# Patient Record
Sex: Female | Born: 1985 | Race: Black or African American | Hispanic: No | Marital: Single | Smoking: Never smoker
Health system: Southern US, Community
[De-identification: ages and names within clinical notes are randomized; demographics above are authoritative.]

## PROBLEM LIST (undated history)

## (undated) DIAGNOSIS — R87619 Unspecified abnormal cytological findings in specimens from cervix uteri: Secondary | ICD-10-CM

## (undated) DIAGNOSIS — R011 Cardiac murmur, unspecified: Secondary | ICD-10-CM

## (undated) DIAGNOSIS — R51 Headache: Secondary | ICD-10-CM

## (undated) DIAGNOSIS — O24419 Gestational diabetes mellitus in pregnancy, unspecified control: Secondary | ICD-10-CM

## (undated) HISTORY — DX: Headache: R51

## (undated) HISTORY — DX: Unspecified abnormal cytological findings in specimens from cervix uteri: R87.619

## (undated) HISTORY — DX: Cardiac murmur, unspecified: R01.1

## (undated) HISTORY — PX: DILATION AND CURETTAGE OF UTERUS: SHX78

---
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
## 2005-02-16 ENCOUNTER — Emergency Department: Payer: Self-pay | Admitting: Emergency Medicine

## 2005-04-17 ENCOUNTER — Ambulatory Visit: Payer: Self-pay

## 2005-10-18 ENCOUNTER — Emergency Department (HOSPITAL_COMMUNITY): Admission: EM | Admit: 2005-10-18 | Discharge: 2005-10-18 | Payer: Self-pay | Admitting: Emergency Medicine

## 2006-09-24 ENCOUNTER — Emergency Department (HOSPITAL_COMMUNITY): Admission: EM | Admit: 2006-09-24 | Discharge: 2006-09-25 | Payer: Self-pay | Admitting: Emergency Medicine

## 2006-09-25 IMAGING — US US OB COMP LESS 14 WK
1 series · 14 of 28 positions shown · non-contrast
Comparison: none

CLINICAL DATA: Abdominal pain, positive pregnancy test

TRANSABDOMINAL AND TRANSVAGINAL OB ULTRASOUND, LESS THAN 14 WEEKS:

[Series 1: unknown · 0.32mm/px · 14 of 42 slices shown]
[im 2/42]
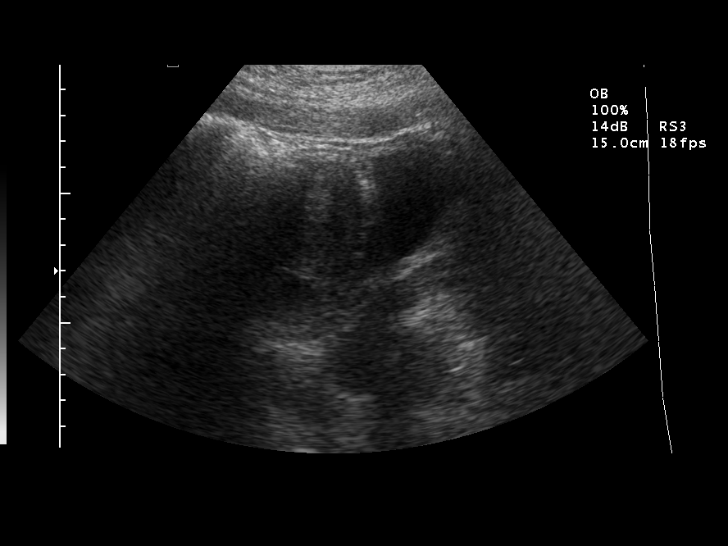
[im 5/42]
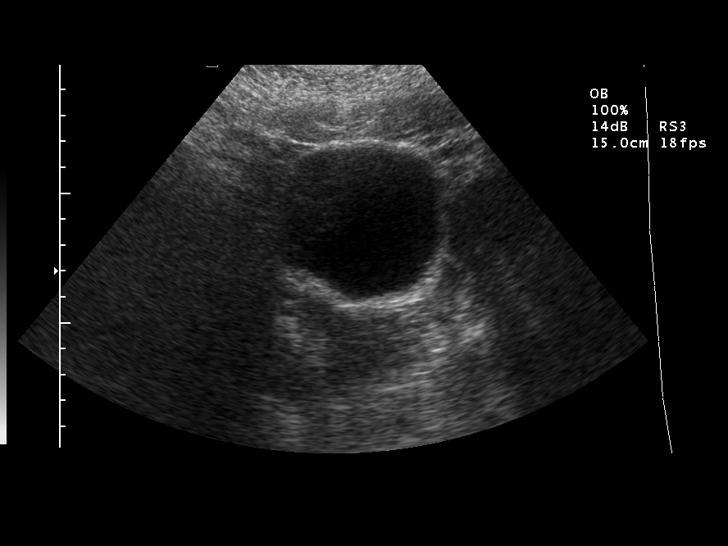
[im 8/42]
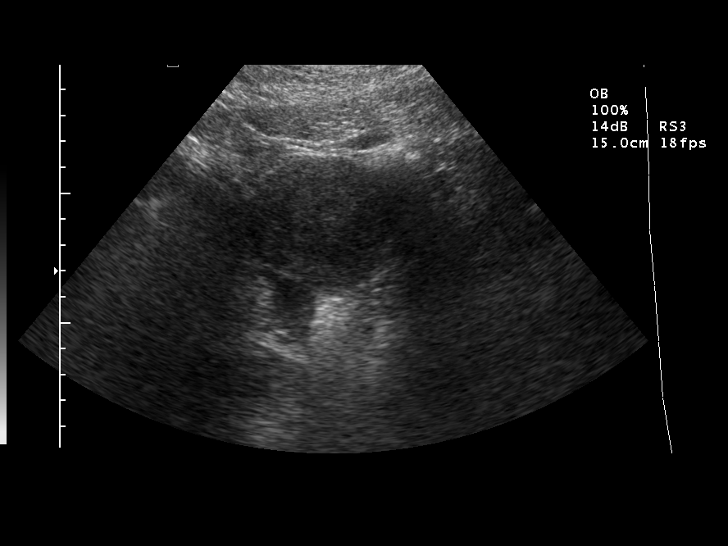
[im 11/42]
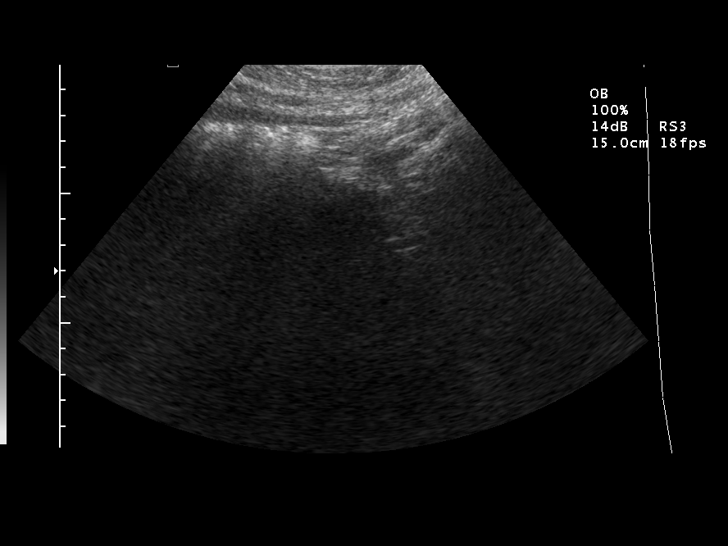
[im 14/42]
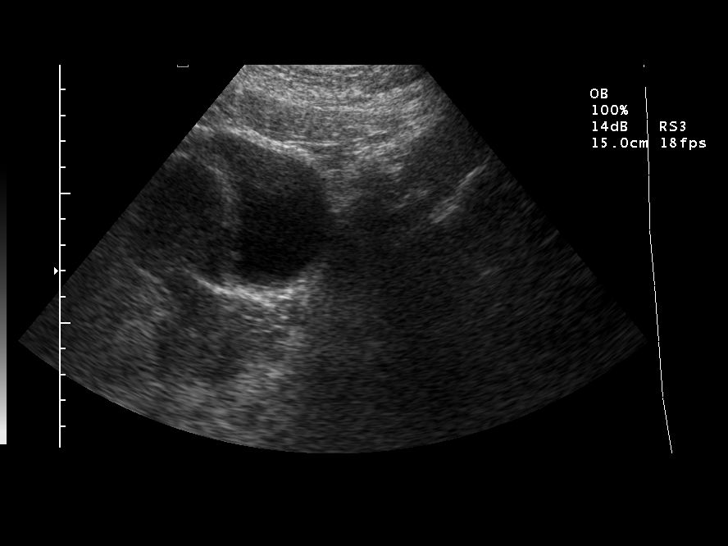
[im 17/42]
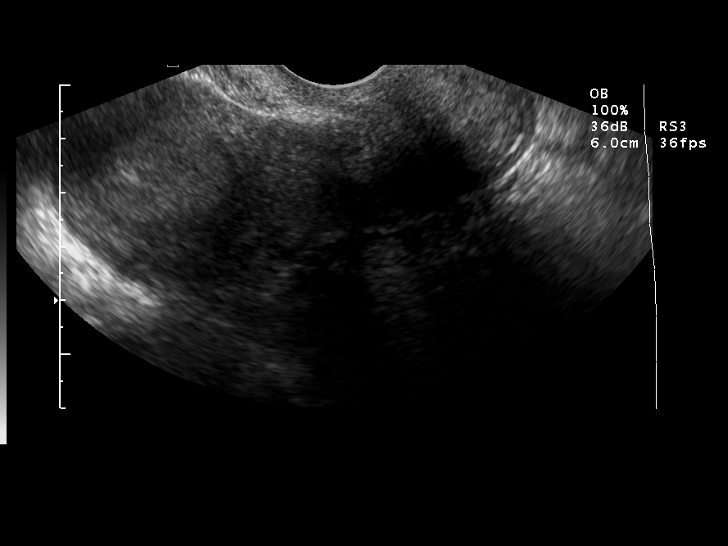
[im 20/42]
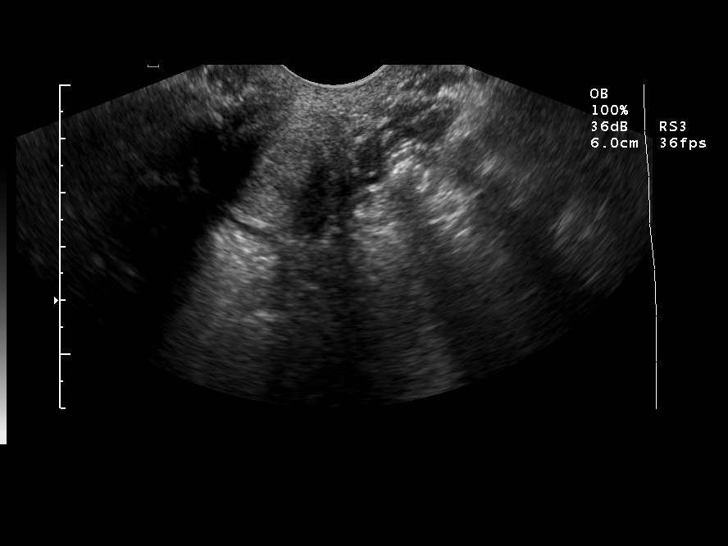
[im 23/42]
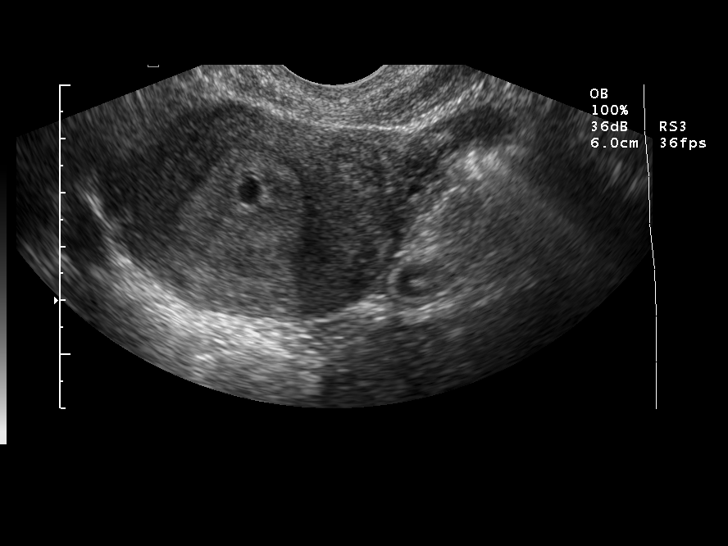
[im 26/42]
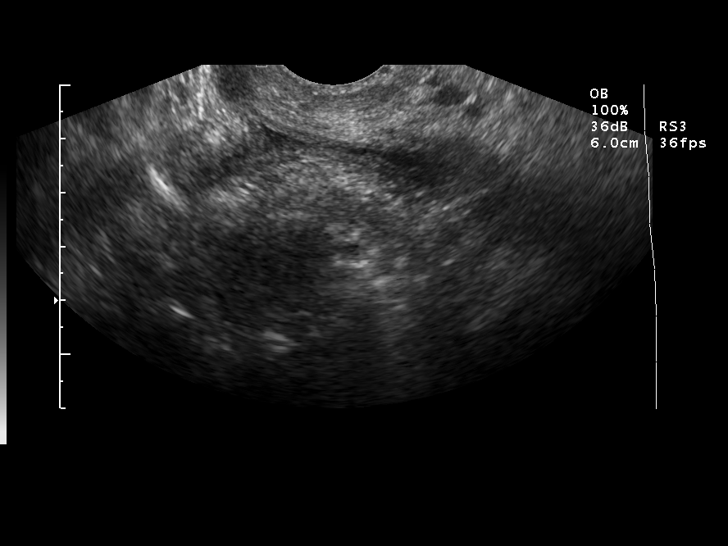
[im 29/42]
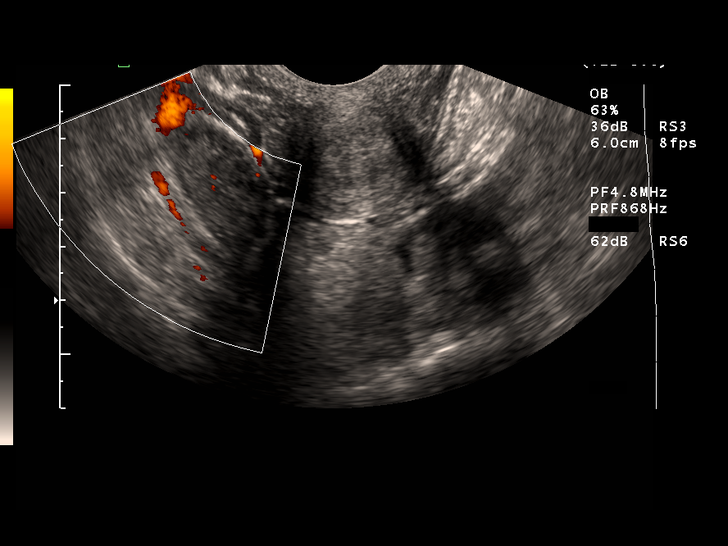
[im 32/42]
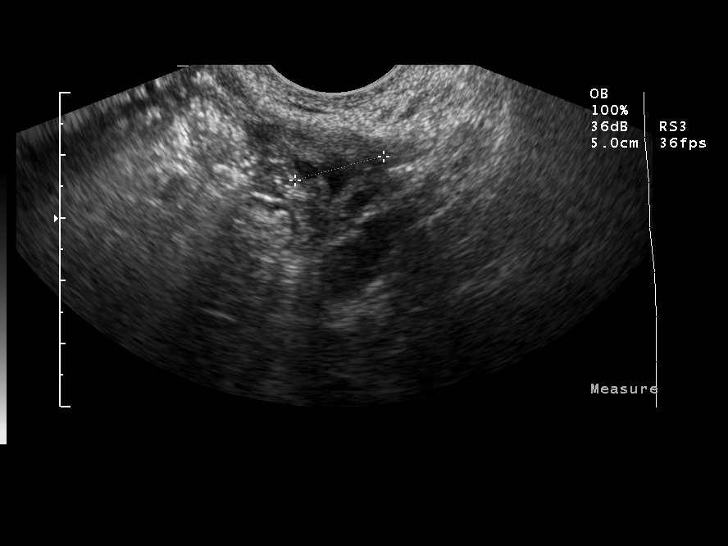
[im 35/42]
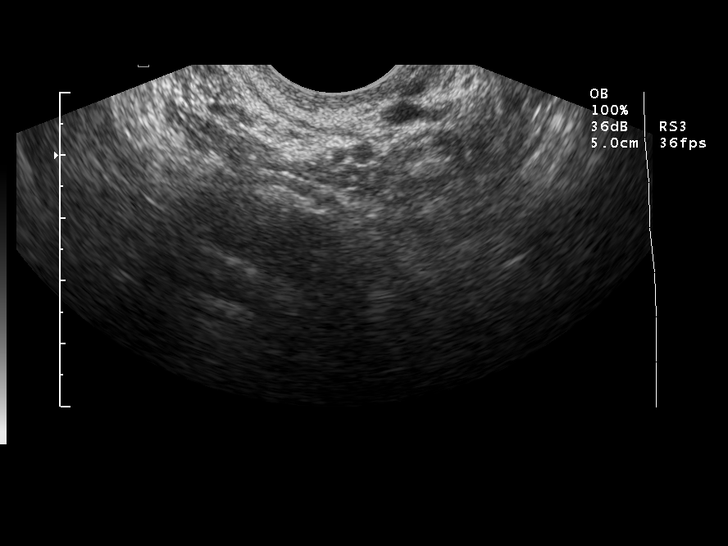
[im 38/42]
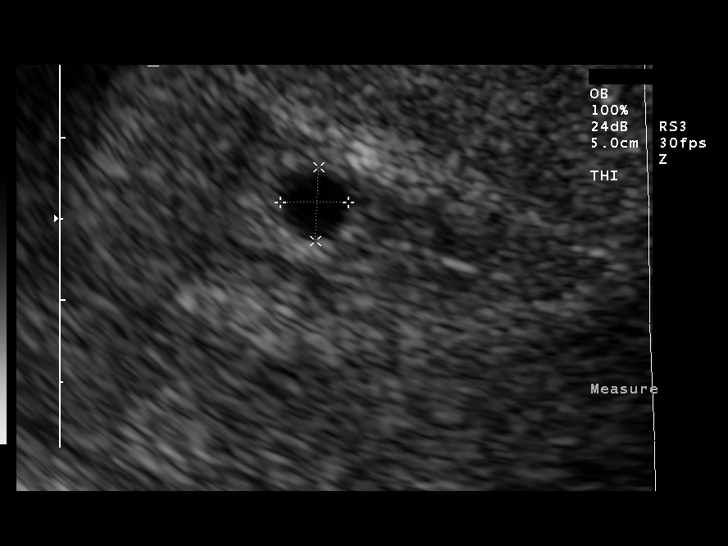
[im 42/42]
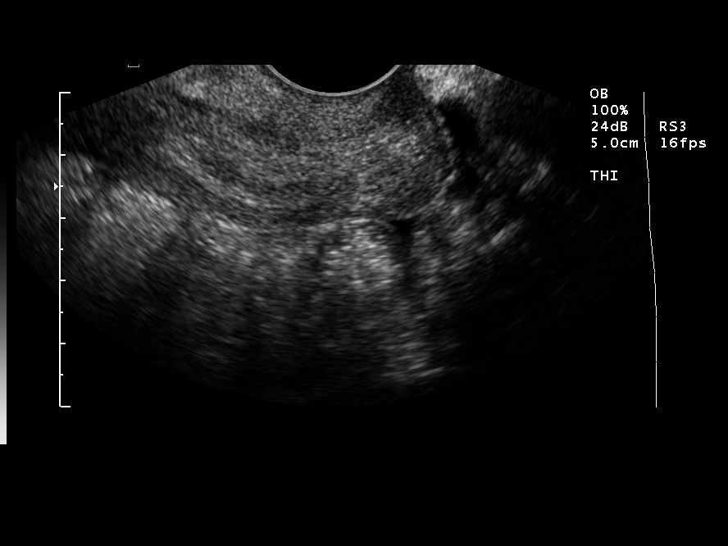

[14 of 28 positions shown; findings below may reference images not displayed]

FINDINGS: There is an early intrauterine pregnancy. Mean sac diameter is 4.4 mm
for an estimated gestational age of 5 weeks 1 day. Yolk sac is noted. No fetal
pole present at this time. 

Small left corpus luteum cyst. Small amount of free fluid in the pelvis.
IMPRESSION: Early intrauterine pregnancy with an estimated gestational age of 5 weeks one
day. Yolk sac present. No fetal pole.

## 2006-10-31 ENCOUNTER — Inpatient Hospital Stay (HOSPITAL_COMMUNITY): Admission: AD | Admit: 2006-10-31 | Discharge: 2006-11-01 | Payer: Self-pay | Admitting: Obstetrics and Gynecology

## 2006-11-01 IMAGING — US US OB TRANSVAGINAL
1 series · 14 of 28 positions shown · non-contrast
Comparison: none

CLINICAL DATA: 20-year-old female with pelvic pain.  Reported ultrasound in office with no fetal heart tones 4 days ago.  Estimated gestation of 10 weeks 3 days by prior ultrasound.
TRANSVAGINAL OBSTETRICAL US:
TECHNIQUE: Transvaginal ultrasound was performed for evaluation of the gestation as well as the maternal uterus and adnexal regions.

[Series 1: us ob transvaginal · 0.17mm/px · 39 acquisitions, 14 frames shown]
[im 2/39]
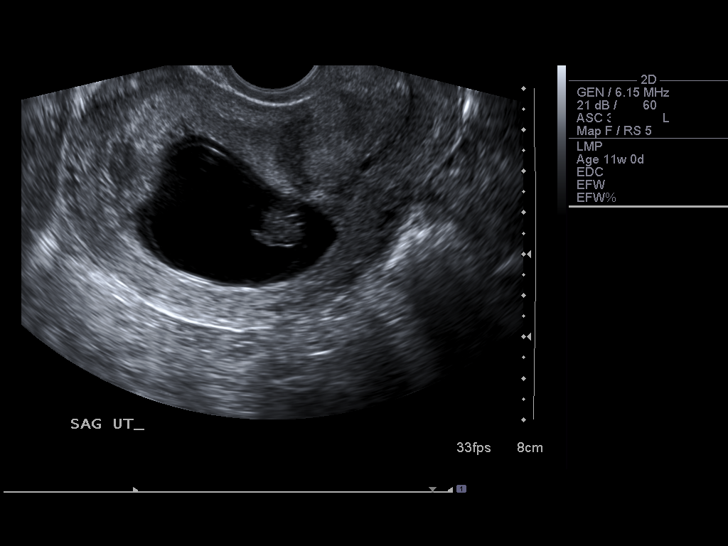
[im 5/39]
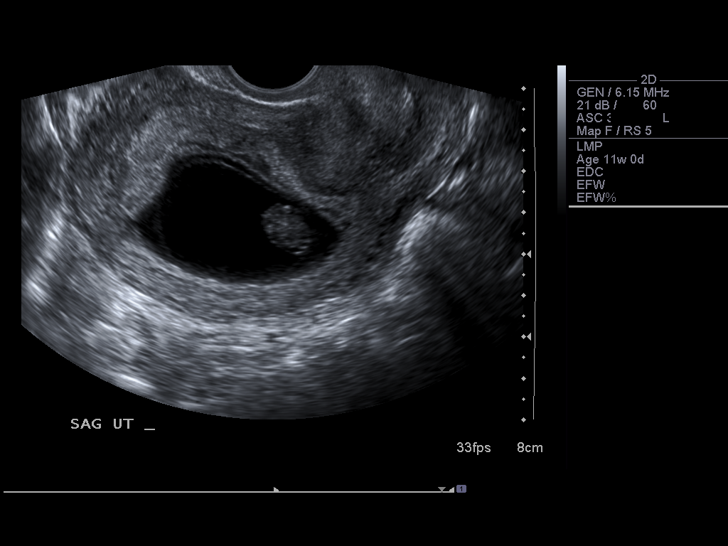
[im 8/39]
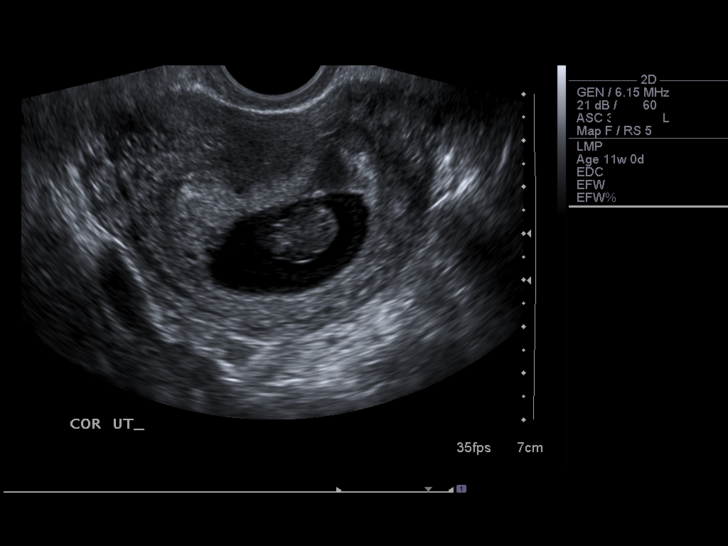
[im 10/39]
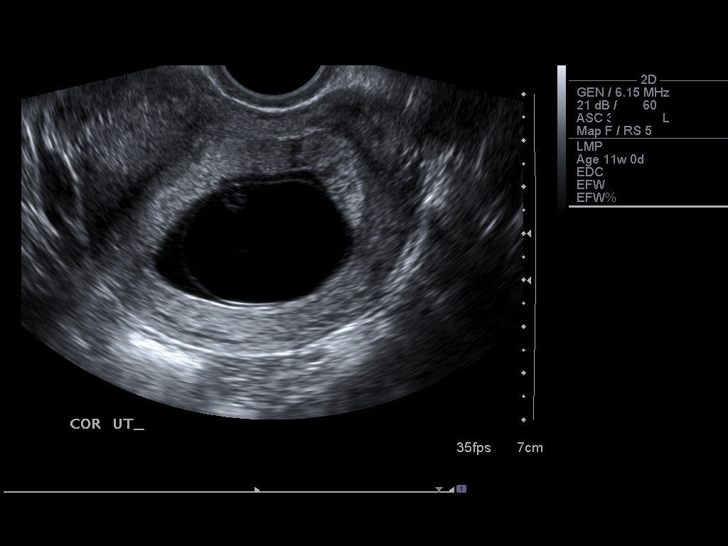
[im 13/39]
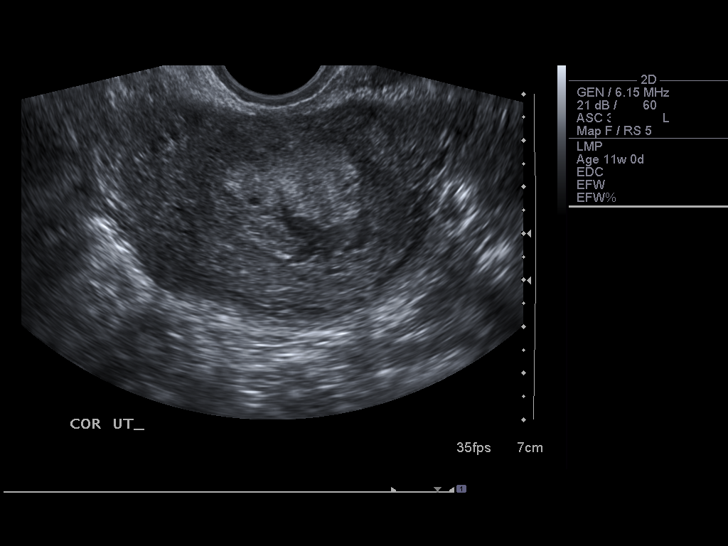
[im 16/39]
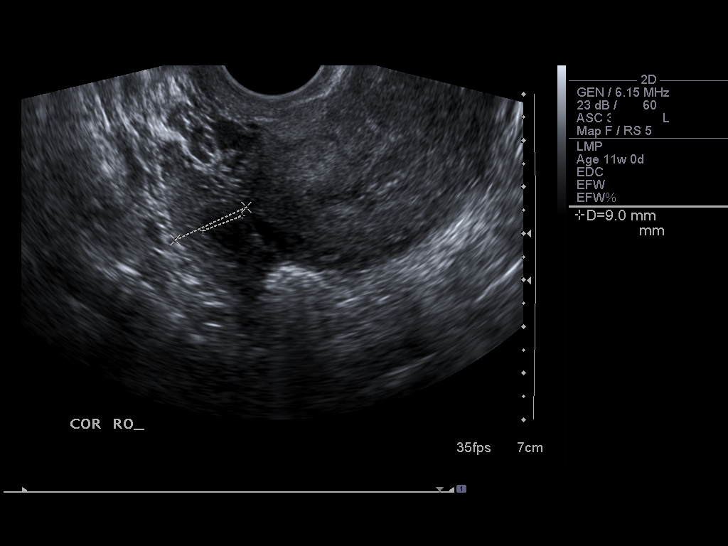
[im 19/39]
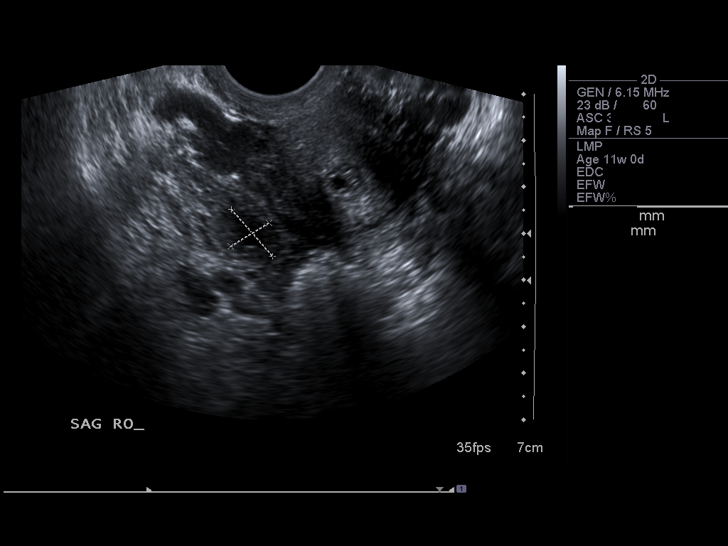
[im 22/39]
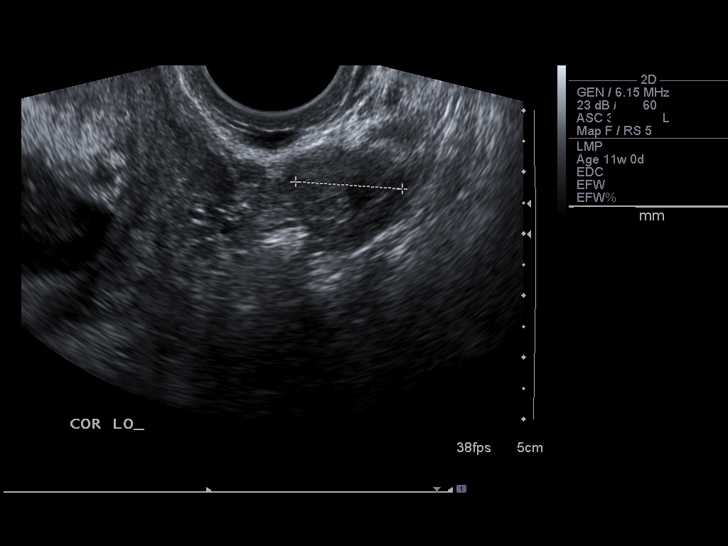
[im 24/39]
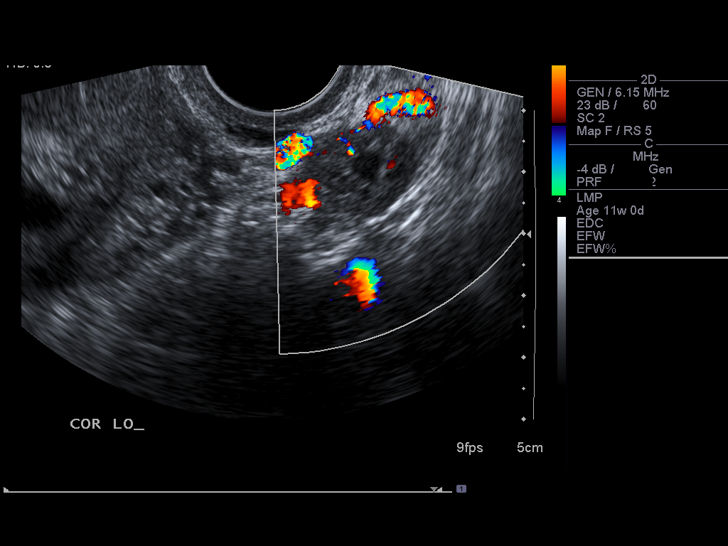
[im 27/39]
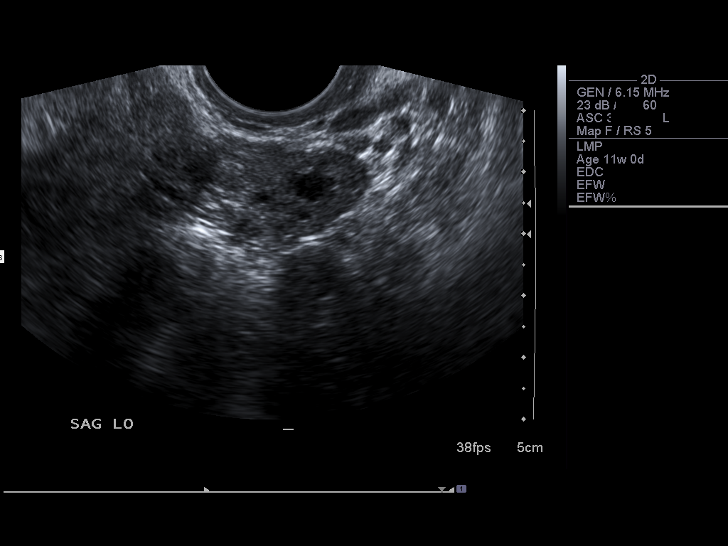
[im 30/39]
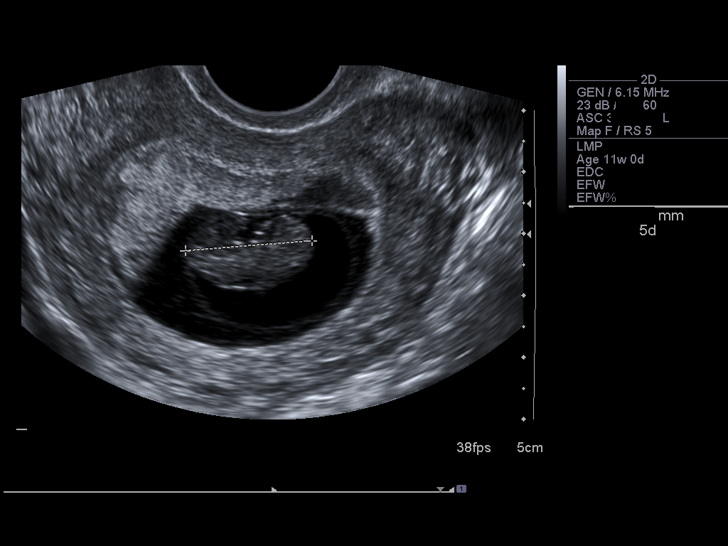
[im 33/39]
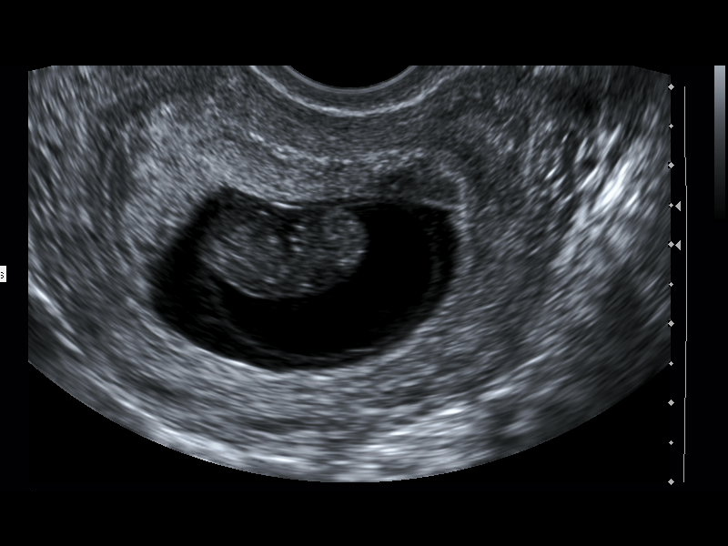
[im 36/39]
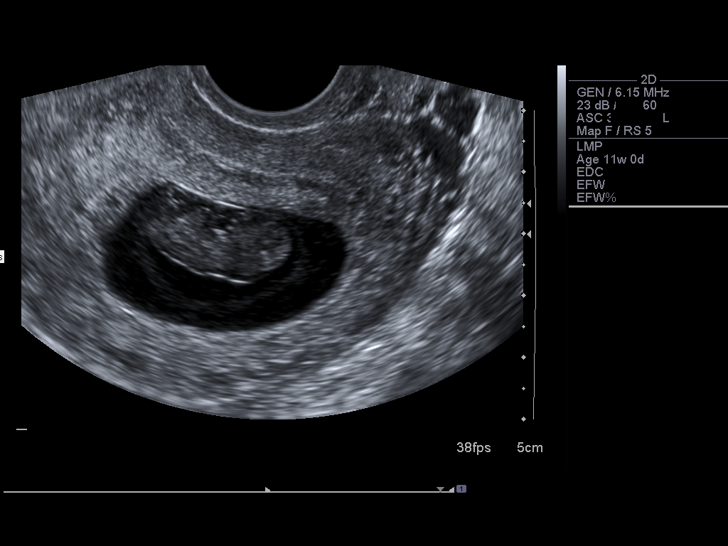
[im 39/39]
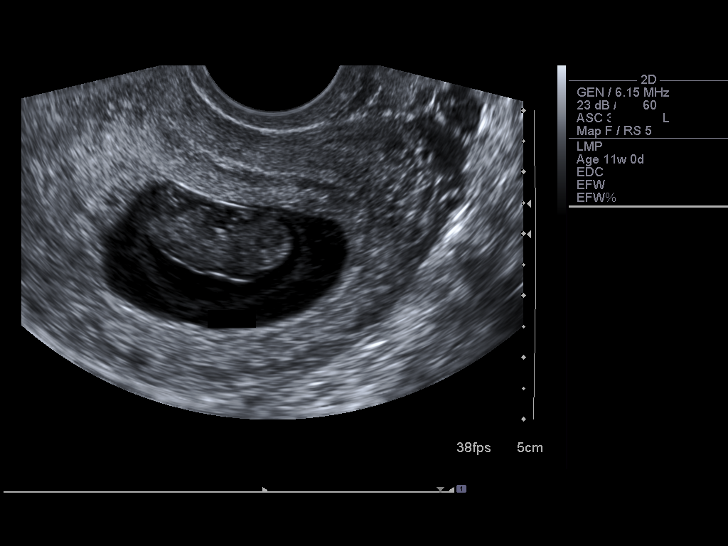

[14 of 28 positions shown; findings below may reference images not displayed]

FINDINGS: There is a single intrauterine gestational sac with yolk sac and fetal pole noted.  Crown-rump length measures 2.3 cm corresponding to a gestational age of 9 weeks 0 days.  However, there is no cardiac activity noted, compatible with fetal demise.  The ovaries are unremarkable.  No free fluid or adnexal masses.
IMPRESSION: Fetal demise.

## 2006-11-05 ENCOUNTER — Encounter (INDEPENDENT_AMBULATORY_CARE_PROVIDER_SITE_OTHER): Payer: Self-pay | Admitting: Specialist

## 2006-11-05 ENCOUNTER — Ambulatory Visit (HOSPITAL_COMMUNITY): Admission: RE | Admit: 2006-11-05 | Discharge: 2006-11-05 | Payer: Self-pay | Admitting: Obstetrics and Gynecology

## 2006-12-12 ENCOUNTER — Emergency Department (HOSPITAL_COMMUNITY): Admission: EM | Admit: 2006-12-12 | Discharge: 2006-12-12 | Payer: Self-pay | Admitting: Emergency Medicine

## 2007-01-31 ENCOUNTER — Encounter (INDEPENDENT_AMBULATORY_CARE_PROVIDER_SITE_OTHER): Payer: Self-pay | Admitting: *Deleted

## 2007-03-19 ENCOUNTER — Ambulatory Visit (HOSPITAL_COMMUNITY): Admission: RE | Admit: 2007-03-19 | Discharge: 2007-03-19 | Payer: Self-pay | Admitting: Cardiology

## 2007-03-19 ENCOUNTER — Encounter: Payer: Self-pay | Admitting: Cardiology

## 2007-08-03 ENCOUNTER — Emergency Department (HOSPITAL_COMMUNITY): Admission: EM | Admit: 2007-08-03 | Discharge: 2007-08-03 | Payer: Self-pay | Admitting: Emergency Medicine

## 2007-09-26 ENCOUNTER — Inpatient Hospital Stay (HOSPITAL_COMMUNITY): Admission: AD | Admit: 2007-09-26 | Discharge: 2007-09-28 | Payer: Self-pay | Admitting: Obstetrics and Gynecology

## 2007-09-29 ENCOUNTER — Encounter: Admission: RE | Admit: 2007-09-29 | Discharge: 2007-10-13 | Payer: Self-pay | Admitting: Obstetrics and Gynecology

## 2007-12-15 ENCOUNTER — Emergency Department (HOSPITAL_COMMUNITY): Admission: EM | Admit: 2007-12-15 | Discharge: 2007-12-16 | Payer: Self-pay | Admitting: Emergency Medicine

## 2008-05-10 ENCOUNTER — Emergency Department (HOSPITAL_COMMUNITY): Admission: EM | Admit: 2008-05-10 | Discharge: 2008-05-10 | Payer: Self-pay | Admitting: Emergency Medicine

## 2008-07-06 ENCOUNTER — Emergency Department (HOSPITAL_COMMUNITY): Admission: EM | Admit: 2008-07-06 | Discharge: 2008-07-06 | Payer: Self-pay | Admitting: Emergency Medicine

## 2008-11-04 ENCOUNTER — Emergency Department (HOSPITAL_COMMUNITY): Admission: EM | Admit: 2008-11-04 | Discharge: 2008-11-04 | Payer: Self-pay | Admitting: Emergency Medicine

## 2009-02-21 ENCOUNTER — Emergency Department (HOSPITAL_COMMUNITY): Admission: EM | Admit: 2009-02-21 | Discharge: 2009-02-21 | Payer: Self-pay | Admitting: Emergency Medicine

## 2009-02-21 IMAGING — CR DG ANKLE COMPLETE 3+V*R*
3 series · 3 of 3 positions shown · non-contrast
Comparison: [DATE]

CLINICAL DATA: The lateral ankle pain

RIGHT ANKLE - COMPLETE 3+ VIEW

[t ankle joint ap right]
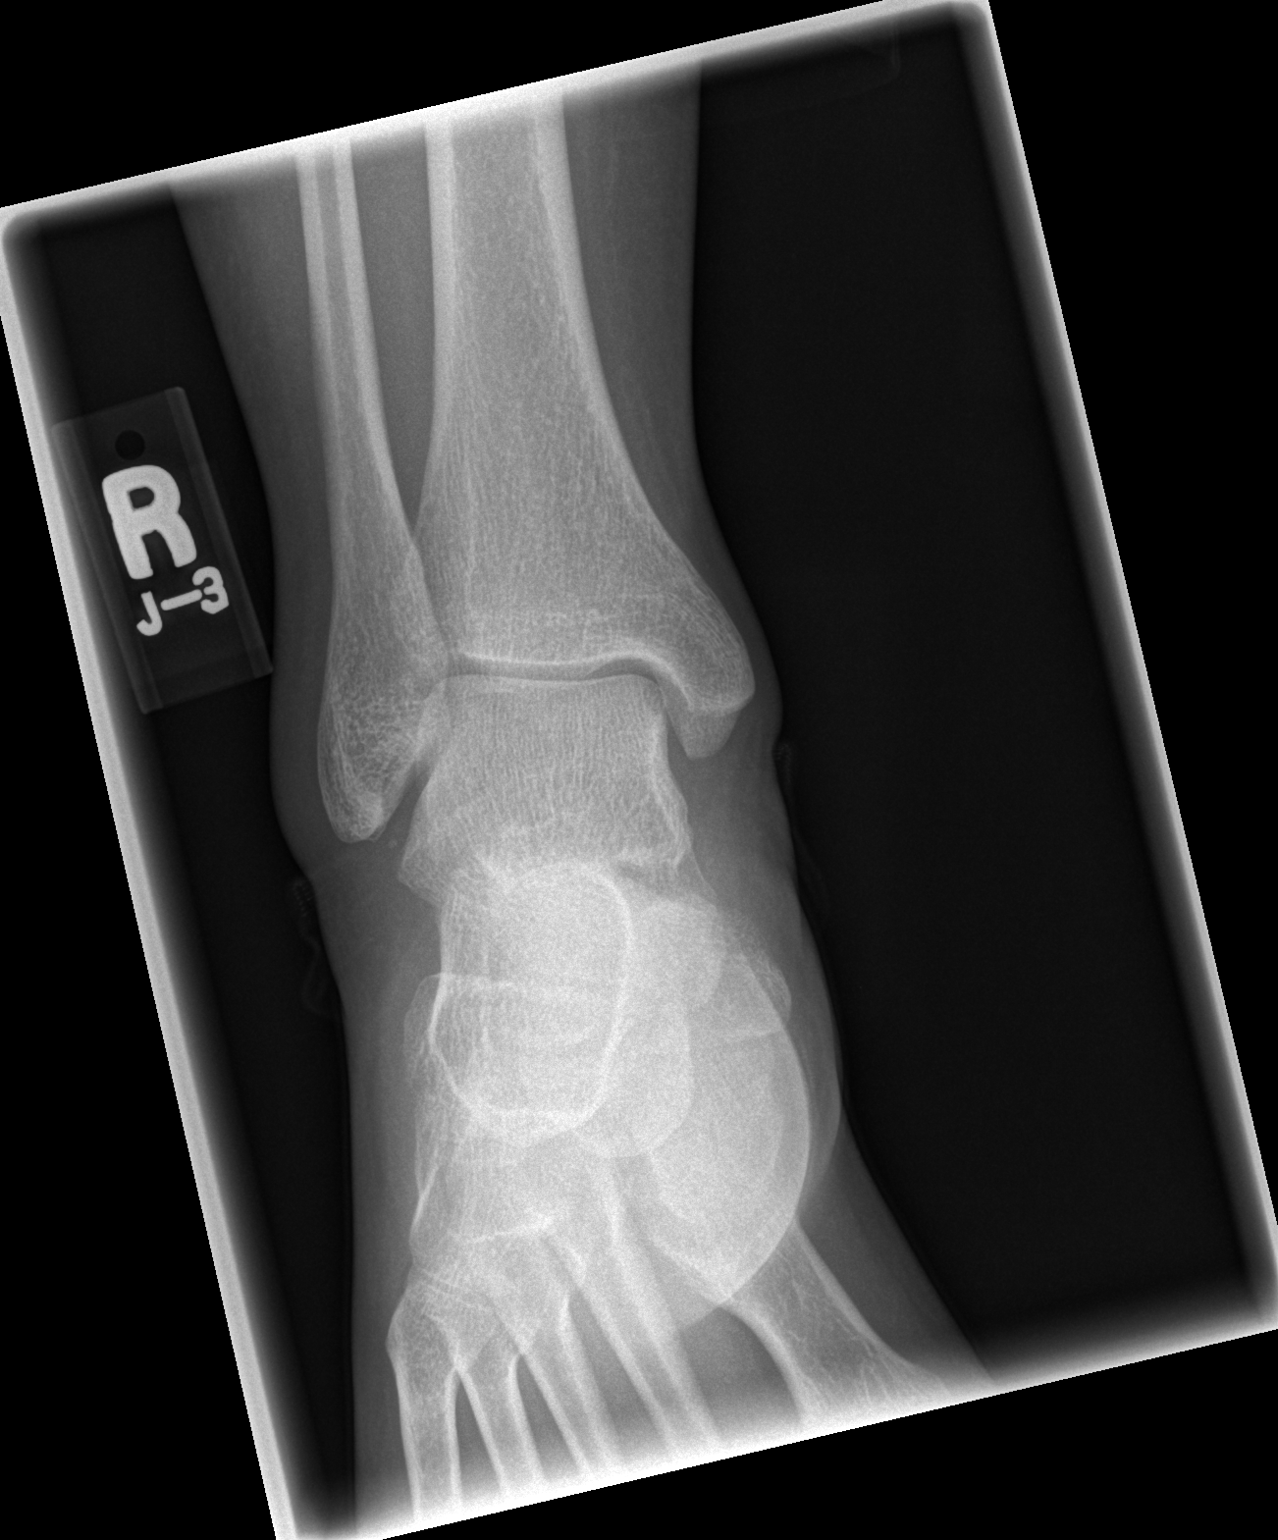

[t ankle joint oblique right]
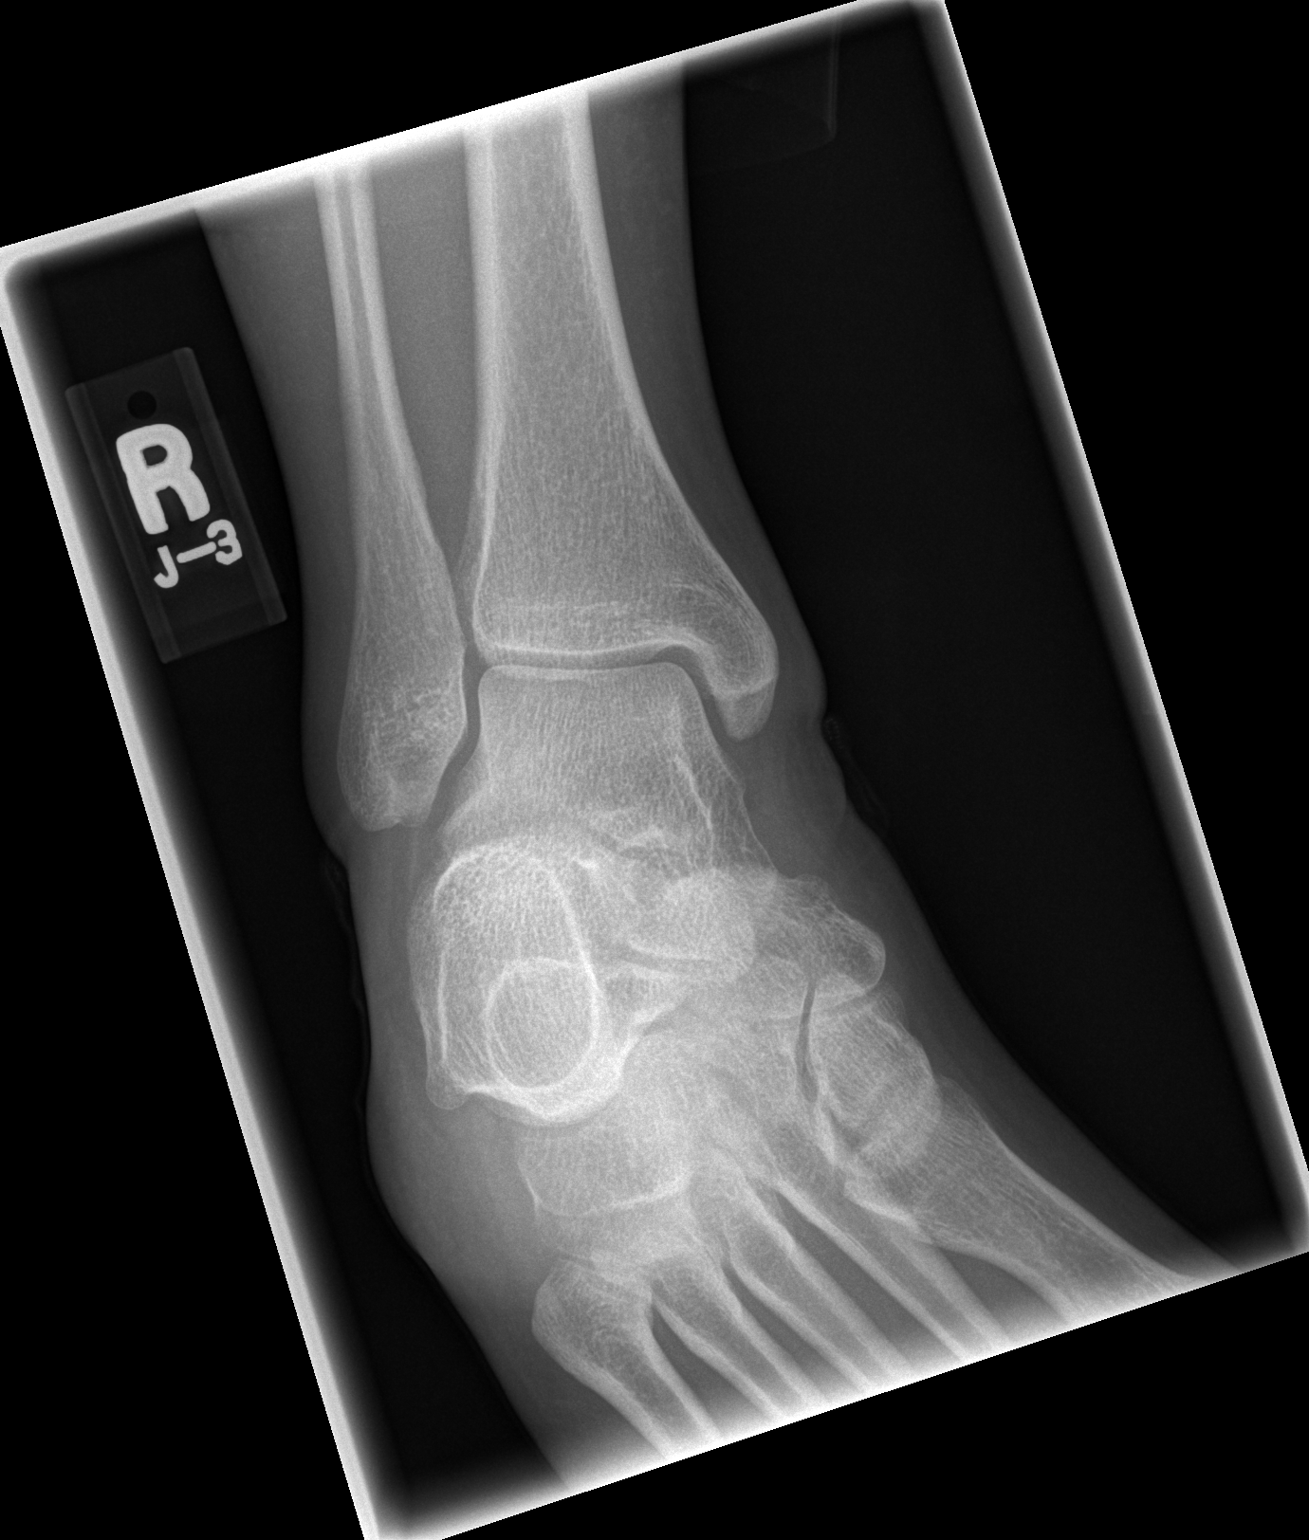

[t ankle joint lat right]
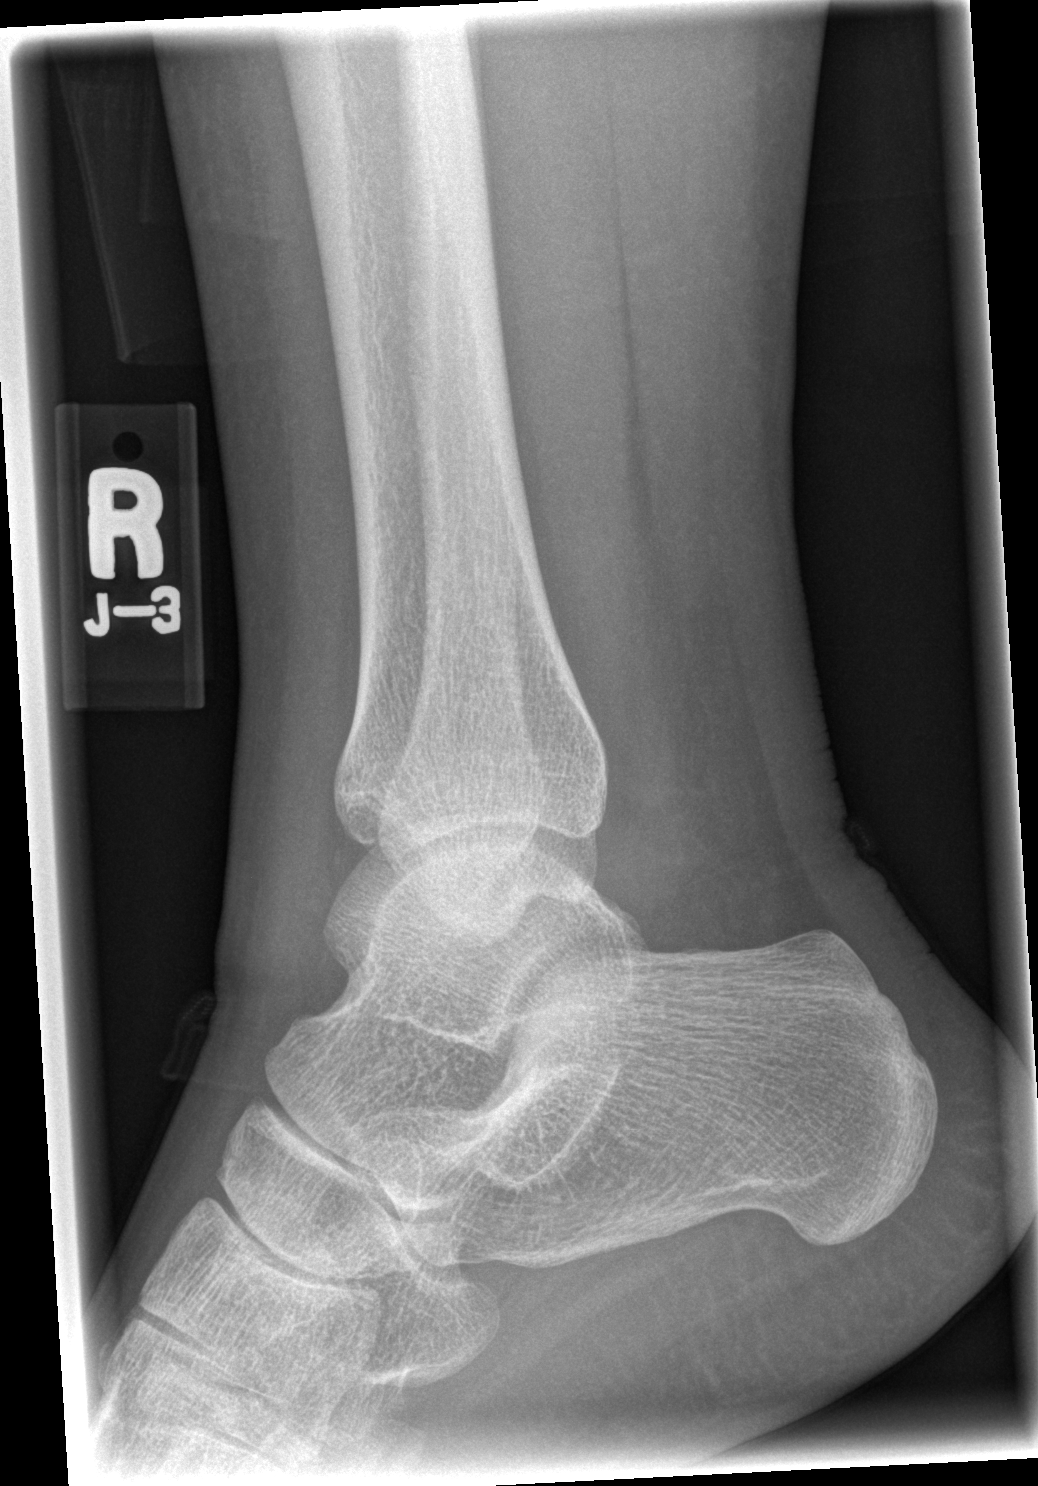

[3 of 3 positions shown; findings below may reference images not displayed]

FINDINGS: There is lateral soft tissue swelling.  No fracture or
dislocation.  There is a small accessory ossicle noted between the
tip of the fibula and the talus on the AP view.  This was present
previously.
IMPRESSION: 1.  Lateral soft tissue swelling.
2.  Small accessory ossicle noted between the tip of the fibula and
the talus as before.
3.  No acute findings or interval change.

## 2010-01-25 ENCOUNTER — Ambulatory Visit: Payer: Self-pay | Admitting: Internal Medicine

## 2010-02-03 ENCOUNTER — Emergency Department (HOSPITAL_COMMUNITY): Admission: EM | Admit: 2010-02-03 | Discharge: 2010-02-04 | Payer: Self-pay | Admitting: Emergency Medicine

## 2010-02-07 ENCOUNTER — Encounter: Admission: RE | Admit: 2010-02-07 | Discharge: 2010-03-01 | Payer: Self-pay | Admitting: Family Medicine

## 2011-04-20 NOTE — H&P (Signed)
NAMEEDUARDA, SCRIVENS NO.:  [id number]   MEDICAL RECORD NO.:  [id number]          PATIENT TYPE:  AMB   LOCATION:  [facility]                           FACILITY:  [facility]   PHYSICIAN:  Janine Limbo, M.D.DATE OF BIRTH:  [birth year]   DATE OF ADMISSION:  DATE OF DISCHARGE:                              HISTORY & PHYSICAL   HISTORY OF PRESENT ILLNESS:  Ms. [name] [name] is a 25 year old female, gravida  1, para 0, who presents at [redacted] weeks gestation (EDC is 05/23/2007).  The  patient has been followed at the [facility] and  [facility].  The patient has a  missed abortion.  She had an ultrasound performed on October 28, 2006  that showed a gestational sac that measured 9 weeks size, but no fetal  heart tones were seen.  A repeat ultrasound was performed 3 days later,  and similar findings were noted.  The patient wishes at this time to  proceed with dilatation and evacuation. She does have some spotting and  cramping.   DRUG ALLERGIES:  NO KNOWN DRUG ALLERGIES.   PAST MEDICAL HISTORY:  The patient denies hypertension and diabetes.  She did have surgery on her foot in 2005.   SOCIAL HISTORY:  The patient denies cigarette use, alcohol use and  recreational drug use.   REVIEW OF SYSTEMS:  Noncontributory.   FAMILY HISTORY:  There is a family history of chronic hypertension,  diabetes, and hypothyroidism.   PHYSICAL EXAMINATION:  VITAL SIGNS:  Weight is 137 pounds.  HEENT:  Within normal limits.  CHEST:  Clear.  HEART:  Regular rate and rhythm.  BREASTS:  Without masses.  ABDOMEN:  Nontender.  EXTREMITIES:  Grossly normal.  NEUROLOGIC:  Exam is grossly normal.  PELVIC:  External genitalia is normal.  Vagina is normal.  Cervix is  normal.  Uterus is 9-10 week size.  In the adnexa, no masses are  appreciated.   ASSESSMENT:  First trimester missed abortion.   PLAN:  The patient will undergo a dilatation and evacuation.   The  patient understands the indications for her surgical procedure, and she  accepts the risk of, but not limited to, anesthetic complications,  bleeding, infections and possible damage to the surrounding organs.      Janine Limbo, M.D.  Electronically Signed     AVS/MEDQ  D:  11/04/2006  T:  11/04/2006  Job:  [id number]

## 2011-04-20 NOTE — Op Note (Signed)
NAMEJAN, [name] [name]                   ACCOUNT NO.:  [id number]   MEDICAL RECORD NO.:  [id number]          PATIENT TYPE:  AMB   LOCATION:  [facility]C                           FACILITY:  [facility]   PHYSICIAN:  Janine Limbo, M.D.DATE OF BIRTH:  1986-08-01   DATE OF PROCEDURE:  11/05/2006  DATE OF DISCHARGE:                               OPERATIVE REPORT   PREOPERATIVE DIAGNOSIS:  First trimester missed abortion.   POSTOPERATIVE DIAGNOSIS:  First trimester missed abortion.   PROCEDURE:  Suction dilatation and evacuation.   SURGEON:  Janine Limbo, M.D.   ANESTHESIA:  Monitored anesthesia control, paracervical block using 0.5%  Marcaine with epinephrine.   DISPOSITION:  [name] is a 25 year old female, gravida 1, para 0, who  presents with a first trimester missed abortion.  The patient  understands the indications for her surgical procedure and she accepts  the risks of, but not limited to, anesthetic complications, bleeding,  infection, and possible damage to surrounding organs.   FINDINGS:  The patient's blood type is O+.  The patient had a ten week  size uterus.  A moderate amount of products of conception were removed  without difficulty.   DESCRIPTION OF PROCEDURE:  The patient was taken to the operating room  where she was given medication through her IV line.  The patient's  abdomen, perineum, and vagina were prepped with multiple layers of  Betadine.  The bladder was drained of urine.  Examination under  anesthesia was performed.  The patient was sterilely draped.  A  paracervical block was placed using 10 mL of 0.5% Marcaine with  epinephrine.  An additional 10 mL with 0.5% Marcaine with epinephrine  were injected directly into the cervix.  The uterus was sounded to 7.5  cm.  The cervix was gradually dilated.  The uterine cavity was then  evacuated using a size 8 suction curet followed by medium sharp curet.  The cavity was felt to be clean at the end of our procedure.   Hemostasis  was noted to be adequate.  The patient's examination was repeated and  she was noted to have an eight week size firm uterus.  She was awakened  from her anesthetic without difficulty.  She was taken to the recovery  room in stable condition.  Sponge, needle, instrument counts were  correct.   FOLLOW-UP INSTRUCTIONS:  The patient will return to see Dr. Stefano Gaul in  two weeks for follow-up examination.  She was given a prescription for  Vicodin and she will take 1-2 tablets every 4 hours as needed for  pain.  She can take ibuprofen 600 mg every 6 hours as needed for mild to  moderate pain.  The patient will call with questions or concerns.  She  was given a copy of the postoperative instruction sheet as prepared by  the [facility] for patients who have undergone a  dilatation and curettage.      Janine Limbo, M.D.  Electronically Signed     AVS/MEDQ  D:  11/05/2006  T:  11/05/2006  Job:  [id number] 

## 2011-08-22 LAB — RAPID STREP SCREEN (MED CTR MEBANE ONLY): Streptococcus, Group A Screen (Direct): NEGATIVE

## 2011-08-30 LAB — RAPID STREP SCREEN (MED CTR MEBANE ONLY): Streptococcus, Group A Screen (Direct): NEGATIVE

## 2011-08-31 LAB — WET PREP, GENITAL: Trich, Wet Prep: NONE SEEN

## 2011-08-31 LAB — GC/CHLAMYDIA PROBE AMP, GENITAL
Chlamydia, DNA Probe: NEGATIVE
GC Probe Amp, Genital: NEGATIVE

## 2011-08-31 LAB — RPR: RPR Ser Ql: NONREACTIVE

## 2011-09-07 LAB — POCT URINALYSIS DIP (DEVICE)
Bilirubin Urine: NEGATIVE
Glucose, UA: NEGATIVE mg/dL
Hgb urine dipstick: NEGATIVE
Specific Gravity, Urine: 1.025 (ref 1.005–1.030)
Urobilinogen, UA: 1 mg/dL (ref 0.0–1.0)
pH: 7 (ref 5.0–8.0)

## 2011-09-12 LAB — CBC
Hemoglobin: 10.1 — ABNORMAL LOW
Hemoglobin: 12.6
MCV: 83.6
Platelets: 168
RDW: 14.6 — ABNORMAL HIGH
WBC: 11.5 — ABNORMAL HIGH

## 2011-09-12 LAB — RPR: RPR Ser Ql: NONREACTIVE

## 2011-09-28 ENCOUNTER — Emergency Department (HOSPITAL_COMMUNITY)
Admission: EM | Admit: 2011-09-28 | Discharge: 2011-09-28 | Disposition: A | Discharge status: Home / Self Care | Payer: No Typology Code available for payment source | Attending: Emergency Medicine | Admitting: Emergency Medicine

## 2011-09-28 DIAGNOSIS — T1490XA Injury, unspecified, initial encounter: Secondary | ICD-10-CM | POA: Insufficient documentation

## 2011-09-28 DIAGNOSIS — Y92410 Unspecified street and highway as the place of occurrence of the external cause: Secondary | ICD-10-CM | POA: Insufficient documentation

## 2011-09-28 DIAGNOSIS — S335XXA Sprain of ligaments of lumbar spine, initial encounter: Secondary | ICD-10-CM | POA: Insufficient documentation

## 2012-03-26 ENCOUNTER — Ambulatory Visit (INDEPENDENT_AMBULATORY_CARE_PROVIDER_SITE_OTHER): Payer: BC Managed Care – PPO | Admitting: Family Medicine

## 2012-03-26 ENCOUNTER — Ambulatory Visit: Payer: BC Managed Care – PPO

## 2012-03-26 VITALS — BP 121/68 | HR 78 | Temp 98.5°F | Resp 16 | Ht 61.5 in | Wt 153.4 lb

## 2012-03-26 DIAGNOSIS — R109 Unspecified abdominal pain: Secondary | ICD-10-CM

## 2012-03-26 DIAGNOSIS — R1010 Upper abdominal pain, unspecified: Secondary | ICD-10-CM

## 2012-03-26 IMAGING — CR DG ABDOMEN ACUTE W/ 1V CHEST
3 series · 3 of 3 positions shown · non-contrast
Comparison: None.

CLINICAL DATA: Left upper quadrant abdominal pain.

ACUTE ABDOMEN SERIES (ABDOMEN 2 VIEW & CHEST 1 VIEW)

[PA]
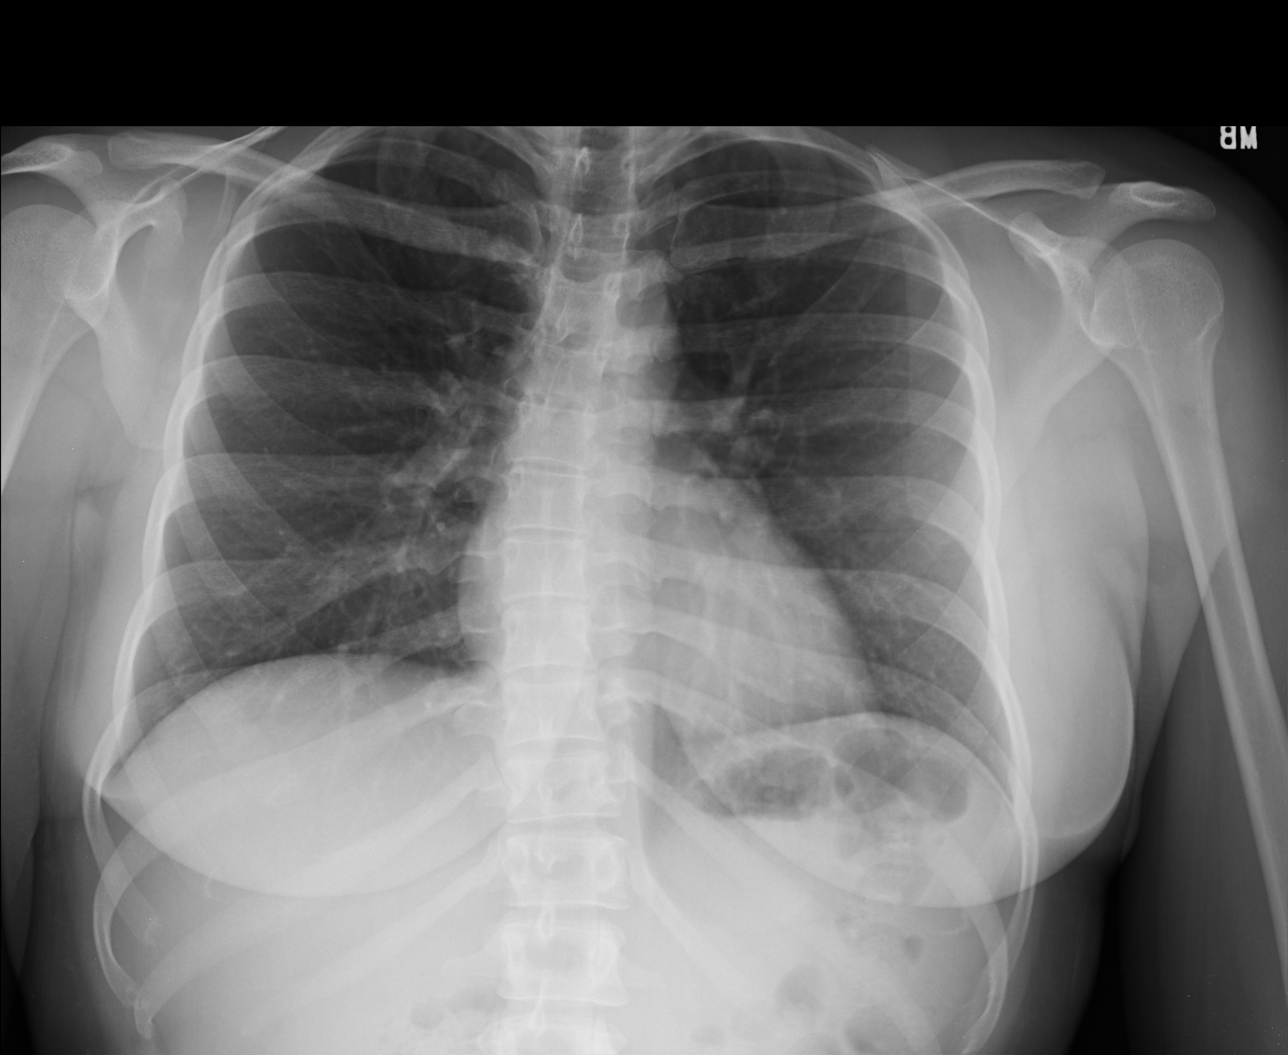

[AP (1 of 2)]
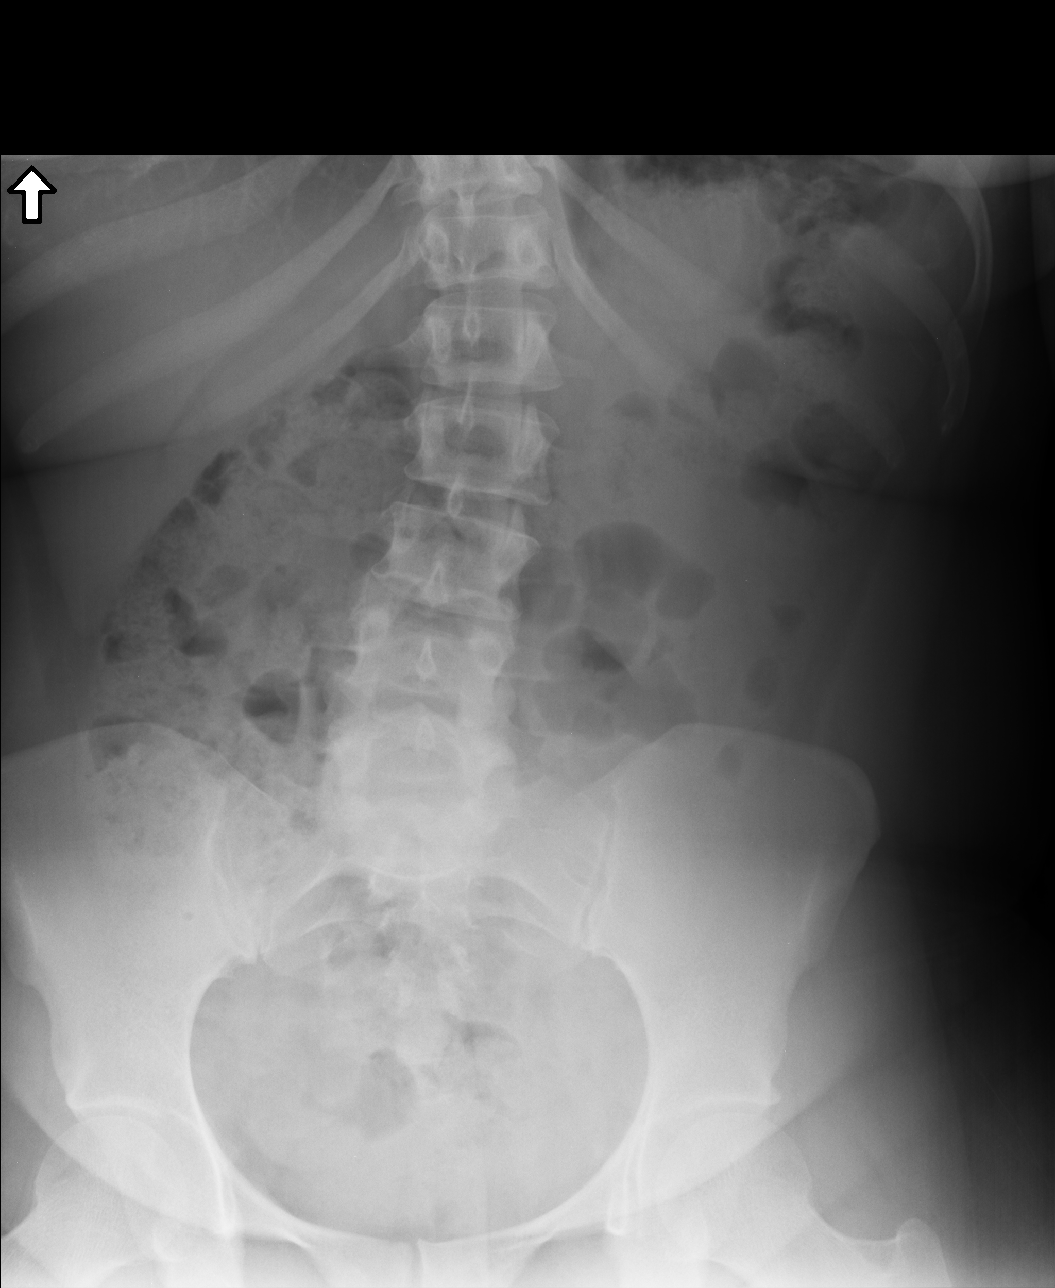

[AP (2 of 2)]
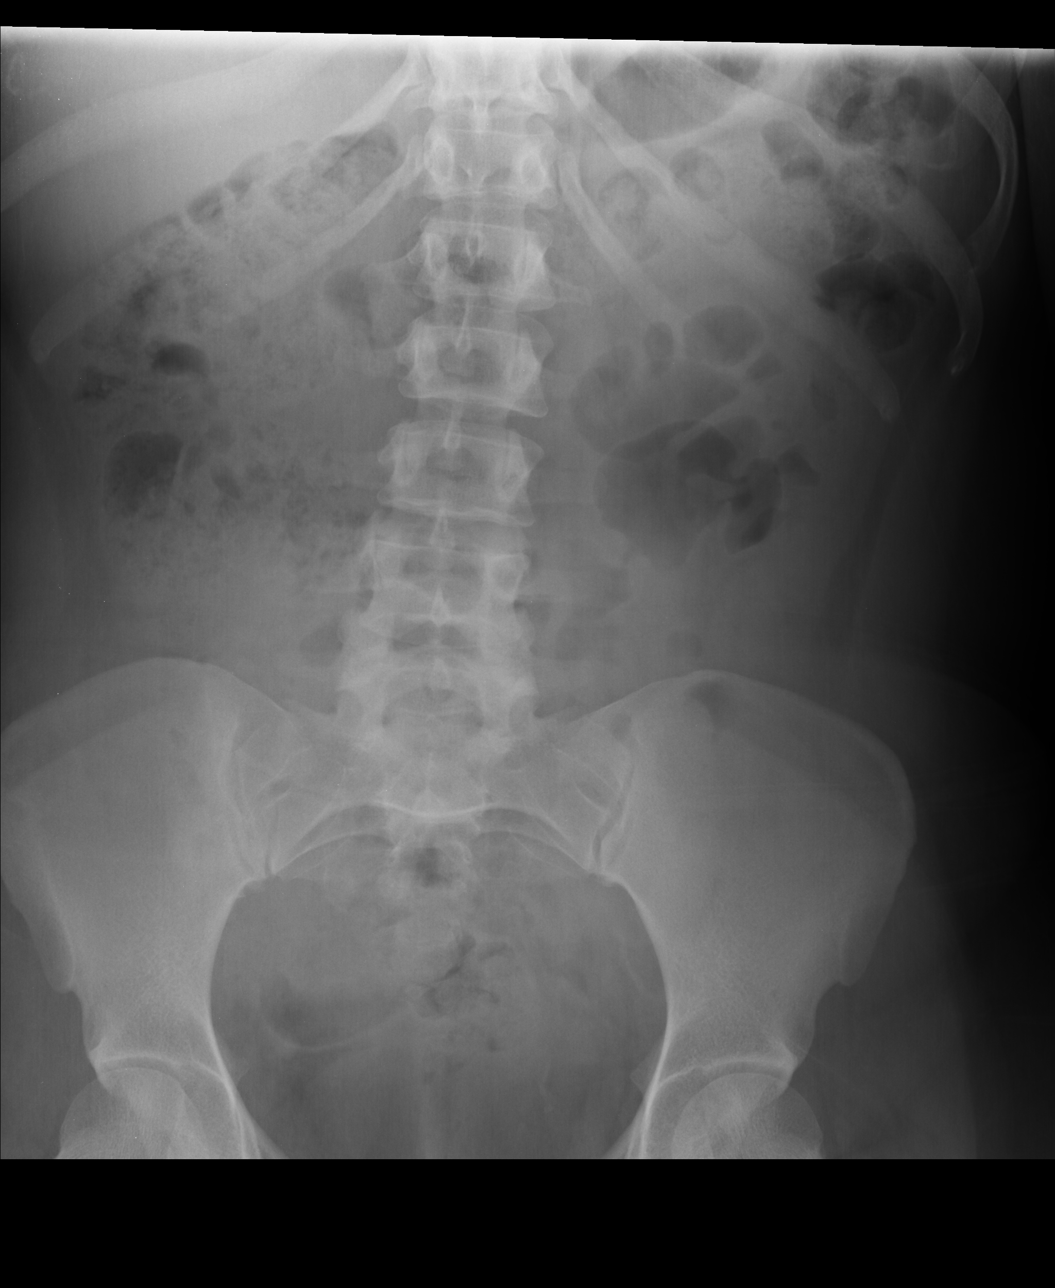

[3 of 3 positions shown; findings below may reference images not displayed]

FINDINGS: The heart size is normal.  The lungs are clear.
Rightward curvature is present in the thoracic spine.  There is
leftward curvature in the upper lumbar spine.  Moderate stool is
present in the ascending and transverse colon.  There is gas in the
distal colon.  No obstruction is present.  There is no free air.
IMPRESSION: 1.  Moderate stool in the ascending and transverse colon.
2.  No obstruction or free air.
3.  Scoliosis.
4.  No acute cardiopulmonary disease.

Clinically significant discrepancy from primary report, if
provided: None

## 2012-03-26 NOTE — Patient Instructions (Signed)
Increase your FLUIDS (At least 64 ounces of water daily), FIBER (whole grains, fruits and vegetables), and FITNESS (get some aerobic exercise daily-even just walking!).  You may use an over-the-counter product containing simethicone (Mylanta Gas is one) to help reduce the discomfort.

## 2012-03-26 NOTE — Progress Notes (Signed)
  Subjective:    Patient ID: [name] [name], female    DOB: [birth year], 26 y.o.   MRN: [id number]  HPI  Yesterday mild (5/10) pain in the epigastrum, comes and goes.  Continues today.  No alleviating or aggravating factors except sitting for prolonged periods (getting up helps somewhat). Tried to have a BM this morning, but couldn't. Last BM yesterday evening. No change in discomfort with eating. No nausea. No heartburn.  Some increased belching.  LMP 03/22/2012.  Consistent condom use.  Review of Systems No fever, chills. No urinary symptoms.  No myalgias, arthralgias, rash.    Objective:   Physical Exam  Vital signs noted. Well-developed, well nourished BF who is awake, alert and oriented, in NAD. HEENT: [place]/AT, sclera and conjunctiva are clear.   Neck: supple, non-tender, no lymphadenopathy, thyromegaly. Heart: RRR, no murmur Lungs: CTA Abdomen: normo-active bowel sounds, supple, mildly tender just to the left of the epigastrum, no mass or organomegaly. Extremities: no cyanosis, clubbing or edema. Skin: warm and dry without rash.   Acute Abdominal Series: [facility] reading (PRIMARY) by  Dr. Conley Rolls. Lumbar scoliosis is incidental finding.  Large air in splenic flexure, large stool volume in the ascending colon.     Assessment & Plan:   1. Pain of upper abdomen  DG Abd Acute W/Chest   Increase dietary fiber, fluids and increase physical activity.  Use OTC product containing simethicone if desired.  Re-evaluate if symptoms worsen/persist.

## 2012-08-23 ENCOUNTER — Emergency Department (HOSPITAL_COMMUNITY)
Admission: EM | Admit: 2012-08-23 | Discharge: 2012-08-24 | Disposition: A | Discharge status: Home / Self Care | Payer: BC Managed Care – PPO | Attending: Emergency Medicine | Admitting: Emergency Medicine

## 2012-08-23 ENCOUNTER — Encounter (HOSPITAL_COMMUNITY): Payer: Self-pay | Admitting: *Deleted

## 2012-08-23 DIAGNOSIS — R609 Edema, unspecified: Secondary | ICD-10-CM

## 2012-08-23 DIAGNOSIS — M7989 Other specified soft tissue disorders: Secondary | ICD-10-CM | POA: Insufficient documentation

## 2012-08-23 DIAGNOSIS — R209 Unspecified disturbances of skin sensation: Secondary | ICD-10-CM | POA: Insufficient documentation

## 2012-08-23 NOTE — ED Notes (Signed)
Pt states yesterday she began having low back pain,  Leg swelling and numbness no known injury,  Pt is alert and oriented in NAD,  Drove self to emergency room,  , pt states she also worked today and is on feet all day.

## 2012-08-24 LAB — COMPREHENSIVE METABOLIC PANEL
AST: 19 U/L (ref 0–37)
BUN: 10 mg/dL (ref 6–23)
Chloride: 104 mEq/L (ref 96–112)
Potassium: 3.6 mEq/L (ref 3.5–5.1)
Sodium: 139 mEq/L (ref 135–145)
Total Bilirubin: 0.3 mg/dL (ref 0.3–1.2)

## 2012-08-24 LAB — URINALYSIS, ROUTINE W REFLEX MICROSCOPIC
Bilirubin Urine: NEGATIVE
Glucose, UA: NEGATIVE mg/dL
Ketones, ur: NEGATIVE mg/dL
Nitrite: NEGATIVE
Specific Gravity, Urine: 1.03 (ref 1.005–1.030)
Urobilinogen, UA: 1 mg/dL (ref 0.0–1.0)
pH: 6 (ref 5.0–8.0)

## 2012-08-24 NOTE — ED Notes (Signed)
Pt verbalizes understanding 

## 2012-08-24 NOTE — ED Provider Notes (Signed)
History     CSN: [id number]  Arrival date & time 08/23/12  2008   First MD Initiated Contact with Patient 08/24/12 0144      Chief Complaint  Patient presents with  . Leg Swelling  . Numbness  . Back Pain    (Consider location/radiation/quality/duration/timing/severity/associated sxs/prior treatment) HPI HX per PT. Last night while sleeping noticed numbness over bilateral shins and then today having bilat ankle swelling, states she worked on her feet all day and denies h/o same. No calf pain or swelling, no o h/o dvt. No SOB. No F/C. No recent travel, no trauma. Gets occ LBP but sig pain recently. LMP a week ago. No h/o same. No medications.   History reviewed. No pertinent past medical history.  Past Surgical History  Procedure Date  . Bunionectomy 2005    Family History  Problem Relation Age of Onset  . Hypertension Mother   . Thyroid disease Mother   . Hypertension Maternal Grandmother     History  Substance Use Topics  . Smoking status: Never Smoker   . Smokeless tobacco: Not on file  . Alcohol Use: No    OB History    Grav Para Term Preterm Abortions TAB SAB Ect Mult Living                  Review of Systems  Constitutional: Negative for fever and chills.  HENT: Negative for neck pain and neck stiffness.   Eyes: Negative for pain.  Respiratory: Negative for shortness of breath.   Cardiovascular: Negative for chest pain.  Gastrointestinal: Negative for abdominal pain.  Genitourinary: Negative for dysuria.  Musculoskeletal: Negative for joint swelling and gait problem.  Skin: Negative for rash.  Neurological: Negative for headaches.  All other systems reviewed and are negative.    Allergies  Review of patient's allergies indicates no known allergies.  Home Medications  No current outpatient prescriptions on file.  BP 123/72  Pulse 81  Temp 98.6 F (37 C) (Oral)  Resp 18  Ht 5\' 2"  (1.575 m)  Wt 165 lb 1.6 oz (74.889 kg)  BMI 30.20 kg/m2   SpO2 100%  LMP 08/18/2012  Physical Exam  Constitutional: She is oriented to person, place, and time. She appears well-developed and well-nourished.  HENT:  Head: Normocephalic and atraumatic.  Eyes: Conjunctivae normal and EOM are normal. Pupils are equal, round, and reactive to light.  Neck: Trachea normal. Neck supple. No thyromegaly present.       No cervical spine tenderness  Cardiovascular: Normal rate, regular rhythm, S1 normal, S2 normal and normal pulses.     No systolic murmur is present   No diastolic murmur is present  Pulses:      Radial pulses are 2+ on the right side, and 2+ on the left side.  Pulmonary/Chest: Effort normal and breath sounds normal. She has no wheezes. She has no rhonchi. She has no rales. She exhibits no tenderness.  Abdominal: Soft. Normal appearance and bowel sounds are normal. There is no tenderness. There is no CVA tenderness and negative Murphy's sign.  Musculoskeletal:       No lumbar or thoracic tenderness.   Sensation to light intact x 4 throughout. Minimal bilateral pretibial edema. BLE:s Calves nontender, no cords or erythema, negative Homans sign  Neurological: She is alert and oriented to person, place, and time. She has normal strength. No cranial nerve deficit or sensory deficit. GCS eye subscore is 4. GCS verbal subscore is 5. GCS motor subscore  is 6.  Skin: Skin is warm and dry. No rash noted. She is not diaphoretic.  Psychiatric: Her speech is normal.       Cooperative and appropriate    ED Course  Procedures (including critical care time)  Results for orders placed during the hospital encounter of 08/23/12  URINALYSIS, ROUTINE W REFLEX MICROSCOPIC      Component Value Range   Color, Urine YELLOW  YELLOW   APPearance CLOUDY (*) CLEAR   Specific Gravity, Urine 1.030  1.005 - 1.030   pH 6.0  5.0 - 8.0   Glucose, UA NEGATIVE  NEGATIVE mg/dL   Hgb urine dipstick NEGATIVE  NEGATIVE   Bilirubin Urine NEGATIVE  NEGATIVE   Ketones, ur  NEGATIVE  NEGATIVE mg/dL   Protein, ur NEGATIVE  NEGATIVE mg/dL   Urobilinogen, UA 1.0  0.0 - 1.0 mg/dL   Nitrite NEGATIVE  NEGATIVE   Leukocytes, UA NEGATIVE  NEGATIVE  PREGNANCY, URINE      Component Value Range   Preg Test, Ur NEGATIVE  NEGATIVE  COMPREHENSIVE METABOLIC PANEL      Component Value Range   Sodium 139  135 - 145 mEq/L   Potassium 3.6  3.5 - 5.1 mEq/L   Chloride 104  96 - 112 mEq/L   CO2 25  19 - 32 mEq/L   Glucose, Bld 92  70 - 99 mg/dL   BUN 10  6 - 23 mg/dL   Creatinine, Ser 2.95  0.50 - 1.10 mg/dL   Calcium 8.4  8.4 - 62.1 mg/dL   Total Protein 6.2  6.0 - 8.3 g/dL   Albumin 3.3 (*) 3.5 - 5.2 g/dL   AST 19  0 - 37 U/L   ALT 27  0 - 35 U/L   Alkaline Phosphatase 60  39 - 117 U/L   Total Bilirubin 0.3  0.3 - 1.2 mg/dL   GFR calc non Af Amer >90  >90 mL/min   GFR calc Af Amer >90  >90 mL/min   UA and labs reviewed - normal crt.    Plan outpatient follow up.  Peripheral edema precautions verbalized as understood. Presentation does not suggest DVT, cellulitis or symptoms related to any lumbar pathology.    MDM   VS and nursing notes reviewed.         Sunnie Nielsen, MD 08/24/12 [phone]

## 2012-08-24 NOTE — ED Notes (Signed)
Pt reports numbness and tingling to BLE. Pt reports "feel like fluid in my legs"

## 2013-04-13 ENCOUNTER — Ambulatory Visit (INDEPENDENT_AMBULATORY_CARE_PROVIDER_SITE_OTHER): Payer: BC Managed Care – PPO | Admitting: Family Medicine

## 2013-04-13 VITALS — BP 118/70 | HR 86 | Temp 99.0°F | Resp 16 | Ht 62.0 in | Wt 148.0 lb

## 2013-04-13 DIAGNOSIS — R200 Anesthesia of skin: Secondary | ICD-10-CM

## 2013-04-13 DIAGNOSIS — R51 Headache: Secondary | ICD-10-CM

## 2013-04-13 DIAGNOSIS — R209 Unspecified disturbances of skin sensation: Secondary | ICD-10-CM

## 2013-04-13 LAB — POCT CBC
Granulocyte percent: 68 % (ref 37–80)
HCT, POC: 42 % (ref 37.7–47.9)
Hemoglobin: 13.2 g/dL (ref 12.2–16.2)
Lymph, poc: 2.3 (ref 0.6–3.4)
MCH, POC: 28.9 pg (ref 27–31.2)
MCHC: 31.4 g/dL — AB (ref 31.8–35.4)
MCV: 91.8 fL (ref 80–97)
MID (cbc): 0.4 (ref 0–0.9)
MPV: 8.8 fL (ref 0–99.8)
POC Granulocyte: 5.8 (ref 2–6.9)
POC LYMPH PERCENT: 27.1 % (ref 10–50)
POC MID %: 4.9 %M (ref 0–12)
Platelet Count, POC: 335 10*3/uL (ref 142–424)
RBC: 4.57 M/uL (ref 4.04–5.48)
RDW, POC: 13.7 %
WBC: 8.5 10*3/uL (ref 4.6–10.2)

## 2013-04-13 MED ORDER — PROMETHAZINE HCL 25 MG PO TABS: 25.0000 mg | ORAL_TABLET | Freq: Three times a day (TID) | ORAL | Status: DC | PRN

## 2013-04-13 MED ORDER — KETOROLAC TROMETHAMINE 60 MG/2ML IM SOLN
60.0000 mg | Freq: Once | INTRAMUSCULAR | Status: AC
  Administered 2013-04-13: 60 mg via INTRAMUSCULAR

## 2013-04-13 MED ORDER — BUTALBITAL-APAP-CAFFEINE 50-325-40 MG PO TABS: 1.0000 | ORAL_TABLET | Freq: Four times a day (QID) | ORAL | Status: AC | PRN

## 2013-04-13 NOTE — Patient Instructions (Addendum)
Headache Headaches are caused by many different problems. Most commonly, headache is caused by muscle tension from an injury, fatigue, or emotional upset. Excessive muscle contractions in the scalp and neck result in a headache that often feels like a tight band around the head. Tension headaches often have areas of tenderness over the scalp and the back of the neck. These headaches may last for hours, days, or longer, and some may contribute to migraines in those who have migraine problems. Migraines usually cause a throbbing headache, which is made worse by activity. Sometimes only one side of the head hurts. Nausea, vomiting, eye pain, and avoidance of food are common with migraines. Visual symptoms such as light sensitivity, blind spots, or flashing lights may also occur. Loud noises may worsen migraine headaches. Many factors may cause migraine headaches:  Emotional stress, lack of sleep, and menstrual periods.   Alcohol and some drugs (such as birth control pills).   Diet factors (fasting, caffeine, food preservatives, chocolate).   Environmental factors (weather changes, bright lights, odors, smoke).  Other causes of headaches include minor injuries to the head. Arthritis in the neck; problems with the jaw, eyes, ears, or nose are also causes of headaches. Allergies, drugs, alcohol, and exposure to smoke can also cause moderate headaches. Rebound headaches can occur if someone uses pain medications for a long period of time and then stops. Less commonly, blood vessel problems in the neck and brain (including stroke) can cause various types of headache. Treatment of headaches includes medicines for pain and relaxation. Ice packs or heat applied to the back of the head and neck help some people. Massaging the shoulders, neck and scalp are often very useful. Relaxation techniques and stretching can help prevent these headaches. Avoid alcohol and cigarette smoking as these tend to make headaches  worse. Please see your caregiver if your headache is not better in 2 days.  SEEK IMMEDIATE MEDICAL CARE IF:   You develop a high fever, chills, or repeated vomiting.   You faint or have difficulty with vision.   You develop unusual numbness or weakness of your arms or legs.   Relief of pain is inadequate with medication, or you develop severe pain.   You develop confusion, or neck stiffness.   You have a worsening of a headache or do not obtain relief.  Document Released: 11/19/2005 Document Revised: 11/08/2011 Document Reviewed: 05/15/2007 [employer] Patient Information 2012 [employer]. 

## 2013-04-13 NOTE — Progress Notes (Signed)
[facility]:  Office Visit  Chief Complaint:  Chief Complaint  Patient presents with  . Headache    x 1 day    HPI: [name] [name] is a 27 y.o. female who complains of  1 day history of her typical Frontal HA that radiates to her neck, she took 2 tramadol last night without relief , has not been nauseated. Was dx with tension HA in the past. Toradol has helped her. She used to go to [employer] and that is where she was dx with tension HAs. Works a lot, does not eat or sleep well on a regular basis. 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  + low back pain and numbness. No urinary sxs. Denies SOB, CP. Has not tried any NSAIDs for this, just the tramadol which she has for LBP.   Past Medical History  Diagnosis Date  . Heart murmur    Past Surgical History  Procedure Laterality Date  . Bunionectomy  2005   History   Social History  . Marital Status: Single    Spouse Name: N/A    Number of Children: N/A  . Years of Education: N/A   Social History Main Topics  . Smoking status: Never Smoker   . Smokeless tobacco: None  . Alcohol Use: No  . Drug Use: No  . Sexually Active: None   Other Topics Concern  . None   Social History Narrative  . None   Family History  Problem Relation Age of Onset  . Hypertension Mother   . Thyroid disease Mother   . Hypertension Maternal Grandmother    No Known Allergies Prior to Admission medications   Medication Sig Start Date End Date Taking? Authorizing Provider  ampicillin (PRINCIPEN) 500 MG capsule Take 500 mg by mouth 4 (four) times daily.   Yes Historical Provider, MD  cyclobenzaprine (FLEXERIL) 10 MG tablet Take 10 mg by mouth 3 (three) times daily as needed for muscle spasms.   Yes Historical Provider, MD  traMADol (ULTRAM) 50 MG tablet Take 50 mg by mouth every 6 (six) hours as needed for pain.   Yes Historical Provider, MD     ROS:  The patient denies fevers, chills, night sweats, unintentional weight loss, chest pain, palpitations, wheezing, dyspnea on exertion, nausea, vomiting, abdominal pain, dysuria, hematuria, melena, weakness, + numbness/ tingling going from thigh down to toes sometimes, feels like both legs are asleep. .   All other systems have been reviewed and were otherwise negative with the exception of those mentioned in the HPI and as above.    PHYSICAL EXAM: Filed Vitals:   04/13/13 2058  BP: 118/70  Pulse: 86  Temp: 99 F (37.2 C)  Resp: 16   Filed Vitals:   04/13/13 2058  Height: 5\' 2"  (1.575 m)  Weight: 148 lb (67.132 kg)  SpO2    98% Body mass index is 27.06 kg/(m^2).  General: Alert, no acute distress HEENT:  Normocephalic, atraumatic, oropharynx patent. EOMI, PERRLA, fundoscopic exam nl. She did not have light sensitivity on exam Cardiovascular:  Regular rate and rhythm, no rubs murmurs or gallops.  No Carotid bruits, radial pulse intact. No pedal edema.  Respiratory: Clear to auscultation bilaterally.  No wheezes, rales, or rhonchi.  No cyanosis, no use of accessory musculature GI: No organomegaly, abdomen is soft and non-tender, positive bowel sounds.  No masses. Skin: No rashes. Neurologic:  Facial musculature symmetric. CN 2-12 grossly normal.  Psychiatric: Patient is appropriate throughout our interaction. Lymphatic: No cervical lymphadenopathy Musculoskeletal: Gait intact. 5/5 strength , sensation intact, no saddle anesthesia, 2/2 DTR bilateral LEs No meningeal signs Cerebellar fxn normal-Romberg normal     LABS: Results for orders placed in visit on 04/13/13  POCT CBC      Result Value Range   WBC 8.5  4.6 - 10.2 K/uL   Lymph, poc 2.3  0.6 - 3.4   POC LYMPH PERCENT 27.1  10 - 50 %L   MID (cbc) 0.4  0 - 0.9   POC MID % 4.9  0 - 12 %M   POC Granulocyte 5.8  2 - 6.9   Granulocyte percent 68.0  37 - 80 %G   RBC 4.57  4.04 - 5.48 M/uL   Hemoglobin 13.2  12.2 - 16.2 g/dL    HCT, POC 52.8  41.3 - 47.9 %   MCV 91.8  80 - 97 fL   MCH, POC 28.9  27 - 31.2 pg   MCHC 31.4 (*) 31.8 - 35.4 g/dL   RDW, POC 24.4     Platelet Count, POC 335  142 - 424 K/uL   MPV 8.8  0 - 99.8 fL     EKG/XRAY:   Primary read interpreted by Dr. Conley Rolls at [facility].   ASSESSMENT/PLAN: Encounter Diagnoses  Name Primary?  . Headache Yes  . Numbness in both legs    Toradol 60 mg  IM x 1 Rx Phenergan Rx Fioricet D/w patient risks and benefits of medicines, avoid taking tramadol and flexeril with Headache meds due to increasing SEs, drowsiness, respiratory issues F/u if no improvement in 48 hrs or have neuoro sxs or worsening sxs  prn CMP pending     LE, THAO PHUONG, DO 04/13/2013 9:24 PM

## 2013-04-14 LAB — COMPREHENSIVE METABOLIC PANEL WITH GFR
ALT: 16 U/L (ref 0–35)
AST: 14 U/L (ref 0–37)
Albumin: 4.4 g/dL (ref 3.5–5.2)
CO2: 27 meq/L (ref 19–32)
Calcium: 9.6 mg/dL (ref 8.4–10.5)
Glucose, Bld: 94 mg/dL (ref 70–99)
Sodium: 137 meq/L (ref 135–145)
Total Bilirubin: 0.5 mg/dL (ref 0.3–1.2)

## 2013-04-14 LAB — COMPREHENSIVE METABOLIC PANEL
Alkaline Phosphatase: 58 U/L (ref 39–117)
BUN: 10 mg/dL (ref 6–23)
Chloride: 101 mEq/L (ref 96–112)
Creat: 0.84 mg/dL (ref 0.50–1.10)
Potassium: 4.4 mEq/L (ref 3.5–5.3)
Total Protein: 7 g/dL (ref 6.0–8.3)

## 2013-04-30 ENCOUNTER — Ambulatory Visit (INDEPENDENT_AMBULATORY_CARE_PROVIDER_SITE_OTHER): Payer: BC Managed Care – PPO | Admitting: Family Medicine

## 2013-04-30 VITALS — BP 116/78 | HR 64 | Temp 98.2°F | Resp 16 | Ht 62.0 in | Wt 145.0 lb

## 2013-04-30 DIAGNOSIS — G44209 Tension-type headache, unspecified, not intractable: Secondary | ICD-10-CM

## 2013-04-30 MED ORDER — DICLOFENAC SODIUM 75 MG PO TBEC: 75.0000 mg | DELAYED_RELEASE_TABLET | Freq: Two times a day (BID) | ORAL | Status: DC | PRN

## 2013-04-30 MED ORDER — KETOROLAC TROMETHAMINE 60 MG/2ML IM SOLN
60.0000 mg | Freq: Once | INTRAMUSCULAR | Status: AC
  Administered 2013-04-30: 60 mg via INTRAMUSCULAR

## 2013-04-30 NOTE — Patient Instructions (Addendum)
Stop taking the Fioricet.    Use the Diclofenac once or twice a day if you need it as a pain reliever.  Let us know if you're having worsening pain, trouble seeing, fevers, or other concerns.

## 2013-04-30 NOTE — Progress Notes (Signed)
[name] [name] is a 27 y.o. female who [facility] today with complaints of headaches:  1.  Headaches:  Evaluated here about for the same about 2 weeks ago.  Prescribed Fioricet at that time.  Had previously been on Tramadol without relief.  Provided Toradol and had relief of headaches until Sunday, when these recurred.  Pain lasted all day long, right sided.  No vision changes, nausea, photophobia.  Describes as aching Right parietal, occipital region, and right side of neck.  TOok Fioricet on Sunday but had no relief.    Pain recurred again today about 6 hours ago.  Did not take a Fioricet today.  Same symptoms today as Sunday.  Would like to try something else for relief, asking for another Toradol shot.  Has noted increasing stress at work.  Has not had a day off in over a month.    No fevers, chills, stiffness of neck.  No weakness or paresthesias, numbness.    PMH reviewed.  Past Medical History  Diagnosis Date  . Heart murmur    Past Surgical History  Procedure Laterality Date  . Bunionectomy  2005    Medications reviewed. Current Outpatient Prescriptions  Medication Sig Dispense Refill  . ampicillin (PRINCIPEN) 500 MG capsule Take 500 mg by mouth 4 (four) times daily.      . butalbital-acetaminophen-caffeine (FIORICET) 50-325-40 MG per tablet Take 1-2 tablets by mouth every 6 (six) hours as needed for headache.  20 tablet  0  . cyclobenzaprine (FLEXERIL) 10 MG tablet Take 10 mg by mouth 3 (three) times daily as needed for muscle spasms.      . promethazine (PHENERGAN) 25 MG tablet Take 1 tablet (25 mg total) by mouth every 8 (eight) hours as needed for nausea.  20 tablet  0  . traMADol (ULTRAM) 50 MG tablet Take 50 mg by mouth every 6 (six) hours as needed for pain.       No current facility-administered medications for this visit.    ROS as above otherwise neg.    Physical Exam:  BP 116/78  Pulse 64  Temp(Src) 98.2 F (36.8 C) (Oral)  Resp 16  Ht 5\' 2"  (1.575  m)  Wt 145 lb (65.772 kg)  BMI 26.51 kg/m2  SpO2 100%  LMP 04/21/2013 Gen:  Alert, cooperative patient who appears stated age in no acute distress.  Vital signs reviewed. HEENT: PERRL. EOMI,  MMM.  Fundoscopy WNL with clear cup to disc margins BL Neck:  No stiffness noted.  Supple Pulm:  Clear to auscultation bilaterally with good air movement.  No wheezes or rales noted.   Cardiac:  Regular rate and rhythm without murmur auscultated.  Good S1/S2. Neuro:  CN II - XII intact.  No focal deficits noted.  Exts: Non edematous BL  LE, warm and well perfused.   Assessment and Plan:  1.  Headaches, tension type: - LIkely secondary to increased stress at work. - Works 2 jobs 7 days a week without breaks. - Will write for her to be out of work for next 3 days. - Since she has received relief with NSAIDs (toradol) will prescribe Diclofenac for relief.   - FU if acute worsening.

## 2013-11-10 ENCOUNTER — Ambulatory Visit (INDEPENDENT_AMBULATORY_CARE_PROVIDER_SITE_OTHER): Payer: 59 | Admitting: Family Medicine

## 2013-11-10 VITALS — BP 122/78 | HR 64 | Temp 98.4°F | Resp 17 | Ht 61.0 in | Wt 155.0 lb

## 2013-11-10 DIAGNOSIS — N6081 Other benign mammary dysplasias of right breast: Secondary | ICD-10-CM

## 2013-11-10 DIAGNOSIS — N6089 Other benign mammary dysplasias of unspecified breast: Secondary | ICD-10-CM

## 2013-11-10 DIAGNOSIS — L0291 Cutaneous abscess, unspecified: Secondary | ICD-10-CM

## 2013-11-10 MED ORDER — DOXYCYCLINE HYCLATE 100 MG PO CAPS: 100.0000 mg | ORAL_CAPSULE | Freq: Two times a day (BID) | ORAL | Status: DC

## 2013-11-10 MED ORDER — HYDROCODONE-ACETAMINOPHEN 5-325 MG PO TABS: 1.0000 | ORAL_TABLET | Freq: Four times a day (QID) | ORAL | Status: DC | PRN

## 2013-11-10 NOTE — Progress Notes (Signed)
Procedure Note: Verbal consent obtained.  Local anesthesia with 1 cc 2% lidocaine.  Betadine prep.  Incision with 11 blade.  Moderate purulence and sebaceous material expressed.  Cyst wall removed in pieces.  Wound irrigated with remaining anesthetic.  Packed with 1/4 inch plain packing.  Cleansed and dressed.  Discussed wound care.  Pt tolerated very well.  RTC 48 hours for wound care.

## 2013-11-10 NOTE — Patient Instructions (Signed)
Take the antibiotic as directed.  Use the tramadol if needed for pain (you can also use Tylenol or Advil.)  Change the dressing at least once per day - more frequently if the dressing gets wet or saturated.  Leave the packing in place.  Frequent hot compresses over the next 48 hours to help facilitate further drainage.  Return on 12/11 for recheck   Epidermal Cyst An epidermal cyst is sometimes called a sebaceous cyst, epidermal inclusion cyst, or infundibular cyst. These cysts usually contain a substance that looks "pasty" or "cheesy" and may have a bad smell. This substance is a protein called keratin. Epidermal cysts are usually found on the face, neck, or trunk. They may also occur in the vaginal area or other parts of the genitalia of both men and women. Epidermal cysts are usually small, painless, slow-growing bumps or lumps that move freely under the skin. It is important not to try to pop them. This may cause an infection and lead to tenderness and swelling. CAUSES  Epidermal cysts may be caused by a deep penetrating injury to the skin or a plugged hair follicle, often associated with acne. SYMPTOMS  Epidermal cysts can become inflamed and cause:  Redness.  Tenderness.  Increased temperature of the skin over the bumps or lumps.  Grayish-white, bad smelling material that drains from the bump or lump. DIAGNOSIS  Epidermal cysts are easily diagnosed by your caregiver during an exam. Rarely, a tissue sample (biopsy) may be taken to rule out other conditions that may resemble epidermal cysts. TREATMENT   Epidermal cysts often get better and disappear on their own. They are rarely ever cancerous.  If a cyst becomes infected, it may become inflamed and tender. This may require opening and draining the cyst. Treatment with antibiotics may be necessary. When the infection is gone, the cyst may be removed with minor surgery.  Small, inflamed cysts can often be treated with antibiotics or by  injecting steroid medicines.  Sometimes, epidermal cysts become large and bothersome. If this happens, surgical removal in your caregiver's office may be necessary. HOME CARE INSTRUCTIONS  Only take over-the-counter or prescription medicines as directed by your caregiver.  Take your antibiotics as directed. Finish them even if you start to feel better. SEEK MEDICAL CARE IF:   Your cyst becomes tender, red, or swollen.  Your condition is not improving or is getting worse.  You have any other questions or concerns. MAKE SURE YOU:  Understand these instructions.  Will watch your condition.  Will get help right away if you are not doing well or get worse. Document Released: 10/20/2004 Document Revised: 02/11/2012 Document Reviewed: 05/28/2011 [facility] Patient Information 2014 [place], [place].

## 2013-11-10 NOTE — Progress Notes (Signed)
Subjective:    Patient ID: [name] [name], female    DOB: Feb 27, 1986, 27 y.o.   MRN: [id number] This chart was scribed for Sherren Mocha, MD by Valera Castle, ED Scribe. This patient was seen in room 4 and the patient's care was started at 2:00 PM.  Chief Complaint  Patient presents with  . boil on chest    per patient     HPI [name] [name] is a 27 y.o. female who presents to the [facility] complaining of bump on her chest beneath her right breast. She states the bump has been there for a long time now, but reports that the bump has turned into a boil recently. She denies drainage from the area but it has become increasing painful over the past few days - yesterday she could even sleep and had trouble working due to the pain. She denies having I&D before. She denies fever, and any other symptoms.   She states her tension headaches have been better due to taking Fioricet. She states the last time she had to take the medication was over one month ago.   She has 37 year old son in kindergarten. She reports she is working 2 jobs and going to school so busy 7d/wk.  PCP - Burnett Kanaris, PA-C  There are no active problems to display for this patient.  Past Medical History  Diagnosis Date  . Heart murmur    Past Surgical History  Procedure Laterality Date  . Bunionectomy  2005   No Known Allergies Prior to Admission medications   Medication Sig Start Date End Date Taking? Authorizing Provider  butalbital-acetaminophen-caffeine (FIORICET) 50-325-40 MG per tablet Take 1-2 tablets by mouth every 6 (six) hours as needed for headache. 04/13/13 04/13/14 Yes Thao P Le, DO  ampicillin (PRINCIPEN) 500 MG capsule Take 500 mg by mouth 4 (four) times daily.    Historical Provider, MD  cyclobenzaprine (FLEXERIL) 10 MG tablet Take 10 mg by mouth 3 (three) times daily as needed for muscle spasms.    Historical Provider, MD  diclofenac (VOLTAREN) 75 MG EC tablet Take 1 tablet (75 mg total) by mouth 2 (two) times  daily as needed. 04/30/13   Tobey Grim, MD  promethazine (PHENERGAN) 25 MG tablet Take 1 tablet (25 mg total) by mouth every 8 (eight) hours as needed for nausea. 04/13/13   Thao P Le, DO  traMADol (ULTRAM) 50 MG tablet Take 50 mg by mouth every 6 (six) hours as needed for pain.    Historical Provider, MD   Family History  Problem Relation Age of Onset  . Hypertension Mother   . Thyroid disease Mother   . Hypertension Maternal Grandmother    History   Social History  . Marital Status: Single    Spouse Name: N/A    Number of Children: N/A  . Years of Education: N/A   Occupational History  . Not on file.   Social History Main Topics  . Smoking status: Never Smoker   . Smokeless tobacco: Not on file  . Alcohol Use: No  . Drug Use: No  . Sexual Activity: No   Other Topics Concern  . Not on file   Social History Narrative  . No narrative on file    Review of Systems  Constitutional: Negative for fever, chills, diaphoresis and activity change.  Respiratory: Negative for chest tightness.   Musculoskeletal: Positive for myalgias. Negative for gait problem and joint swelling.  Skin: Negative for color change,  pallor, rash and wound.       Boil on chest.  Neurological: Negative for light-headedness and headaches.  Hematological: Negative for adenopathy. Does not bruise/bleed easily.  Psychiatric/Behavioral: Positive for sleep disturbance.    BP 122/78  Pulse 64  Temp(Src) 98.4 F (36.9 C) (Oral)  Resp 17  Ht 5\' 1"  (1.549 m)  Wt 155 lb (70.308 kg)  BMI 29.30 kg/m2  SpO2 99%  LMP 10/30/2013     Objective:   Physical Exam  Nursing note and vitals reviewed. Constitutional: She is oriented to person, place, and time. She appears well-developed and well-nourished. No distress.  HENT:  Head: Normocephalic and atraumatic.  Eyes: EOM are normal.  Neck: Neck supple. No tracheal deviation present.  Cardiovascular: Normal rate.   Pulmonary/Chest: Effort normal. No  respiratory distress.  Musculoskeletal: Normal range of motion.  Neurological: She is alert and oriented to person, place, and time.  Skin: Skin is warm and dry.  1x2 cm likely sebaceous cyst inferior to medial right breast with central pore. Very tender to palpation, no warmth or drainages. No overlying skin changes inc erythema.  Psychiatric: She has a normal mood and affect. Her behavior is normal.       Assessment & Plan:  Cellulitis and abscess - Plan: Wound culture  Sebaceous cyst of breast, right - s/p I&D today by Debbra Riding. Recheck in 48 hrs.  Meds ordered this encounter  Medications  . doxycycline (VIBRAMYCIN) 100 MG capsule    Sig: Take 1 capsule (100 mg total) by mouth 2 (two) times daily.    Dispense:  20 capsule    Refill:  0  . HYDROcodone-acetaminophen (NORCO/VICODIN) 5-325 MG per tablet    Sig: Take 1 tablet by mouth every 6 (six) hours as needed for moderate pain.    Dispense:  15 tablet    Refill:  0    I personally performed the services described in this documentation, which was scribed in my presence. The recorded information has been reviewed and considered, and addended by me as needed.  Norberto Sorenson, MD MPH

## 2013-11-12 ENCOUNTER — Ambulatory Visit (INDEPENDENT_AMBULATORY_CARE_PROVIDER_SITE_OTHER): Payer: 59 | Admitting: Physician Assistant

## 2013-11-12 VITALS — BP 116/70 | HR 88 | Temp 98.3°F | Resp 16 | Ht 61.5 in | Wt 157.8 lb

## 2013-11-12 DIAGNOSIS — L089 Local infection of the skin and subcutaneous tissue, unspecified: Secondary | ICD-10-CM

## 2013-11-12 DIAGNOSIS — L723 Sebaceous cyst: Secondary | ICD-10-CM

## 2013-11-12 NOTE — Progress Notes (Signed)
   Patient ID: RAINA SOLE MRN: [id number], DOB: May 26, 1986 27 y.o. Date of Encounter: 11/12/2013, 12:41 PM  Primary Physician: Rodney Booze  Chief Complaint: Wound care   See previous note  HPI: 27 y.o. female presents for wound care s/p I&D on 11/10/13 Doing well No issues or complaints Afebrile/ no chills No nausea or vomiting Tolerating doxycycline without issues Pain improving Original dressing still in place Previous note reviewed  Past Medical History  Diagnosis Date  . Heart murmur      Home Meds: Prior to Admission medications   Medication Sig Start Date End Date Taking? Authorizing Provider  butalbital-acetaminophen-caffeine (FIORICET) 50-325-40 MG per tablet Take 1-2 tablets by mouth every 6 (six) hours as needed for headache. 04/13/13 04/13/14 Yes Thao P Le, DO  doxycycline (VIBRAMYCIN) 100 MG capsule Take 1 capsule (100 mg total) by mouth 2 (two) times daily. 11/10/13  Yes Sherren Mocha, MD  HYDROcodone-acetaminophen (NORCO/VICODIN) 5-325 MG per tablet Take 1 tablet by mouth every 6 (six) hours as needed for moderate pain. 11/10/13  Yes Sherren Mocha, MD    Allergies: No Known Allergies  ROS: Constitutional: Afebrile, no chills Cardiovascular: negative for chest pain or palpitations Dermatological: Positive for wound and improving pain GI: No nausea or vomiting   EXAM: Physical Exam: Blood pressure 116/70, pulse 88, temperature 98.3 F (36.8 C), temperature source Oral, resp. rate 16, height 5' 1.5" (1.562 m), weight 157 lb 12.8 oz (71.578 kg), last menstrual period 10/30/2013, SpO2 99.00%., Body mass index is 29.34 kg/(m^2). General: Well developed, well nourished, in no acute distress. Nontoxic appearing. Head: Normocephalic, atraumatic, sclera non-icteric.  Neck: Supple. Lungs: Breathing is unlabored. Heart: Normal rate. Skin:  Warm and moist. Dressing and packing in place. Minimal induration and erythema and tenderness to palpation. Neuro: Alert  and oriented X 3. Moves all extremities spontaneously. Normal gait.  Psych:  Responds to questions appropriately with a normal affect.   PROCEDURE: Dressing and packing removed Scant drainage on the dressing Small amount of purulence expressed Wound bed healthy Irrigated with 1% plain lidocaine 5 cc Repacked with 1/4 inch plain packing Dressing applied  Angie McFarland in room for entire procedure.   LAB: Culture: no growth 2 days  A/P: 27 y.o. female with cellulitis/abscess as above s/p I&D on 11/10/13 -Wound care per above -Continue doxycycline -Pain well controlled -Daily dressing changes -Recheck 48 hours  Signed, Eula Listen, PA-C [facility] [place], [place] [address] [phone] 11/12/2013 12:41 PM

## 2013-11-13 LAB — WOUND CULTURE
Gram Stain: NONE SEEN
Gram Stain: NONE SEEN
Gram Stain: NONE SEEN

## 2013-11-14 ENCOUNTER — Ambulatory Visit (INDEPENDENT_AMBULATORY_CARE_PROVIDER_SITE_OTHER): Payer: 59 | Admitting: Physician Assistant

## 2013-11-14 VITALS — BP 120/82 | HR 77 | Temp 99.2°F | Resp 16

## 2013-11-14 DIAGNOSIS — L723 Sebaceous cyst: Secondary | ICD-10-CM

## 2013-11-14 DIAGNOSIS — L089 Local infection of the skin and subcutaneous tissue, unspecified: Secondary | ICD-10-CM

## 2013-11-14 NOTE — Progress Notes (Signed)
Patient ID: [name] [name] MRN: [id number], DOB: 08/21/1986 27 y.o. Date of Encounter: 11/14/2013, 1:31 PM  Chief Complaint: Wound care   See previous note  HPI: 27 y.o. y/o female presents for wound care s/p I&D on 11/10/13. Doing well No issues or complaints Afebrile/ no chills No nausea or vomiting Tolerating doxycycline.  Pain resolved.  Daily dressing change Previous note reviewed  Past Medical History  Diagnosis Date  . Heart murmur      Home Meds: Prior to Admission medications   Medication Sig Start Date End Date Taking? Authorizing Provider  butalbital-acetaminophen-caffeine (FIORICET) 50-325-40 MG per tablet Take 1-2 tablets by mouth every 6 (six) hours as needed for headache. 04/13/13 04/13/14 Yes Thao P Le, DO  doxycycline (VIBRAMYCIN) 100 MG capsule Take 1 capsule (100 mg total) by mouth 2 (two) times daily. 11/10/13  Yes Sherren Mocha, MD  HYDROcodone-acetaminophen (NORCO/VICODIN) 5-325 MG per tablet Take 1 tablet by mouth every 6 (six) hours as needed for moderate pain. 11/10/13  Yes Sherren Mocha, MD    Allergies: No Known Allergies  ROS: Constitutional: Afebrile, no chills Cardiovascular: negative for chest pain or palpitations Dermatological: Positive for wound. Negative for erythema, pain, or warmth.  GI: No nausea or vomiting   EXAM: Physical Exam: Blood pressure 120/82, pulse 77, temperature 99.2 F (37.3 C), temperature source Oral, resp. rate 16, last menstrual period 10/30/2013, SpO2 99.00%., There is no weight on file to calculate BMI. General: Well developed, well nourished, in no acute distress. Nontoxic appearing. Head: Normocephalic, atraumatic, sclera non-icteric.  Neck: Supple. Lungs: Breathing is unlabored. Heart: Normal rate. Skin:  Warm and moist. Dressing and packing in place. No induration, erythema, or tenderness to palpation. Neuro: Alert and oriented X 3. Moves all extremities spontaneously. Normal gait.  Psych:  Responds to questions  appropriately with a normal affect.       PROCEDURE: Dressing and packing removed. Minimal amount of purulence expressed. Small amount of sebaceous material expressed.  Wound bed healthy Irrigated with 1% plain lidocaine 5 cc. Repacked with 1/4 plain packing.  Dressing applied  LAB: Culture: no growth  A/P: 27 y.o. y/o female with infected sebaceous cyst of chest wall as above s/p I&D on 11/10/13.  Wound care per above Continue doxycycline.  Pain well controlled Daily dressing changes Recheck 48 hours  Signed, Rhoderick Moody, PA-C 11/14/2013 1:31 PM

## 2013-11-16 ENCOUNTER — Ambulatory Visit (INDEPENDENT_AMBULATORY_CARE_PROVIDER_SITE_OTHER): Payer: 59 | Admitting: Physician Assistant

## 2013-11-16 VITALS — BP 110/66 | HR 93 | Temp 97.7°F | Resp 18 | Wt 155.0 lb

## 2013-11-16 DIAGNOSIS — R519 Headache, unspecified: Secondary | ICD-10-CM | POA: Insufficient documentation

## 2013-11-16 DIAGNOSIS — L089 Local infection of the skin and subcutaneous tissue, unspecified: Secondary | ICD-10-CM

## 2013-11-16 DIAGNOSIS — R51 Headache: Secondary | ICD-10-CM | POA: Insufficient documentation

## 2013-11-16 DIAGNOSIS — L723 Sebaceous cyst: Secondary | ICD-10-CM

## 2013-11-16 NOTE — Progress Notes (Signed)
   Subjective:    Patient ID: [name] [name], female    DOB: 1986/02/19, 27 y.o.   MRN: [id number]   PCP: Rodney Booze  Chief Complaint  Patient presents with  . Wound Check    HPI  Presents for wound care s/p I&D of infected sebaceous cyst on 11/10/2013.  Tolerating the antibiotic well.  Pain is much improved.  No fever/chills.  Review of Systems     Objective:   Physical Exam  BP 110/66  Pulse 93  Temp(Src) 97.7 F (36.5 C) (Oral)  Resp 18  Wt 155 lb (70.308 kg)  SpO2 99%  LMP 10/30/2013 WDWNBF, A&O x 3. Dressing removed.  Packing no longer in place, stuck to the dressing. Minimal induration. No erythema.  Non-tender. Small amount of cyst sac removed with forceps.  Unable to repack. Small bandage applied.      Assessment & Plan:  1. Infected sebaceous cyst Complete antibiotic.  Continue local wound care.  RTC PRN.   Fernande Bras, PA-C Physician Assistant-Certified [facility]

## 2013-11-16 NOTE — Patient Instructions (Signed)
Continue the antibiotic as prescribed. Apply a warm compress to the area for 15-20 minutes 2-4 times each day, until it's healed.

## 2013-12-26 ENCOUNTER — Ambulatory Visit (INDEPENDENT_AMBULATORY_CARE_PROVIDER_SITE_OTHER): Payer: 59 | Admitting: Family Medicine

## 2013-12-26 ENCOUNTER — Ambulatory Visit: Payer: 59

## 2013-12-26 VITALS — BP 105/64 | HR 63 | Temp 98.1°F | Resp 12 | Ht 61.5 in | Wt 159.0 lb

## 2013-12-26 DIAGNOSIS — M545 Low back pain, unspecified: Secondary | ICD-10-CM

## 2013-12-26 DIAGNOSIS — IMO0002 Reserved for concepts with insufficient information to code with codable children: Secondary | ICD-10-CM

## 2013-12-26 IMAGING — CR DG LUMBAR SPINE COMPLETE 4+V
5 series · 5 of 5 positions shown · non-contrast
Comparison: Abdominal radiograph.  [DATE]

CLINICAL DATA: Low back pain for 1 week, no trauma.

EXAM:
LUMBAR SPINE - COMPLETE 4+ VIEW

[AP]
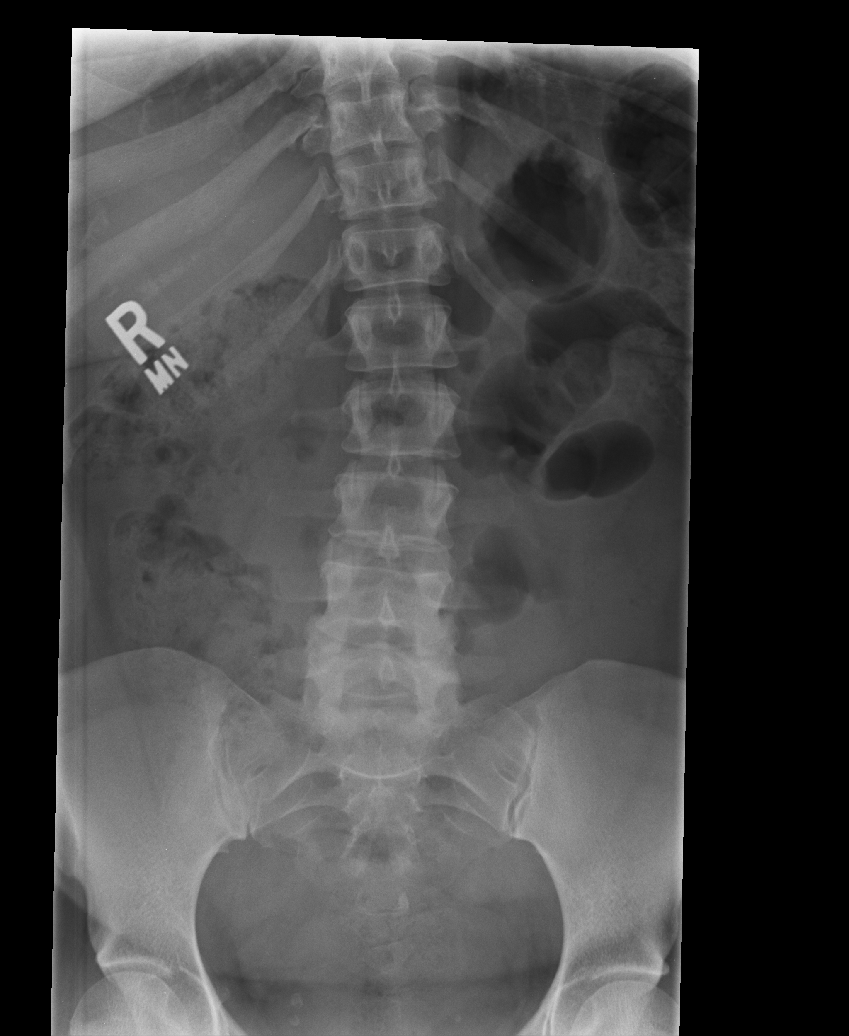

[rpo]
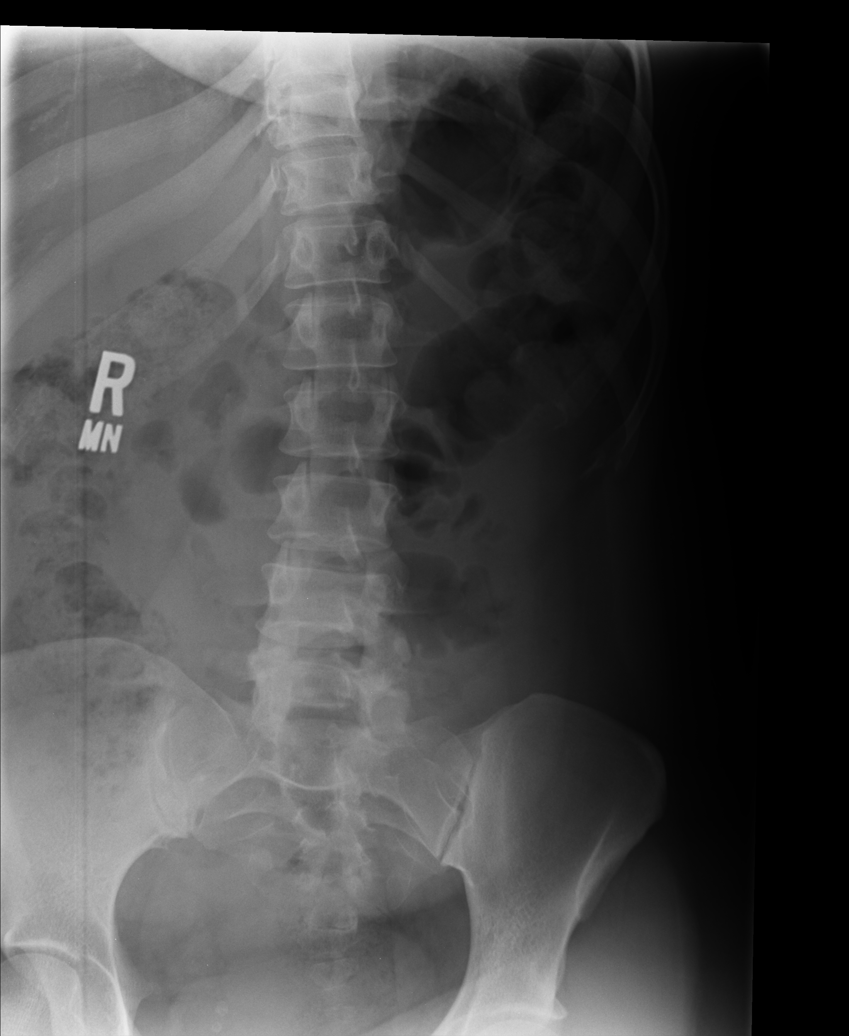

[lpo]
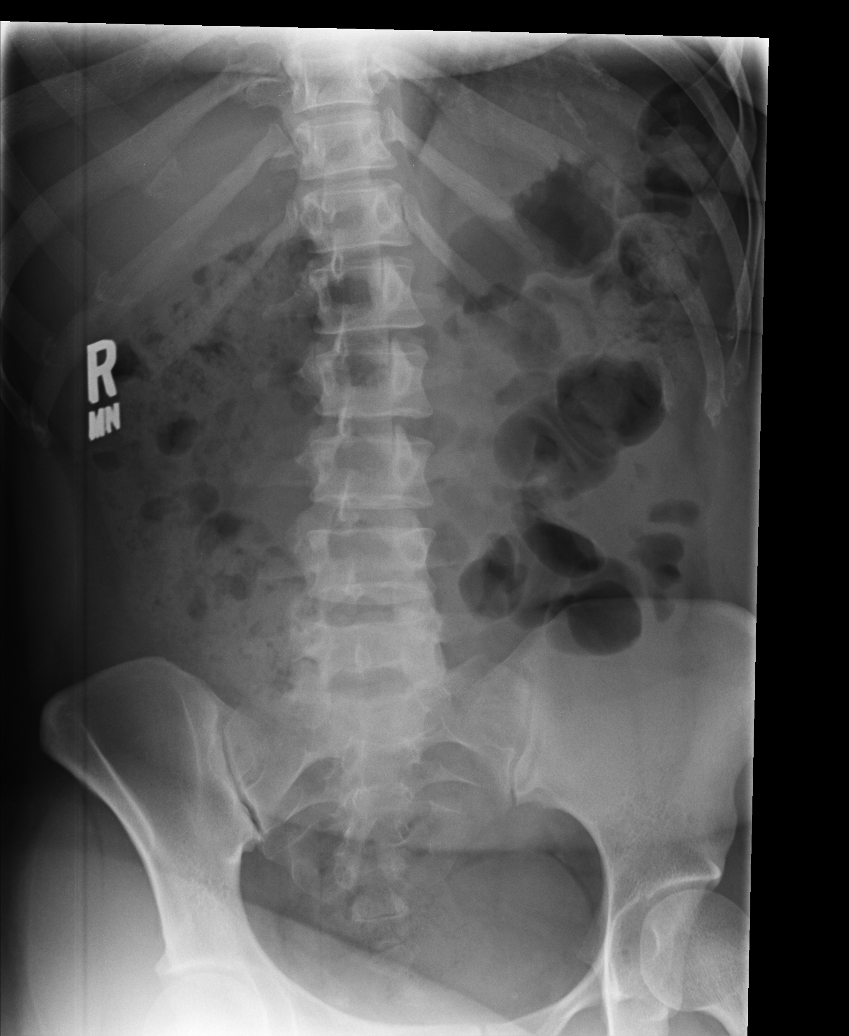

[lateral]
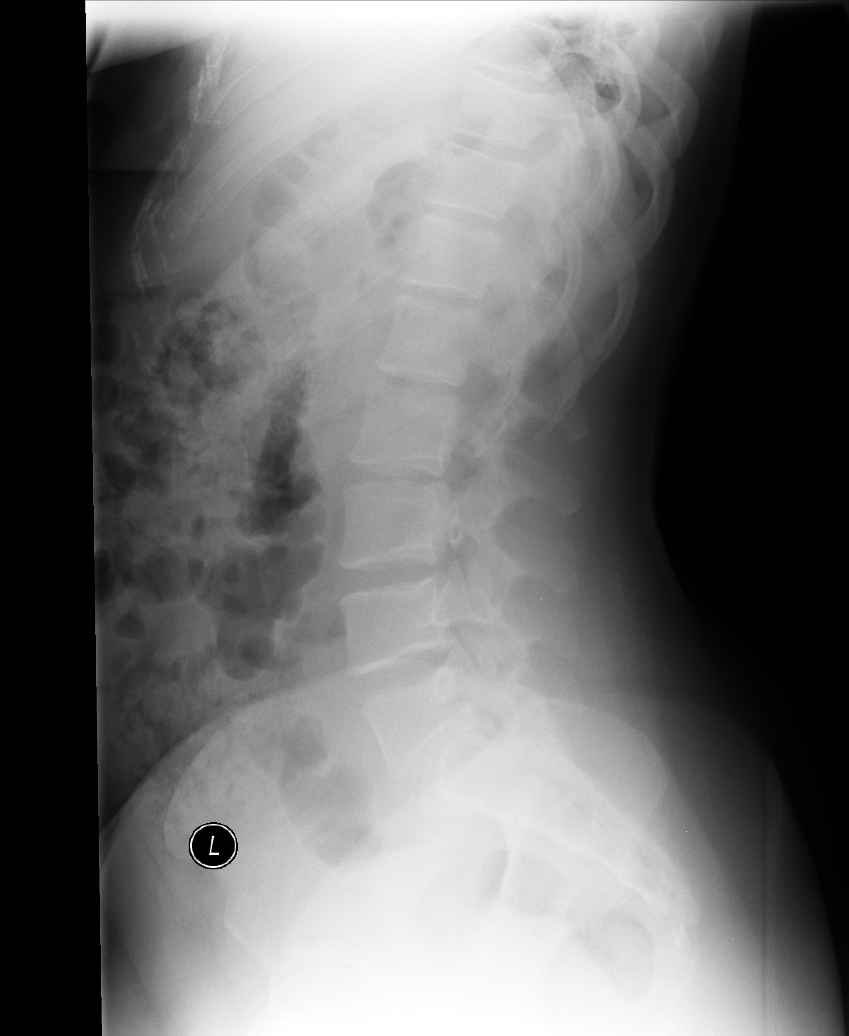

[l5 s1]
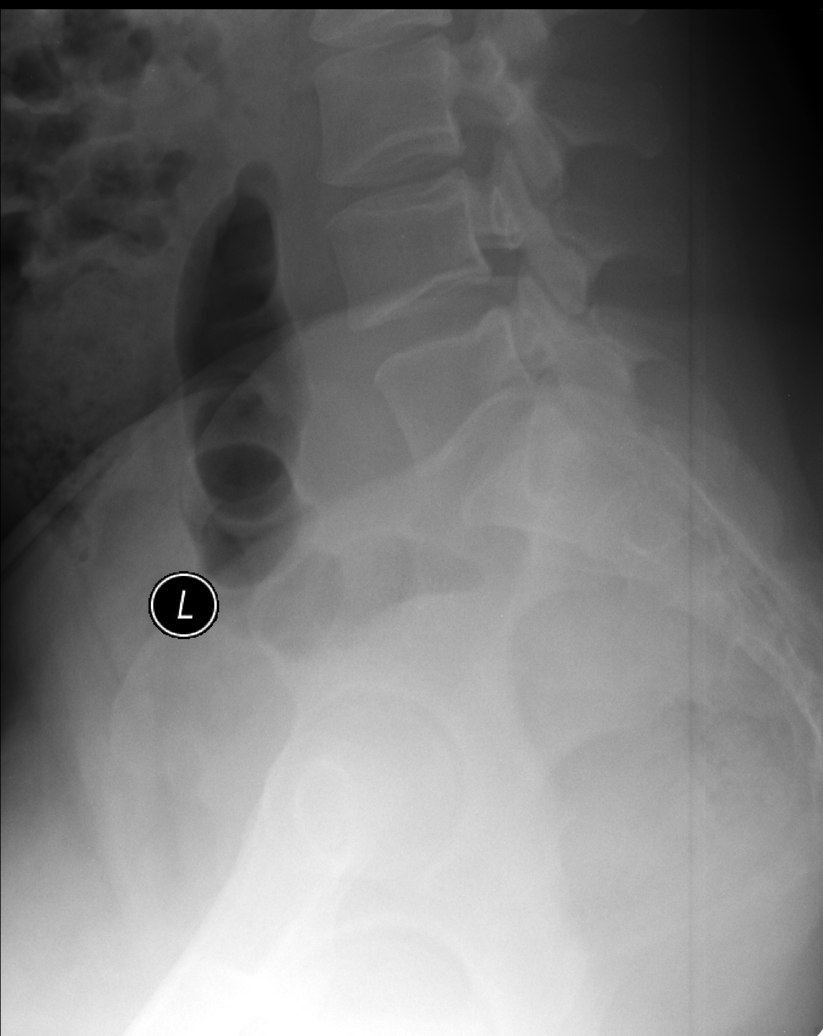

[5 of 5 positions shown; findings below may reference images not displayed]

FINDINGS: Five non rib-bearing lumbar-type vertebral bodies are intact and
aligned with maintenance of the lumbar lordosis. Mild broad
thoracolumbar levoscoliosis was present on prior examination.
Intervertebral disc heights are normal. No destructive bony lesions.
No pars interarticularis defects.

Sacroiliac joints are symmetric. Included prevertebral and
paraspinal soft tissue planes are non-suspicious. Phleboliths
project in the pelvis.
IMPRESSION: No acute lumbar spine fracture deformity or malalignment.

Mild broad thoracolumbar levoscoliosis.

  By: MARIANI

## 2013-12-26 MED ORDER — CYCLOBENZAPRINE HCL 5 MG PO TABS: 5.0000 mg | ORAL_TABLET | Freq: Every evening | ORAL | Status: DC | PRN

## 2013-12-26 MED ORDER — NAPROXEN 500 MG PO TABS: 500.0000 mg | ORAL_TABLET | Freq: Two times a day (BID) | ORAL | Status: DC

## 2013-12-26 NOTE — Patient Instructions (Signed)
Back Exercises °Back exercises help treat and prevent back injuries. The goal of back exercises is to increase the strength of your abdominal and back muscles and the flexibility of your back. These exercises should be started when you no longer have back pain. Back exercises include: °· Pelvic Tilt. Lie on your back with your knees bent. Tilt your pelvis until the lower part of your back is against the floor. Hold this position 5 to 10 sec and repeat 5 to 10 times. °· Knee to Chest. Pull first 1 knee up against your chest and hold for 20 to 30 seconds, repeat this with the other knee, and then both knees. This may be done with the other leg straight or bent, whichever feels better. °· Sit-Ups or Curl-Ups. Bend your knees 90 degrees. Start with tilting your pelvis, and do a partial, slow sit-up, lifting your trunk only 30 to 45 degrees off the floor. Take at least 2 to 3 seconds for each sit-up. Do not do sit-ups with your knees out straight. If partial sit-ups are difficult, simply do the above but with only tightening your abdominal muscles and holding it as directed. °· Hip-Lift. Lie on your back with your knees flexed 90 degrees. Push down with your feet and shoulders as you raise your hips a couple inches off the floor; hold for 10 seconds, repeat 5 to 10 times. °· Back arches. Lie on your stomach, propping yourself up on bent elbows. Slowly press on your hands, causing an arch in your low back. Repeat 3 to 5 times. Any initial stiffness and discomfort should lessen with repetition over time. °· Shoulder-Lifts. Lie face down with arms beside your body. Keep hips and torso pressed to floor as you slowly lift your head and shoulders off the floor. °Do not overdo your exercises, especially in the beginning. Exercises may cause you some mild back discomfort which lasts for a few minutes; however, if the pain is more severe, or lasts for more than 15 minutes, do not continue exercises until you see your caregiver.  Improvement with exercise therapy for back problems is slow.  °See your caregivers for assistance with developing a proper back exercise program. °Document Released: 12/27/2004 Document Revised: 02/11/2012 Document Reviewed: 09/20/2011 °ExitCare® Patient Information ©2014 [employer]. °Lumbosacral Strain °Lumbosacral strain is a strain of any of the parts that make up your lumbosacral vertebrae. Your lumbosacral vertebrae are the bones that make up the lower third of your backbone. Your lumbosacral vertebrae are held together by muscles and tough, fibrous tissue (ligaments).  °CAUSES  °A sudden blow to your back can cause lumbosacral strain. Also, anything that causes an excessive stretch of the muscles in the low back can cause this strain. This is typically seen when people exert themselves strenuously, fall, lift heavy objects, bend, or crouch repeatedly. °RISK FACTORS °· Physically demanding work. °· Participation in pushing or pulling sports or sports that require sudden twist of the back (tennis, golf, baseball). °· Weight lifting. °· Excessive lower back curvature. °· Forward-tilted pelvis. °· Weak back or abdominal muscles or both. °· Tight hamstrings. °SIGNS AND SYMPTOMS  °Lumbosacral strain may cause pain in the area of your injury or pain that moves (radiates) down your leg.  °DIAGNOSIS °Your health care provider can often diagnose lumbosacral strain through a physical exam. In some cases, you may need tests such as X-ray exams.  °TREATMENT  °Treatment for your lower back injury depends on many factors that your clinician will have to   evaluate. However, most treatment will include the use of anti-inflammatory medicines. °HOME CARE INSTRUCTIONS  °· Avoid hard physical activities (tennis, racquetball, waterskiing) if you are not in proper physical condition for it. This may aggravate or create problems. °· If you have a back problem, avoid sports requiring sudden body movements. Swimming and walking are  generally safer activities. °· Maintain good posture. °· Maintain a healthy weight. °· For acute conditions, you may put ice on the injured area. °· Put ice in a plastic bag. °· Place a towel between your skin and the bag. °· Leave the ice on for 20 minutes, 2 3 times a day. °· When the low back starts healing, stretching and strengthening exercises may be recommended. °SEEK MEDICAL CARE IF: °· Your back pain is getting worse. °· You experience severe back pain not relieved with medicines. °SEEK IMMEDIATE MEDICAL CARE IF:  °· You have numbness, tingling, weakness, or problems with the use of your arms or legs. °· There is a change in bowel or bladder control. °· You have increasing pain in any area of the body, including your belly (abdomen). °· You notice shortness of breath, dizziness, or feel faint. °· You feel sick to your stomach (nauseous), are throwing up (vomiting), or become sweaty. °· You notice discoloration of your toes or legs, or your feet get very cold. °MAKE SURE YOU:  °· Understand these instructions. °· Will watch your condition. °· Will get help right away if you are not doing well or get worse. °Document Released: 08/29/2005 Document Revised: 09/09/2013 Document Reviewed: 07/08/2013 °ExitCare® Patient Information ©2014 [employer]. ° °

## 2013-12-26 NOTE — Progress Notes (Signed)
Chief Complaint:  Chief Complaint  Patient presents with  . Back Pain    x 1 week   No Known injury    HPI: [name] [name] is a 28 y.o. female who is here for lower back pain concentrated in the middle for one week. She is a Lawyer at [employer], and [employer]. She doesn't remember injuring it, but says she stands a lot at both of her jobs. She states it is worse when standing for long periods of time and when sitting. 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  She says it feels like it is catching, throbbing, sharp,  "catching" and going to give out. She hasn't taken any medications for this pain. She states it is radiating down her right leg. She states she does have a hx of mild scoliosis. She denies previous back injury. She denies tingling, numbness, or weakness in that leg.  She also complains of a tension HA for 2 days.   Past Medical History  Diagnosis Date  . Heart murmur    Past Surgical History  Procedure Laterality Date  . Bunionectomy  2005   History   Social History  . Marital Status: Single    Spouse Name: n/a    Number of Children: 1  . Years of Education: N/A   Occupational History  . Lab Geophysicist/field seismologist  . home health care     plans to go back to school in Nursing   Social History Main Topics  . Smoking status: Never Smoker   . Smokeless tobacco: Never Used  . Alcohol Use: No  . Drug Use: No  . Sexual Activity: No   Other Topics Concern  . None   Social History Narrative  . None   Family History  Problem Relation Age of Onset  . Hypertension Mother   . Thyroid disease Mother   . Hypertension Maternal Grandmother    No Known Allergies Prior to Admission medications   Medication Sig Start Date End Date Taking? Authorizing Provider  butalbital-acetaminophen-caffeine (FIORICET) 50-325-40 MG per tablet Take 1-2 tablets by mouth every 6 (six) hours as needed for headache. 04/13/13 04/13/14 Yes Thao P Le, DO  doxycycline  (VIBRAMYCIN) 100 MG capsule Take 1 capsule (100 mg total) by mouth 2 (two) times daily. 11/10/13  Yes Sherren Mocha, MD  HYDROcodone-acetaminophen (NORCO/VICODIN) 5-325 MG per tablet Take 1 tablet by mouth every 6 (six) hours as needed for moderate pain. 11/10/13  Yes Sherren Mocha, MD     ROS: The patient denies fevers, chills, night sweats, unintentional weight loss, chest pain, palpitations, wheezing, dyspnea on exertion, nausea, vomiting, abdominal pain, dysuria, hematuria, melena, numbness, weakness, or tingling.   All other systems have been reviewed and were otherwise negative with the exception of those mentioned in the HPI and as above.    PHYSICAL EXAM: Filed Vitals:   12/26/13 1202  BP: 105/64  Pulse: 63  Temp: 98.1 F (36.7 C)  Resp: 12   Filed Vitals:   12/26/13 1202  Height: 5' 1.5" (1.562 m)  Weight: 159 lb (72.122 kg)   Body mass index is 29.56 kg/(m^2).  General: Alert, no acute distress HEENT:  Normocephalic, atraumatic, oropharynx patent. EOMI, PERRLA Cardiovascular:  Regular rate and rhythm, no rubs murmurs or gallops.  No Carotid bruits, radial pulse intact. No pedal edema.  Respiratory: Clear to auscultation bilaterally.  No wheezes, rales, or rhonchi.  No cyanosis, no use of accessory musculature GI: No organomegaly, abdomen is soft and non-tender, positive bowel sounds.  No masses. Skin: No rashes. Neurologic: Facial musculature symmetric. Psychiatric: Patient is appropriate throughout our interaction. Lymphatic: No cervical lymphadenopathy Musculoskeletal: Gait intact. Tender para msk anat d mid back + paramsk tenderness  Decrease ROM in flexion 5/5 strength, 2/2 DTRs No saddle anesthesia Straight leg negative Hip and knee exam--normal   LABS: Results for orders placed in visit on 11/10/13  WOUND CULTURE      Result Value Range   Gram Stain No WBC Seen     Gram Stain No Squamous Epithelial Cells Seen     Gram Stain No Organisms Seen     Organism ID,  Bacteria NO GROWTH 2 DAYS       EKG/XRAY:   [facility]. Neg fx/dislocation Scoliosis  ASSESSMENT/PLAN: Encounter Diagnoses  Name Primary?  . Low back pain Yes  . Sprain and strain of back    Rx Naproxwen, flexeril, she alredady has norco from prior visit so can use that ROM exercises Work note given F/u prn  Gross sideeffects, risk and benefits, and alternatives of medications d/w patient. Patient is aware that all medications have potential sideeffects and we are unable to predict every sideeffect or drug-drug interaction that may occur.  Hamilton CapriLE, THAO PHUONG, DO 12/26/2013 2:34 PM

## 2014-04-26 ENCOUNTER — Emergency Department (HOSPITAL_BASED_OUTPATIENT_CLINIC_OR_DEPARTMENT_OTHER)
Admission: EM | Admit: 2014-04-26 | Discharge: 2014-04-26 | Disposition: A | Discharge status: Home / Self Care | Payer: 59 | Attending: Emergency Medicine | Admitting: Emergency Medicine

## 2014-04-26 ENCOUNTER — Encounter (HOSPITAL_BASED_OUTPATIENT_CLINIC_OR_DEPARTMENT_OTHER): Payer: Self-pay | Admitting: Emergency Medicine

## 2014-04-26 DIAGNOSIS — R519 Headache, unspecified: Secondary | ICD-10-CM

## 2014-04-26 DIAGNOSIS — Z792 Long term (current) use of antibiotics: Secondary | ICD-10-CM | POA: Insufficient documentation

## 2014-04-26 DIAGNOSIS — Z791 Long term (current) use of non-steroidal anti-inflammatories (NSAID): Secondary | ICD-10-CM | POA: Insufficient documentation

## 2014-04-26 DIAGNOSIS — R51 Headache: Secondary | ICD-10-CM | POA: Insufficient documentation

## 2014-04-26 DIAGNOSIS — Z79899 Other long term (current) drug therapy: Secondary | ICD-10-CM | POA: Insufficient documentation

## 2014-04-26 DIAGNOSIS — R011 Cardiac murmur, unspecified: Secondary | ICD-10-CM | POA: Insufficient documentation

## 2014-04-26 MED ORDER — FLUTICASONE PROPIONATE 50 MCG/ACT NA SUSP: 2.0000 | Freq: Every day | NASAL | Status: DC

## 2014-04-26 MED ORDER — METOCLOPRAMIDE HCL 10 MG PO TABS
10.0000 mg | ORAL_TABLET | Freq: Once | ORAL | Status: AC
  Administered 2014-04-26: 10 mg via ORAL
  Filled 2014-04-26: qty 1

## 2014-04-26 MED ORDER — DIPHENHYDRAMINE HCL 25 MG PO CAPS
25.0000 mg | ORAL_CAPSULE | Freq: Once | ORAL | Status: AC
  Administered 2014-04-26: 25 mg via ORAL
  Filled 2014-04-26: qty 1

## 2014-04-26 MED ORDER — KETOROLAC TROMETHAMINE 60 MG/2ML IM SOLN
60.0000 mg | Freq: Once | INTRAMUSCULAR | Status: AC
  Administered 2014-04-26: 60 mg via INTRAMUSCULAR
  Filled 2014-04-26: qty 2

## 2014-04-26 NOTE — ED Notes (Signed)
Headache. States she had a Toradol injection on Friday but pain is back.

## 2014-04-26 NOTE — Discharge Instructions (Signed)
Use Flonase as directed for your sinus congestion. It may also help to use Mucinex. Followup with your primary care physician.  Headaches, Frequently Asked Questions MIGRAINE HEADACHES Q: What is migraine? What causes it? How can I treat it? A: Generally, migraine headaches begin as a dull ache. Then they develop into a constant, throbbing, and pulsating pain. You may experience pain at the temples. You may experience pain at the front or back of one or both sides of the head. The pain is usually accompanied by a combination of:  Nausea.  Vomiting.  Sensitivity to light and noise. Some people (about 15%) experience an aura (see below) before an attack. The cause of migraine is believed to be chemical reactions in the brain. Treatment for migraine may include over-the-counter or prescription medications. It may also include self-help techniques. These include relaxation training and biofeedback.  Q: What is an aura? A: About 15% of people with migraine get an "aura". This is a sign of neurological symptoms that occur before a migraine headache. You may see wavy or jagged lines, dots, or flashing lights. You might experience tunnel vision or blind spots in one or both eyes. The aura can include visual or auditory hallucinations (something imagined). It may include disruptions in smell (such as strange odors), taste or touch. Other symptoms include:  Numbness.  A "pins and needles" sensation.  Difficulty in recalling or speaking the correct word. These neurological events may last as long as 60 minutes. These symptoms will fade as the headache begins. Q: What is a trigger? A: Certain physical or environmental factors can lead to or "trigger" a migraine. These include:  Foods.  Hormonal changes.  Weather.  Stress. It is important to remember that triggers are different for everyone. To help prevent migraine attacks, you need to figure out which triggers affect you. Keep a headache diary.  This is a good way to track triggers. The diary will help you talk to your healthcare professional about your condition. Q: Does weather affect migraines? A: Bright sunshine, hot, humid conditions, and drastic changes in barometric pressure may lead to, or "trigger," a migraine attack in some people. But studies have shown that weather does not act as a trigger for everyone with migraines. Q: What is the link between migraine and hormones? A: Hormones start and regulate many of your body's functions. Hormones keep your body in balance within a constantly changing environment. The levels of hormones in your body are unbalanced at times. Examples are during menstruation, pregnancy, or menopause. That can lead to a migraine attack. In fact, about three quarters of all women with migraine report that their attacks are related to the menstrual cycle.  Q: Is there an increased risk of stroke for migraine sufferers? A: The likelihood of a migraine attack causing a stroke is very remote. That is not to say that migraine sufferers cannot have a stroke associated with their migraines. In persons under age 28, the most common associated factor for stroke is migraine headache. But over the course of a person's normal life span, the occurrence of migraine headache may actually be associated with a reduced risk of dying from cerebrovascular disease due to stroke.  Q: What are acute medications for migraine? A: Acute medications are used to treat the pain of the headache after it has started. Examples over-the-counter medications, NSAIDs, ergots, and triptans.  Q: What are the triptans? A: Triptans are the newest class of abortive medications. They are specifically targeted to treat  migraine. Triptans are vasoconstrictors. They moderate some chemical reactions in the brain. The triptans work on receptors in your brain. Triptans help to restore the balance of a neurotransmitter called serotonin. Fluctuations in levels of  serotonin are thought to be a main cause of migraine.  Q: Are over-the-counter medications for migraine effective? A: Over-the-counter, or "OTC," medications may be effective in relieving mild to moderate pain and associated symptoms of migraine. But you should see your caregiver before beginning any treatment regimen for migraine.  Q: What are preventive medications for migraine? A: Preventive medications for migraine are sometimes referred to as "prophylactic" treatments. They are used to reduce the frequency, severity, and length of migraine attacks. Examples of preventive medications include antiepileptic medications, antidepressants, beta-blockers, calcium channel blockers, and NSAIDs (nonsteroidal anti-inflammatory drugs). Q: Why are anticonvulsants used to treat migraine? A: During the past few years, there has been an increased interest in antiepileptic drugs for the prevention of migraine. They are sometimes referred to as "anticonvulsants". Both epilepsy and migraine may be caused by similar reactions in the brain.  Q: Why are antidepressants used to treat migraine? A: Antidepressants are typically used to treat people with depression. They may reduce migraine frequency by regulating chemical levels, such as serotonin, in the brain.  Q: What alternative therapies are used to treat migraine? A: The term "alternative therapies" is often used to describe treatments considered outside the scope of conventional Western medicine. Examples of alternative therapy include acupuncture, acupressure, and yoga. Another common alternative treatment is herbal therapy. Some herbs are believed to relieve headache pain. Always discuss alternative therapies with your caregiver before proceeding. Some herbal products contain arsenic and other toxins. TENSION HEADACHES Q: What is a tension-type headache? What causes it? How can I treat it? A: Tension-type headaches occur randomly. They are often the result of  temporary stress, anxiety, fatigue, or anger. Symptoms include soreness in your temples, a tightening band-like sensation around your head (a "vice-like" ache). Symptoms can also include a pulling feeling, pressure sensations, and contracting head and neck muscles. The headache begins in your forehead, temples, or the back of your head and neck. Treatment for tension-type headache may include over-the-counter or prescription medications. Treatment may also include self-help techniques such as relaxation training and biofeedback. CLUSTER HEADACHES Q: What is a cluster headache? What causes it? How can I treat it? A: Cluster headache gets its name because the attacks come in groups. The pain arrives with little, if any, warning. It is usually on one side of the head. A tearing or bloodshot eye and a runny nose on the same side of the headache may also accompany the pain. Cluster headaches are believed to be caused by chemical reactions in the brain. They have been described as the most severe and intense of any headache type. Treatment for cluster headache includes prescription medication and oxygen. SINUS HEADACHES Q: What is a sinus headache? What causes it? How can I treat it? A: When a cavity in the bones of the face and skull (a sinus) becomes inflamed, the inflammation will cause localized pain. This condition is usually the result of an allergic reaction, a tumor, or an infection. If your headache is caused by a sinus blockage, such as an infection, you will probably have a fever. An x-ray will confirm a sinus blockage. Your caregiver's treatment might include antibiotics for the infection, as well as antihistamines or decongestants.  REBOUND HEADACHES Q: What is a rebound headache? What causes it? How can  I treat it? A: A pattern of taking acute headache medications too often can lead to a condition known as "rebound headache." A pattern of taking too much headache medication includes taking it more  than 2 days per week or in excessive amounts. That means more than the label or a caregiver advises. With rebound headaches, your medications not only stop relieving pain, they actually begin to cause headaches. Doctors treat rebound headache by tapering the medication that is being overused. Sometimes your caregiver will gradually substitute a different type of treatment or medication. Stopping may be a challenge. Regularly overusing a medication increases the potential for serious side effects. Consult a caregiver if you regularly use headache medications more than 2 days per week or more than the label advises. ADDITIONAL QUESTIONS AND ANSWERS Q: What is biofeedback? A: Biofeedback is a self-help treatment. Biofeedback uses special equipment to monitor your body's involuntary physical responses. Biofeedback monitors:  Breathing.  Pulse.  Heart rate.  Temperature.  Muscle tension.  Brain activity. Biofeedback helps you refine and perfect your relaxation exercises. You learn to control the physical responses that are related to stress. Once the technique has been mastered, you do not need the equipment any more. Q: Are headaches hereditary? A: Four out of five (80%) of people that suffer report a family history of migraine. Scientists are not sure if this is genetic or a family predisposition. Despite the uncertainty, a child has a 50% chance of having migraine if one parent suffers. The child has a 75% chance if both parents suffer.  Q: Can children get headaches? A: By the time they reach high school, most young people have experienced some type of headache. Many safe and effective approaches or medications can prevent a headache from occurring or stop it after it has begun.  Q: What type of doctor should I see to diagnose and treat my headache? A: Start with your primary caregiver. Discuss his or her experience and approach to headaches. Discuss methods of classification, diagnosis, and  treatment. Your caregiver may decide to recommend you to a headache specialist, depending upon your symptoms or other physical conditions. Having diabetes, allergies, etc., may require a more comprehensive and inclusive approach to your headache. The National Headache Foundation will provide, upon request, a list of [facility] physician members in your state. Document Released: 02/09/2004 Document Revised: 02/11/2012 Document Reviewed: 07/19/2008 [facility] Patient Information 2014 [place], [place].  Sinus Headache A sinus headache is when your sinuses become clogged or swollen. Sinus headaches can range from mild to severe.  CAUSES A sinus headache can have different causes, such as:  Colds.  Sinus infections.  Allergies. SYMPTOMS  Symptoms of a sinus headache may vary and can include:  Headache.  Pain or pressure in the face.  Congested or runny nose.  Fever.  Inability to smell.  Pain in upper teeth. Weather changes can make symptoms worse. TREATMENT  The treatment of a sinus headache depends on the cause.  Sinus pain caused by a sinus infection may be treated with antibiotic medicine.  Sinus pain caused by allergies may be helped by allergy medicines (antihistamines) and medicated nasal sprays.  Sinus pain caused by congestion may be helped by flushing the nose and sinuses with saline solution. HOME CARE INSTRUCTIONS   If antibiotics are prescribed, take them as directed. Finish them even if you start to feel better.  Only take over-the-counter or prescription medicines for pain, discomfort, or fever as directed by your caregiver.  If you  have congestion, use a nasal spray to help reduce pressure. SEEK IMMEDIATE MEDICAL CARE IF:  You have a fever.  You have headaches more than once a week.  You have sensitivity to light or sound.  You have repeated nausea and vomiting.  You have vision problems.  You have sudden, severe pain in your face or head.  You have a  seizure.  You are confused.  Your sinus headaches do not get better after treatment. Many people think they have a sinus headache when they actually have migraines or tension headaches. MAKE SURE YOU:   Understand these instructions.  Will watch your condition.  Will get help right away if you are not doing well or get worse. Document Released: 12/27/2004 Document Revised: 02/11/2012 Document Reviewed: 02/17/2011 [facility] Patient Information 2014 [place], [place].

## 2014-04-26 NOTE — ED Provider Notes (Signed)
CSN: [id number]     Arrival date & time 04/26/14  1618 History   First MD Initiated Contact with Patient 04/26/14 1700     Chief Complaint  Patient presents with  . Headache     (Consider location/radiation/quality/duration/timing/severity/associated sxs/prior Treatment) HPI Comments: 28 year old female with a past medical history of tension headaches presents to the emergency department planing of headache x4 days. Patient states she was seen at an urgent care 4 days ago and given a Toradol injection, this relieved her pain for a very short period of time and a headache returned. Headache described as throbbing starting in the front of her head radiating around both sides and is constant. She has not had any other alleviating factors. Admits to associated photophobia. Denies photophobia, nausea, vomiting, vision change, confusion, neck pain, weakness or numbness.  Patient is a 28 y.o. female presenting with headaches. The history is provided by the patient.  Headache   Past Medical History  Diagnosis Date  . Heart murmur    Past Surgical History  Procedure Laterality Date  . Bunionectomy  2005   Family History  Problem Relation Age of Onset  . Hypertension Mother   . Thyroid disease Mother   . Hypertension Maternal Grandmother    History  Substance Use Topics  . Smoking status: Never Smoker   . Smokeless tobacco: Never Used  . Alcohol Use: No   OB History   Grav Para Term Preterm Abortions TAB SAB Ect Mult Living                 Review of Systems  Neurological: Positive for headaches.  All other systems reviewed and are negative.     Allergies  Review of patient's allergies indicates no known allergies.  Home Medications   Prior to Admission medications   Medication Sig Start Date End Date Taking? Authorizing Provider  cyclobenzaprine (FLEXERIL) 5 MG tablet Take 1 tablet (5 mg total) by mouth at bedtime as needed. 12/26/13   Thao P Le, DO  doxycycline  (VIBRAMYCIN) 100 MG capsule Take 1 capsule (100 mg total) by mouth 2 (two) times daily. 11/10/13   Sherren Mocha, MD  HYDROcodone-acetaminophen (NORCO/VICODIN) 5-325 MG per tablet Take 1 tablet by mouth every 6 (six) hours as needed for moderate pain. 11/10/13   Sherren Mocha, MD  naproxen (NAPROSYN) 500 MG tablet Take 1 tablet (500 mg total) by mouth 2 (two) times daily with a meal. Do not take with other NSAIDs 12/26/13   Thao P Le, DO   BP 113/75  Pulse 66  Temp(Src) 98.6 F (37 C) (Oral)  Resp 20  Ht 5\' 1"  (1.549 m)  Wt 159 lb (72.122 kg)  BMI 30.06 kg/m2  SpO2 100%  LMP 04/16/2014 Physical Exam  Nursing note and vitals reviewed. Constitutional: She is oriented to person, place, and time. She appears well-developed and well-nourished. No distress.  HENT:  Head: Normocephalic and atraumatic.  Nose: Mucosal edema present. Right sinus exhibits frontal sinus tenderness. Left sinus exhibits frontal sinus tenderness.  Mouth/Throat: Oropharynx is clear and moist.  Headache exacerbated by leaning forward.  Eyes: Conjunctivae and EOM are normal. Pupils are equal, round, and reactive to light.  Neck: Normal range of motion. Neck supple.  No meningeal signs.  Cardiovascular: Normal rate, regular rhythm, normal heart sounds and intact distal pulses.   Pulmonary/Chest: Effort normal and breath sounds normal. No respiratory distress.  Musculoskeletal: Normal range of motion. She exhibits no edema.  Neurological: She is  alert and oriented to person, place, and time. She has normal strength. No cranial nerve deficit or sensory deficit. Coordination normal.  Speech fluent, goal oriented. Moves limbs without ataxia. Equal grip strength bilateral.  Skin: Skin is warm and dry. She is not diaphoretic.  Psychiatric: She has a normal mood and affect. Her behavior is normal.    ED Course  Procedures (including critical care time) Labs Review Labs Reviewed - No data to display  Imaging Review No results  found.   EKG Interpretation None      MDM   Final diagnoses:  Headache  Sinus headache    Patient presenting with headache, history of tension headaches. She is well appearing and in no apparent distress. Afebrile, vital signs stable. No red flags concerning patient's headache. No focal neurologic deficits, afebrile, no meningeal signs. Symptoms exacerbated by leaning forward, tenderness to frontal sinuses. Mucosal edema noted. Symptoms may be related to sinusitis. Plan to treat symptoms and reevaluate. 6:58 PM Patient reports improvement of her symptoms after receiving Toradol, Reglan and Benadryl. States she is feeling much better. Stable for discharge. Will prescribe Flonase for sinuses. Followup with PCP. Return precautions given. Patient states understanding of treatment care plan and is agreeable.  Trevor MaceRobyn M Albert, PA-C 04/26/14 1859

## 2014-04-27 NOTE — ED Provider Notes (Signed)
Medical screening examination/treatment/procedure(s) were performed by non-physician practitioner and as supervising physician I was immediately available for consultation/collaboration.   EKG Interpretation None        Audree Camel, MD 04/27/14 1737

## 2014-07-21 ENCOUNTER — Ambulatory Visit (INDEPENDENT_AMBULATORY_CARE_PROVIDER_SITE_OTHER): Payer: 59 | Admitting: Family Medicine

## 2014-07-21 ENCOUNTER — Ambulatory Visit (INDEPENDENT_AMBULATORY_CARE_PROVIDER_SITE_OTHER): Payer: 59

## 2014-07-21 ENCOUNTER — Emergency Department: Payer: Self-pay | Admitting: Emergency Medicine

## 2014-07-21 VITALS — BP 122/74 | HR 81 | Temp 98.0°F | Resp 17 | Ht 62.0 in | Wt 157.0 lb

## 2014-07-21 DIAGNOSIS — K5900 Constipation, unspecified: Secondary | ICD-10-CM | POA: Insufficient documentation

## 2014-07-21 DIAGNOSIS — R109 Unspecified abdominal pain: Secondary | ICD-10-CM

## 2014-07-21 DIAGNOSIS — R1030 Lower abdominal pain, unspecified: Secondary | ICD-10-CM

## 2014-07-21 LAB — POCT UA - MICROSCOPIC ONLY
CASTS, UR, LPF, POC: NEGATIVE
CRYSTALS, UR, HPF, POC: NEGATIVE
WBC, Ur, HPF, POC: NEGATIVE
Yeast, UA: NEGATIVE

## 2014-07-21 LAB — POCT CBC
Granulocyte percent: 69.4 %G (ref 37–80)
HCT, POC: 40.4 % (ref 37.7–47.9)
HEMOGLOBIN: 12.9 g/dL (ref 12.2–16.2)
LYMPH, POC: 1.9 (ref 0.6–3.4)
MCH, POC: 28.1 pg (ref 27–31.2)
MCHC: 31.8 g/dL (ref 31.8–35.4)
MCV: 88.3 fL (ref 80–97)
MID (cbc): 0.5 (ref 0–0.9)
MPV: 7.8 fL (ref 0–99.8)
POC GRANULOCYTE: 5.4 (ref 2–6.9)
POC LYMPH PERCENT: 24.4 %L (ref 10–50)
POC MID %: 6.2 % (ref 0–12)
Platelet Count, POC: 304 10*3/uL (ref 142–424)
RBC: 4.58 M/uL (ref 4.04–5.48)
RDW, POC: 13.5 %
WBC: 7.8 10*3/uL (ref 4.6–10.2)

## 2014-07-21 LAB — POCT URINALYSIS DIPSTICK
Bilirubin, UA: NEGATIVE
Blood, UA: NEGATIVE
Glucose, UA: NEGATIVE
Ketones, UA: NEGATIVE
LEUKOCYTES UA: NEGATIVE
Nitrite, UA: NEGATIVE
PH UA: 6
Spec Grav, UA: 1.02
Urobilinogen, UA: 0.2

## 2014-07-21 LAB — IFOBT (OCCULT BLOOD): IFOBT: NEGATIVE

## 2014-07-21 IMAGING — CR DG ABDOMEN 2V
2 series · 2 of 2 positions shown · non-contrast
Comparison: [DATE]

CLINICAL DATA: Abdominal pain, constipation.

EXAM:
ABDOMEN - 2 VIEW

[AP (1 of 2)]
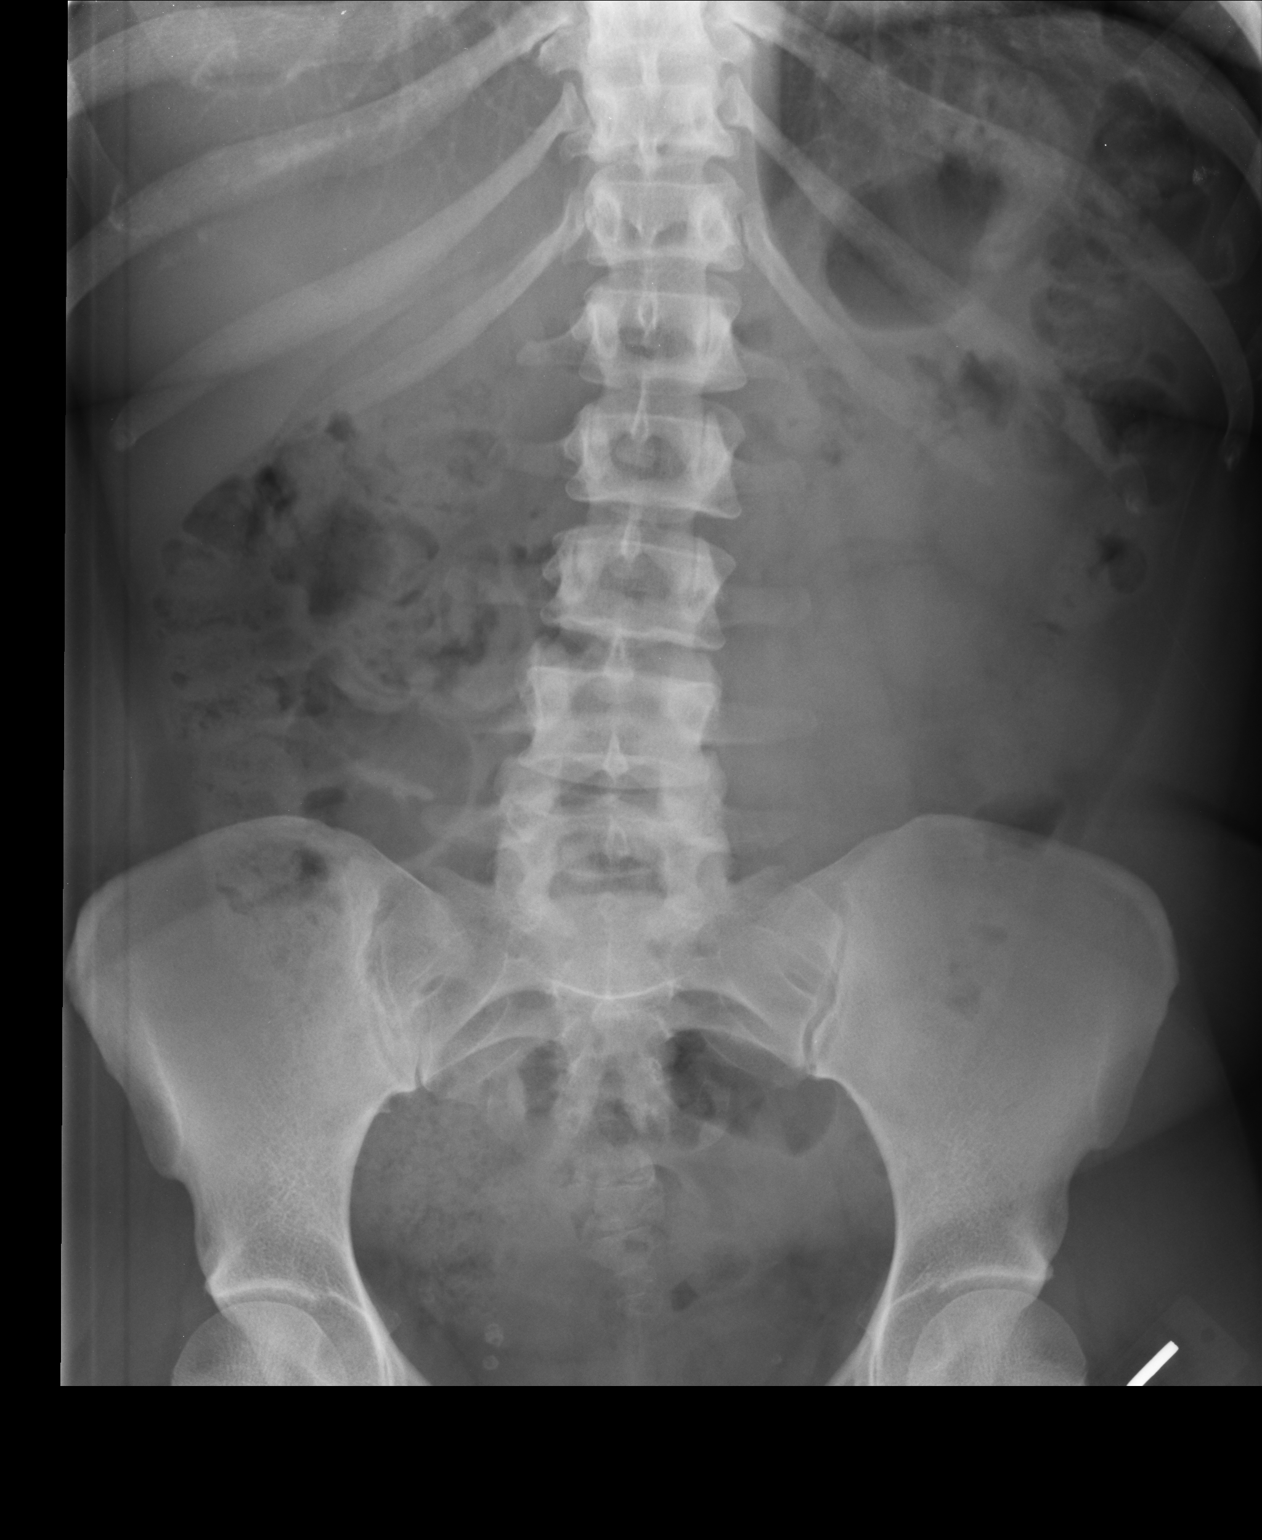

[AP (2 of 2)]
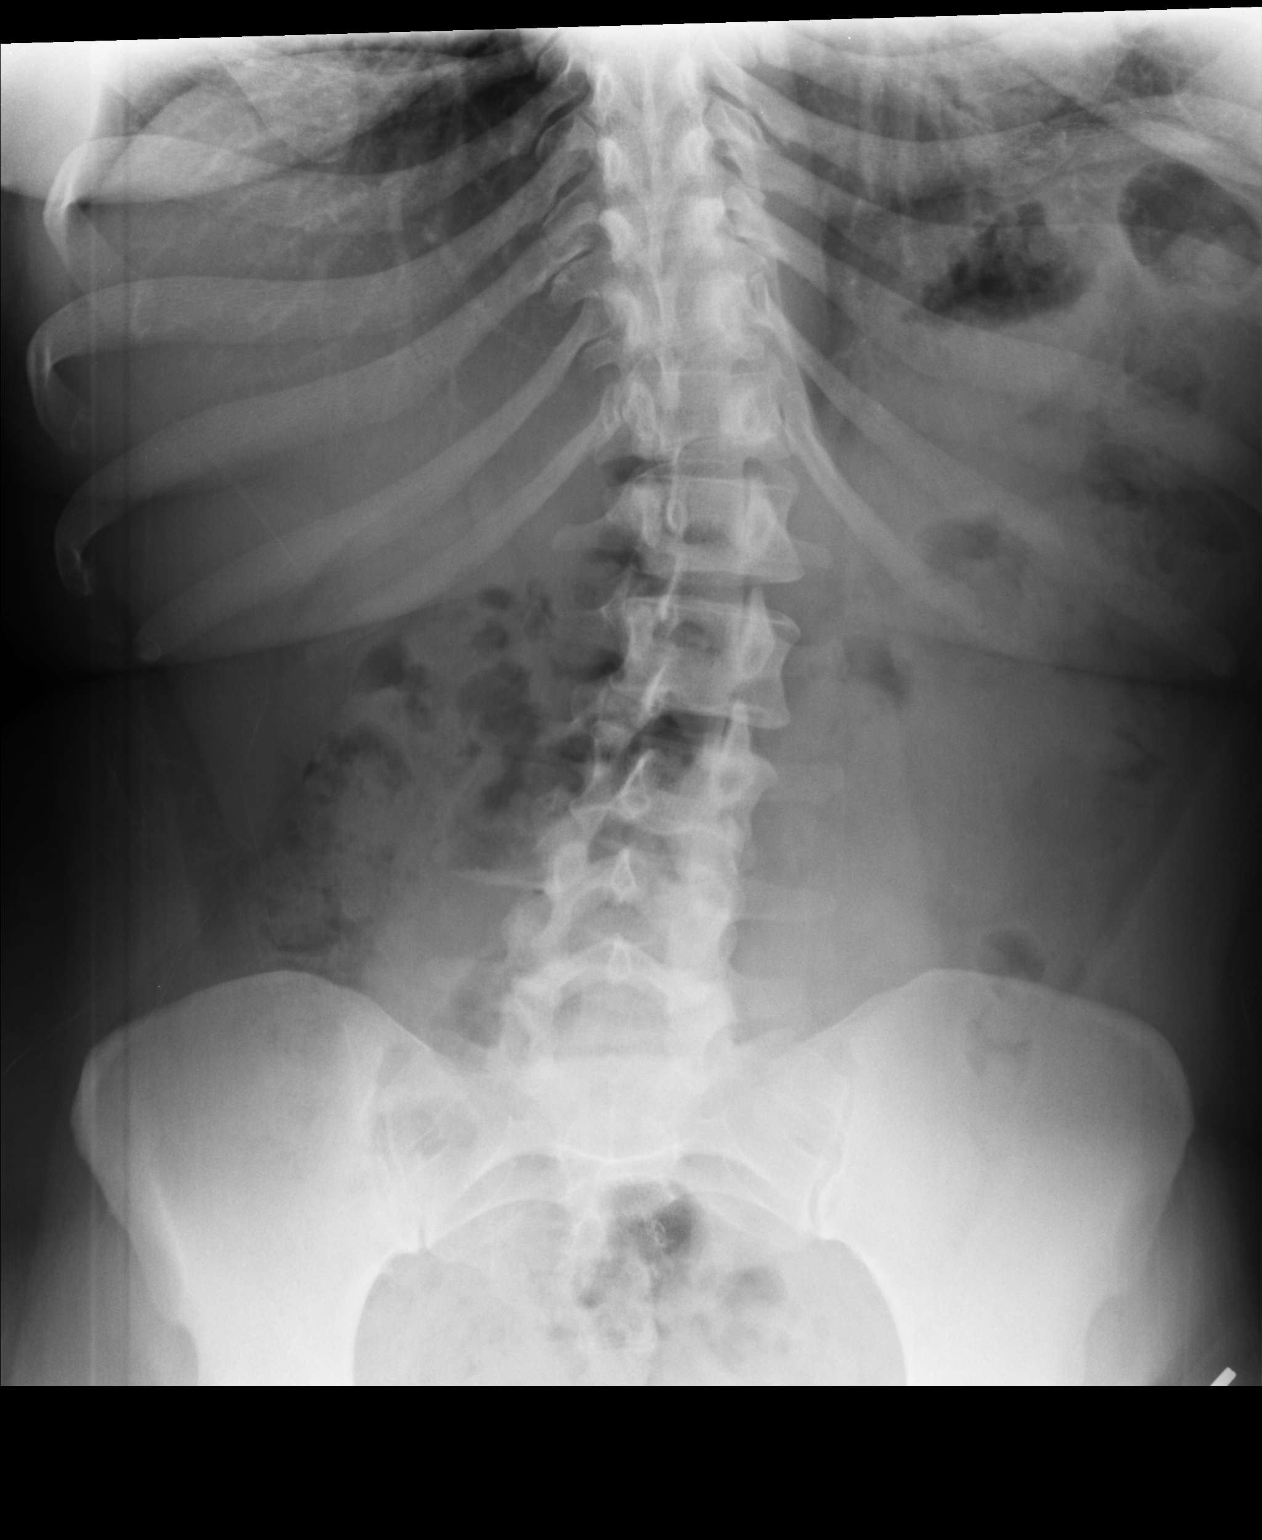

[2 of 2 positions shown; findings below may reference images not displayed]

FINDINGS: There is normal bowel gas pattern. No free air. No organomegaly or
suspicious calcification. No acute bony abnormality.
IMPRESSION: Negative.

## 2014-07-21 NOTE — Patient Instructions (Signed)
Use fleets enemas now and this evening  Take miralax one dose twice daily today, then one time daily until stools are loose, then use on an as needed basis  Avoid excessive cheese and bananas as they tend to constipate.  Most fruits, melons and vegetables are good for the bowels.  Drink lots of fluids.    Return if worse at any time or go to ER if needed.  If constipation continues to be a problem we may need further evaluation or referral to specialist.  Constipation Constipation is when a person has fewer than three bowel movements a week, has difficulty having a bowel movement, or has stools that are dry, hard, or larger than normal. As people grow older, constipation is more common. If you try to fix constipation with medicines that make you have a bowel movement (laxatives), the problem may get worse. Long-term laxative use may cause the muscles of the colon to become weak. A low-fiber diet, not taking in enough fluids, and taking certain medicines may make constipation worse.  CAUSES   Certain medicines, such as antidepressants, pain medicine, iron supplements, antacids, and water pills.   Certain diseases, such as diabetes, irritable bowel syndrome (IBS), thyroid disease, or depression.   Not drinking enough water.   Not eating enough fiber-rich foods.   Stress or travel.   Lack of physical activity or exercise.   Ignoring the urge to have a bowel movement.   Using laxatives too much.  SIGNS AND SYMPTOMS   Having fewer than three bowel movements a week.   Straining to have a bowel movement.   Having stools that are hard, dry, or larger than normal.   Feeling full or bloated.   Pain in the lower abdomen.   Not feeling relief after having a bowel movement.  DIAGNOSIS  Your health care provider will take a medical history and perform a physical exam. Further testing may be done for severe constipation. Some tests may include:  A barium enema X-ray to  examine your rectum, colon, and, sometimes, your small intestine.   A sigmoidoscopy to examine your lower colon.   A colonoscopy to examine your entire colon. TREATMENT  Treatment will depend on the severity of your constipation and what is causing it. Some dietary treatments include drinking more fluids and eating more fiber-rich foods. Lifestyle treatments may include regular exercise. If these diet and lifestyle recommendations do not help, your health care provider may recommend taking over-the-counter laxative medicines to help you have bowel movements. Prescription medicines may be prescribed if over-the-counter medicines do not work.  HOME CARE INSTRUCTIONS   Eat foods that have a lot of fiber, such as fruits, vegetables, whole grains, and beans.  Limit foods high in fat and processed sugars, such as french fries, hamburgers, cookies, candies, and soda.   A fiber supplement may be added to your diet if you cannot get enough fiber from foods.   Drink enough fluids to keep your urine clear or pale yellow.   Exercise regularly or as directed by your health care provider.   Go to the restroom when you have the urge to go. Do not hold it.   Only take over-the-counter or prescription medicines as directed by your health care provider. Do not take other medicines for constipation without talking to your health care provider first.  SEEK IMMEDIATE MEDICAL CARE IF:   You have bright red blood in your stool.   Your constipation lasts for more than 4 days  or gets worse.   You have abdominal or rectal pain.   You have thin, pencil-like stools.   You have unexplained weight loss. MAKE SURE YOU:   Understand these instructions.  Will watch your condition.  Will get help right away if you are not doing well or get worse. Document Released: 08/17/2004 Document Revised: 11/24/2013 Document Reviewed: 08/31/2013 [employer] [employer] Patient Information 2015 [employer] [place], [place]. This  information is not intended to replace advice given to you by your health care provider. Make sure you discuss any questions you have with your health care provider.

## 2014-07-21 NOTE — Progress Notes (Signed)
Subjective: [age] year old lady who works at [employer]. She is generally healthy. For the past 3 days she has been constipated. She cannot pass anything. Just stopped completely. She said she usually been having twice a day BMs of fairly normal consistency prior to then. Has not passed any blood. She's been trying to go but cannot. She has taken milk of magnesia without response. She has intermittent waxing and waning of pain across lower abdomen. She's not able to sleep because of this. She did work yesterday. She has one child. Her last menstrual cycles just ended. She is not on any regular medications. She does have some Flexeril, but has not taken it over the last week.  Objective: Uncomfortable young lady. Chest clear. Heart regular without murmur. Abdomen has normal bowel sounds but they were definitely present in all 4 quadrants. No tinkles or rushes. Abdomen is nontender to percussion facies tender across the low abdomen, slightly more left lower quadrant it seems. Digital exam reveals no stool in the rectum. Occult testing performed.  Assessment: Constipation Abdominal pain  Plan:  CBC, urinalysis, Cmet, 2 view abdomen  Results for orders placed in visit on 07/21/14  POCT CBC      Result Value Ref Range   WBC 7.8  4.6 - 10.2 K/uL   Lymph, poc 1.9  0.6 - 3.4   POC LYMPH PERCENT 24.4  10 - 50 %L   MID (cbc) 0.5  0 - 0.9   POC MID % 6.2  0 - 12 %M   POC Granulocyte 5.4  2 - 6.9   Granulocyte percent 69.4  37 - 80 %G   RBC 4.58  4.04 - 5.48 M/uL   Hemoglobin 12.9  12.2 - 16.2 g/dL   HCT, POC 78.240.4  95.637.7 - 47.9 %   MCV 88.3  80 - 97 fL   MCH, POC 28.1  27 - 31.2 pg   MCHC 31.8  31.8 - 35.4 g/dL   RDW, POC 21.313.5     Platelet Count, POC 304  142 - 424 K/uL   MPV 7.8  0 - 99.8 fL  POCT UA - MICROSCOPIC ONLY      Result Value Ref Range   WBC, Ur, HPF, POC neg     RBC, urine, microscopic 0-1     Bacteria, U Microscopic 1+     Mucus, UA small     Epithelial cells, urine per micros 1-6      Crystals, Ur, HPF, POC neg     Casts, Ur, LPF, POC neg     Yeast, UA neg    POCT URINALYSIS DIPSTICK      Result Value Ref Range   Color, UA yellow     Clarity, UA clear     Glucose, UA neg     Bilirubin, UA neg     Ketones, UA neg     Spec Grav, UA 1.020     Blood, UA neg     pH, UA 6.0     Protein, UA trace     Urobilinogen, UA 0.2     Nitrite, UA neg     Leukocytes, UA Negative    IFOBT (OCCULT BLOOD)      Result Value Ref Range   IFOBT Negative     [facility] reading (PRIMARY) by  Dr. Alwyn RenHopper Consistent with constipation.  No air fluid levels or loops of bowel noted.  Rotation of film.  Assessment: Constipation  Plan: Fleets Miralax colace.

## 2014-07-22 ENCOUNTER — Encounter: Payer: Self-pay | Admitting: *Deleted

## 2014-07-22 LAB — COMPREHENSIVE METABOLIC PANEL
ALT: 16 U/L (ref 0–35)
AST: 17 U/L (ref 0–37)
Albumin: 4.8 g/dL (ref 3.5–5.2)
Alkaline Phosphatase: 59 U/L (ref 39–117)
BUN: 8 mg/dL (ref 6–23)
CO2: 26 mEq/L (ref 19–32)
Calcium: 9.5 mg/dL (ref 8.4–10.5)
Chloride: 101 mEq/L (ref 96–112)
Creat: 0.73 mg/dL (ref 0.50–1.10)
Glucose, Bld: 93 mg/dL (ref 70–99)
Potassium: 4.5 mEq/L (ref 3.5–5.3)
Sodium: 139 mEq/L (ref 135–145)
Total Bilirubin: 0.7 mg/dL (ref 0.2–1.2)
Total Protein: 7.4 g/dL (ref 6.0–8.3)

## 2014-08-13 ENCOUNTER — Encounter: Payer: Self-pay | Admitting: Diagnostic Neuroimaging

## 2014-08-13 ENCOUNTER — Ambulatory Visit (INDEPENDENT_AMBULATORY_CARE_PROVIDER_SITE_OTHER): Payer: 59 | Admitting: Diagnostic Neuroimaging

## 2014-08-13 VITALS — BP 111/79 | HR 74 | Ht 62.0 in | Wt 159.6 lb

## 2014-08-13 DIAGNOSIS — M5430 Sciatica, unspecified side: Secondary | ICD-10-CM

## 2014-08-13 DIAGNOSIS — M5441 Lumbago with sciatica, right side: Secondary | ICD-10-CM

## 2014-08-13 NOTE — Patient Instructions (Signed)
Piriformis Syndrome with Rehab Piriformis syndrome is a condition the affects the nervous system in the area of the hip, and is characterized by pain and possibly a loss of feeling in the backside (posterior) thigh that may extend down the entire length of the leg. The symptoms are caused by an increase in pressure on the sciatic nerve by the piriformis muscle, which is on the back of the hip and is responsible for externally rotating the hip. The sciatic  nerve and its branches connect to much of the leg. Normally the sciatic nerve runs between the piriformis muscle and other muscles. However, in certain individuals the nerve runs through the muscle, which causes an increase in pressure on the nerve and results in the symptoms of piriformis syndrome. SYMPTOMS   Pain, tingling, numbness, or burning in the back of the thigh that may also extend down the entire leg.  Occasionally, tenderness in the buttock.  Loss of function of the leg.  Pain that worsens when using the piriformis muscle (running, jumping, or stairs).  Pain that increases with prolonged sitting.  Pain that is lessened by lying flat on the back. CAUSES   Piriformis syndrome is the result of an increase in pressure placed on the sciatic nerve. Oftentimes, piriformis syndrome is an overuse injury.  Stress placed on the nerve from a sudden increase in the intensity, frequency, or duration of training.  Compensation of other extremity injuries. RISK INCREASES WITH:  Sports that involve the piriformis muscle (running, walking, or jumping).  You are born with (congenital) a defect in which the sciatic nerve passes through the muscle. PREVENTION  Warm up and stretch properly before activity.  Allow for adequate recovery between workouts.  Maintain physical fitness:  Strength, flexibility, and endurance.  Cardiovascular fitness. PROGNOSIS  If treated properly, the symptoms of piriformis syndrome usually resolve in 2 to  6 weeks. RELATED COMPLICATIONS   Persistent and possibly permanent pain and numbness in the lower extremity.  Weakness of the extremity that may progress to disability and inability to compete. TREATMENT  The most effective treatment for piriformis syndrome is rest from any activities that aggravate the symptoms. Ice and pain medication may help reduce pain and inflammation. The use of strengthening and stretching exercises may help reduce pain with activity. These exercises may be performed at home or with a therapist. A referral to a therapist may be given for further evaluation and treatment, such as ultrasound. Corticosteroid injections may be given to reduce inflammation that is causing pressure to be placed on the sciatic nerve. If nonsurgical (conservative) treatment is unsuccessful, then surgery may be recommended.  MEDICATION   If pain medication is necessary, then nonsteroidal anti-inflammatory medications, such as aspirin and ibuprofen, or other minor pain relievers, such as acetaminophen, are often recommended.  Do not take pain medication for 7 days before surgery.  Prescription pain relievers may be given if deemed necessary by your caregiver. Use only as directed and only as much as you need.  Corticosteroid injections may be given by your caregiver. These injections should be reserved for the most serious cases, because they may only be given a certain number of times. HEAT AND COLD:   Cold treatment (icing) relieves pain and reduces inflammation. Cold treatment should be applied for 10 to 15 minutes every 2 to 3 hours for inflammation and pain and immediately after any activity that aggravates your symptoms. Use ice packs or massage the area with a piece of ice (ice massage).  Heat treatment may be used prior to performing the stretching and strengthening activities prescribed by your caregiver, physical therapist, or athletic trainer. Use a heat pack or soak the injury in warm  water.

## 2014-08-13 NOTE — Progress Notes (Signed)
[facility]  PATIENT: [name] [name] DOB: Oct 02, 1986  REFERRING CLINICIAN: Aguiar  HISTORY FROM: patient  REASON FOR VISIT: new consult    HISTORICAL  CHIEF COMPLAINT:  Chief Complaint  Patient presents with  . Back Pain    NP#    HISTORY OF PRESENT ILLNESS:   28 year old right-handed female here for alteration of low-back pain. For past 2-3 months patient has had new onset low back pain radiating to the right leg. Patient had similar symptoms in 2011 with fluctuating pain. She tried physical therapy in 2011. More recently patient has been using tramadol as needed for pain. No weakness. Symptoms are worse when sitting for a long time, or standing up for a long time.    REVIEW OF SYSTEMS: Full 14 system review of systems performed and notable only for dizziness.  ALLERGIES: No Known Allergies  HOME MEDICATIONS: Outpatient Prescriptions Prior to Visit  Medication Sig Dispense Refill  . cyclobenzaprine (FLEXERIL) 5 MG tablet Take 1 tablet (5 mg total) by mouth at bedtime as needed.  30 tablet  0  . doxycycline (VIBRAMYCIN) 100 MG capsule Take 1 capsule (100 mg total) by mouth 2 (two) times daily.  20 capsule  0   No facility-administered medications prior to visit.    PAST MEDICAL HISTORY: Past Medical History  Diagnosis Date  . Heart murmur   . Headache(784.0)     PAST SURGICAL HISTORY: Past Surgical History  Procedure Laterality Date  . Bunionectomy  2005    FAMILY HISTORY: Family History  Problem Relation Age of Onset  . Hypertension Mother   . Thyroid disease Mother   . Hypertension Maternal Grandmother     SOCIAL HISTORY:  History   Social History  . Marital Status: Single    Spouse Name: n/a    Number of Children: 1  . Years of Education: 12th   Occupational History  . 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 [employer]   Social History Main Topics  . Smoking  status: Never Smoker   . Smokeless tobacco: Never Used  . Alcohol Use: No  . Drug Use: No  . Sexual Activity: No   Other Topics Concern  . Not on file   Social History Narrative  . No narrative on file     PHYSICAL EXAM  Filed Vitals:   08/13/14 1027  BP: 111/79  Pulse: 74  Height:  (1.575 m)  Weight: 159 lb 9.6 oz (72.394 kg)    Not recorded    Body mass index is 29.18 kg/(m^2).  GENERAL EXAM: Patient is in no distress; well developed, nourished and groomed; neck is supple; POSITIVE STRAIGHT LEG RAISE ON RIGHT; POSITIVE FABER AND FADIR ON RIGHT.   CARDIOVASCULAR: Regular rate and rhythm, no murmurs, no carotid bruits  NEUROLOGIC: MENTAL STATUS: awake, alert, oriented to person, place and time, recent and remote memory intact, normal attention and concentration, language fluent, comprehension intact, naming intact, fund of knowledge appropriate CRANIAL NERVE: no papilledema on fundoscopic exam, pupils equal and reactive to light, visual fields full to confrontation, extraocular muscles intact, no nystagmus, facial sensation and strength symmetric, hearing intact, palate elevates symmetrically, uvula midline, shoulder shrug symmetric, tongue midline. MOTOR: normal bulk and tone, full strength in the BUE, BLE SENSORY: normal and symmetric to light touch, pinprick, temperature, vibration  COORDINATION: finger-nose-finger, fine finger movements normal  REFLEXES: deep tendon reflexes present and symmetric GAIT/STATION: narrow based gait; able to walk on toes, heels and tandem; romberg is negative    DIAGNOSTIC DATA (LABS, IMAGING, TESTING) - I reviewed patient records, labs, notes, testing and imaging myself where available.  Lab Results  Component Value Date   WBC 7.8 07/21/2014   HGB 12.9 07/21/2014   HCT 40.4 07/21/2014   MCV 88.3 07/21/2014   PLT 163 09/27/2007      Component Value Date/Time   NA 139 07/21/2014 1138   K 4.5 07/21/2014 1138   CL 101 07/21/2014  1138   CO2 26 07/21/2014 1138   GLUCOSE 93 07/21/2014 1138   BUN 8 07/21/2014 1138   CREATININE 0.73 07/21/2014 1138   CREATININE 0.72 08/24/2012 0356   CALCIUM 9.5 07/21/2014 1138   PROT 7.4 07/21/2014 1138   ALBUMIN 4.8 07/21/2014 1138   AST 17 07/21/2014 1138   ALT 16 07/21/2014 1138   ALKPHOS 59 07/21/2014 1138   BILITOT 0.7 07/21/2014 1138   GFRNONAA >90 08/24/2012 0356   GFRAA >90 08/24/2012 0356   No results found for this basename: CHOL, HDL, LDLCALC, LDLDIRECT, TRIG, CHOLHDL   No results found for this basename: HGBA1C   No results found for this basename: VITAMINB12   No results found for this basename: TSH    MRI lumbar spine - mild disc bulging without foraminal stenosis (this result mentioned in referral notes; images and report not available).   ASSESSMENT AND PLAN  28 y.o. year old female here with low back pain radiating to right buttock, right posterior leg and right foot since 2011 (intermittently). MRI lumbar spine unremarkable. Exam notable for positive straight leg raise on right and positive FABER and FADIR on right.   Dx: LBP with right sciatica; ? Piriformis syndrome  PLAN: - PT evaluation   Orders Placed This Encounter  Procedures  . Ambulatory referral to Physical Therapy    Return in about 3 months (around 11/12/2014).    Suanne Marker, MD 08/13/2014, 11:54 AM Certified in Neurology, Neurophysiology and Neuroimaging  [facility] [address] [place], [place] [address] [phone]

## 2014-12-03 ENCOUNTER — Encounter (HOSPITAL_BASED_OUTPATIENT_CLINIC_OR_DEPARTMENT_OTHER): Payer: Self-pay | Admitting: *Deleted

## 2014-12-03 ENCOUNTER — Emergency Department (HOSPITAL_BASED_OUTPATIENT_CLINIC_OR_DEPARTMENT_OTHER)
Admission: EM | Admit: 2014-12-03 | Discharge: 2014-12-04 | Disposition: A | Discharge status: Home / Self Care | Payer: 59 | Attending: Emergency Medicine | Admitting: Emergency Medicine

## 2014-12-03 DIAGNOSIS — R011 Cardiac murmur, unspecified: Secondary | ICD-10-CM | POA: Insufficient documentation

## 2014-12-03 DIAGNOSIS — B349 Viral infection, unspecified: Secondary | ICD-10-CM | POA: Insufficient documentation

## 2014-12-03 DIAGNOSIS — Z3202 Encounter for pregnancy test, result negative: Secondary | ICD-10-CM | POA: Insufficient documentation

## 2014-12-03 NOTE — ED Notes (Signed)
C/o sore throat, also reports sob, chest heaviness & throat swelling. Onset 2d ago. Rates pain 9/10. (denies: cough, congestion, cold sx, fever, nvd or dizziness), mentions constipation. Last BM PTA. LS CTA. Throat red irritated and swollen, R>L. Alert, NAD, calm, interactive, no dyspnea noted.

## 2014-12-04 ENCOUNTER — Encounter (HOSPITAL_BASED_OUTPATIENT_CLINIC_OR_DEPARTMENT_OTHER): Payer: Self-pay | Admitting: Emergency Medicine

## 2014-12-04 LAB — PREGNANCY, URINE: Preg Test, Ur: NEGATIVE

## 2014-12-04 LAB — RAPID STREP SCREEN (MED CTR MEBANE ONLY): Streptococcus, Group A Screen (Direct): NEGATIVE

## 2014-12-04 MED ORDER — BENZONATATE 100 MG PO CAPS
200.0000 mg | ORAL_CAPSULE | Freq: Once | ORAL | Status: AC
  Administered 2014-12-04: 200 mg via ORAL
  Filled 2014-12-04: qty 2

## 2014-12-04 MED ORDER — NAPROXEN 375 MG PO TABS: 375.0000 mg | ORAL_TABLET | Freq: Two times a day (BID) | ORAL | Status: DC

## 2014-12-04 MED ORDER — LIDOCAINE VISCOUS 2 % MT SOLN
15.0000 mL | Freq: Once | OROMUCOSAL | Status: AC
  Administered 2014-12-04: 15 mL via OROMUCOSAL
  Filled 2014-12-04: qty 15

## 2014-12-04 MED ORDER — CETIRIZINE-PSEUDOEPHEDRINE ER 5-120 MG PO TB12
1.0000 | ORAL_TABLET | Freq: Every day | ORAL | Status: DC
Start: 2014-12-04 — End: 2016-01-31

## 2014-12-04 NOTE — ED Provider Notes (Signed)
CSN: [id number]     Arrival date & time 12/03/14  2316 History   First MD Initiated Contact with Patient 12/04/14 0015     Chief Complaint  Patient presents with  . Sore Throat     (Consider location/radiation/quality/duration/timing/severity/associated sxs/prior Treatment) Patient is a 29 y.o. female presenting with pharyngitis. The history is provided by the patient.  Sore Throat This is a new problem. The current episode started more than 2 days ago. The problem occurs constantly. The problem has not changed since onset.Pertinent negatives include no chest pain, no abdominal pain, no headaches and no shortness of breath. Nothing aggravates the symptoms. Nothing relieves the symptoms. She has tried nothing for the symptoms. The treatment provided no relief.    Past Medical History  Diagnosis Date  . Heart murmur   . [id number](478.2)    Past Surgical History  Procedure Laterality Date  . Bunionectomy  2005   Family History  Problem Relation Age of Onset  . Hypertension Mother   . Thyroid disease Mother   . Hypertension Maternal Grandmother    History  Substance Use Topics  . Smoking status: Never Smoker   . Smokeless tobacco: Never Used  . Alcohol Use: No   OB History    No data available     Review of Systems  Constitutional: Negative for fever.  HENT: Positive for congestion. Negative for drooling, ear discharge, trouble swallowing and voice change.   Respiratory: Negative for shortness of breath and wheezing.   Cardiovascular: Negative for chest pain, palpitations and leg swelling.  Gastrointestinal: Negative for abdominal pain.  Neurological: Negative for headaches.  All other systems reviewed and are negative.     Allergies  Review of patient's allergies indicates no known allergies.  Home Medications   Prior to Admission medications   Medication Sig Start Date End Date Taking? Authorizing Provider  cyclobenzaprine (FLEXERIL) 5 MG tablet Take 1 tablet (5  mg total) by mouth at bedtime as needed. 12/26/13   Thao P Le, DO  traMADol (ULTRAM) 50 MG tablet Take 50 mg by mouth every 6 (six) hours as needed.    Historical Provider, MD   BP 125/80 mmHg  Pulse 77  Temp(Src) 98.7 F (37.1 C) (Oral)  Resp 16  Wt 160 lb (72.576 kg)  SpO2 100%  LMP 11/16/2014 Physical Exam  Constitutional: She is oriented to person, place, and time. She appears well-developed and well-nourished. No distress.  HENT:  Head: Normocephalic and atraumatic.  Mouth/Throat: Oropharynx is clear and moist. No oropharyngeal exudate.  Eyes: Conjunctivae and EOM are normal. Pupils are equal, round, and reactive to light.  Neck: Normal range of motion. Neck supple. No tracheal deviation present.  No pain with displacement of the trachea.  Mallempati class 0  Cardiovascular: Normal rate, regular rhythm and intact distal pulses.   Pulmonary/Chest: Effort normal and breath sounds normal. No respiratory distress. She has no wheezes. She has no rales. She exhibits no tenderness.  Abdominal: Soft. Bowel sounds are normal. There is no tenderness. There is no rebound and no guarding.  Musculoskeletal: Normal range of motion.  Lymphadenopathy:    She has no cervical adenopathy.  Neurological: She is alert and oriented to person, place, and time.  Skin: Skin is warm and dry.  Psychiatric: She has a normal mood and affect.    ED Course  Procedures (including critical care time) Labs Review Labs Reviewed  RAPID STREP SCREEN  CULTURE, GROUP A STREP  PREGNANCY, URINE  Imaging Review No results found.   EKG Interpretation None      MDM   Final diagnoses:  None    Based on Centor criteria there are no indications for further testing.  Airway is intact clear lungs saturating 100% on RA.  Motrin for pain and mucinex DM follow up with your PMD.     Date: 12/04/2014  Rate: 61  Rhythm: normal sinus rhythm with arrhythmia  QRS Axis: normal  Intervals: normal  ST/T Wave  abnormalities: normal  Conduction Disutrbances: none  Narrative Interpretation: unremarkable        Basilio Meadow K Aviance Cooperwood-Rasch, MD 12/04/14 [phone]

## 2014-12-06 LAB — CULTURE, GROUP A STREP

## 2015-03-01 ENCOUNTER — Ambulatory Visit: Payer: 59

## 2015-03-15 ENCOUNTER — Ambulatory Visit: Payer: 59 | Attending: Family Medicine

## 2015-03-18 ENCOUNTER — Emergency Department (HOSPITAL_COMMUNITY): Payer: 59

## 2015-03-18 ENCOUNTER — Encounter (HOSPITAL_COMMUNITY): Payer: Self-pay

## 2015-03-18 ENCOUNTER — Emergency Department (HOSPITAL_COMMUNITY)
Admission: EM | Admit: 2015-03-18 | Discharge: 2015-03-18 | Disposition: A | Discharge status: Home / Self Care | Payer: 59 | Attending: Emergency Medicine | Admitting: Emergency Medicine

## 2015-03-18 DIAGNOSIS — Z79899 Other long term (current) drug therapy: Secondary | ICD-10-CM | POA: Insufficient documentation

## 2015-03-18 DIAGNOSIS — R002 Palpitations: Secondary | ICD-10-CM | POA: Insufficient documentation

## 2015-03-18 DIAGNOSIS — R011 Cardiac murmur, unspecified: Secondary | ICD-10-CM | POA: Insufficient documentation

## 2015-03-18 DIAGNOSIS — F419 Anxiety disorder, unspecified: Secondary | ICD-10-CM | POA: Diagnosis present

## 2015-03-18 DIAGNOSIS — Z791 Long term (current) use of non-steroidal anti-inflammatories (NSAID): Secondary | ICD-10-CM | POA: Diagnosis not present

## 2015-03-18 LAB — CBC WITH DIFFERENTIAL/PLATELET
BASOS ABS: 0 10*3/uL (ref 0.0–0.1)
BASOS PCT: 0 % (ref 0–1)
EOS ABS: 0 10*3/uL (ref 0.0–0.7)
Eosinophils Relative: 0 % (ref 0–5)
HCT: 34.7 % — ABNORMAL LOW (ref 36.0–46.0)
Hemoglobin: 11.6 g/dL — ABNORMAL LOW (ref 12.0–15.0)
Lymphocytes Relative: 28 % (ref 12–46)
Lymphs Abs: 1.8 10*3/uL (ref 0.7–4.0)
MCH: 29.1 pg (ref 26.0–34.0)
MCHC: 33.4 g/dL (ref 30.0–36.0)
MCV: 87.2 fL (ref 78.0–100.0)
Monocytes Absolute: 0.5 10*3/uL (ref 0.1–1.0)
Monocytes Relative: 9 % (ref 3–12)
NEUTROS PCT: 63 % (ref 43–77)
Neutro Abs: 4 10*3/uL (ref 1.7–7.7)
Platelets: 291 10*3/uL (ref 150–400)
RBC: 3.98 MIL/uL (ref 3.87–5.11)
RDW: 12.6 % (ref 11.5–15.5)
WBC: 6.3 10*3/uL (ref 4.0–10.5)

## 2015-03-18 LAB — BASIC METABOLIC PANEL
Anion gap: 6 (ref 5–15)
BUN: 11 mg/dL (ref 6–23)
CALCIUM: 8.8 mg/dL (ref 8.4–10.5)
CO2: 25 mmol/L (ref 19–32)
CREATININE: 0.69 mg/dL (ref 0.50–1.10)
Chloride: 103 mmol/L (ref 96–112)
GFR calc Af Amer: >90 mL/min (ref >90)
GFR calc non Af Amer: >90 mL/min (ref >90)
GLUCOSE: 102 mg/dL — AB (ref 70–99)
Potassium: 4 mmol/L (ref 3.5–5.1)
SODIUM: 134 mmol/L — AB (ref 135–145)

## 2015-03-18 IMAGING — CR DG CHEST 2V
2 series · 2 of 2 positions shown · non-contrast
Comparison: [DATE]

CLINICAL DATA: Anxiety, chest tightness.

EXAM:
CHEST  2 VIEW

[w chest pa]
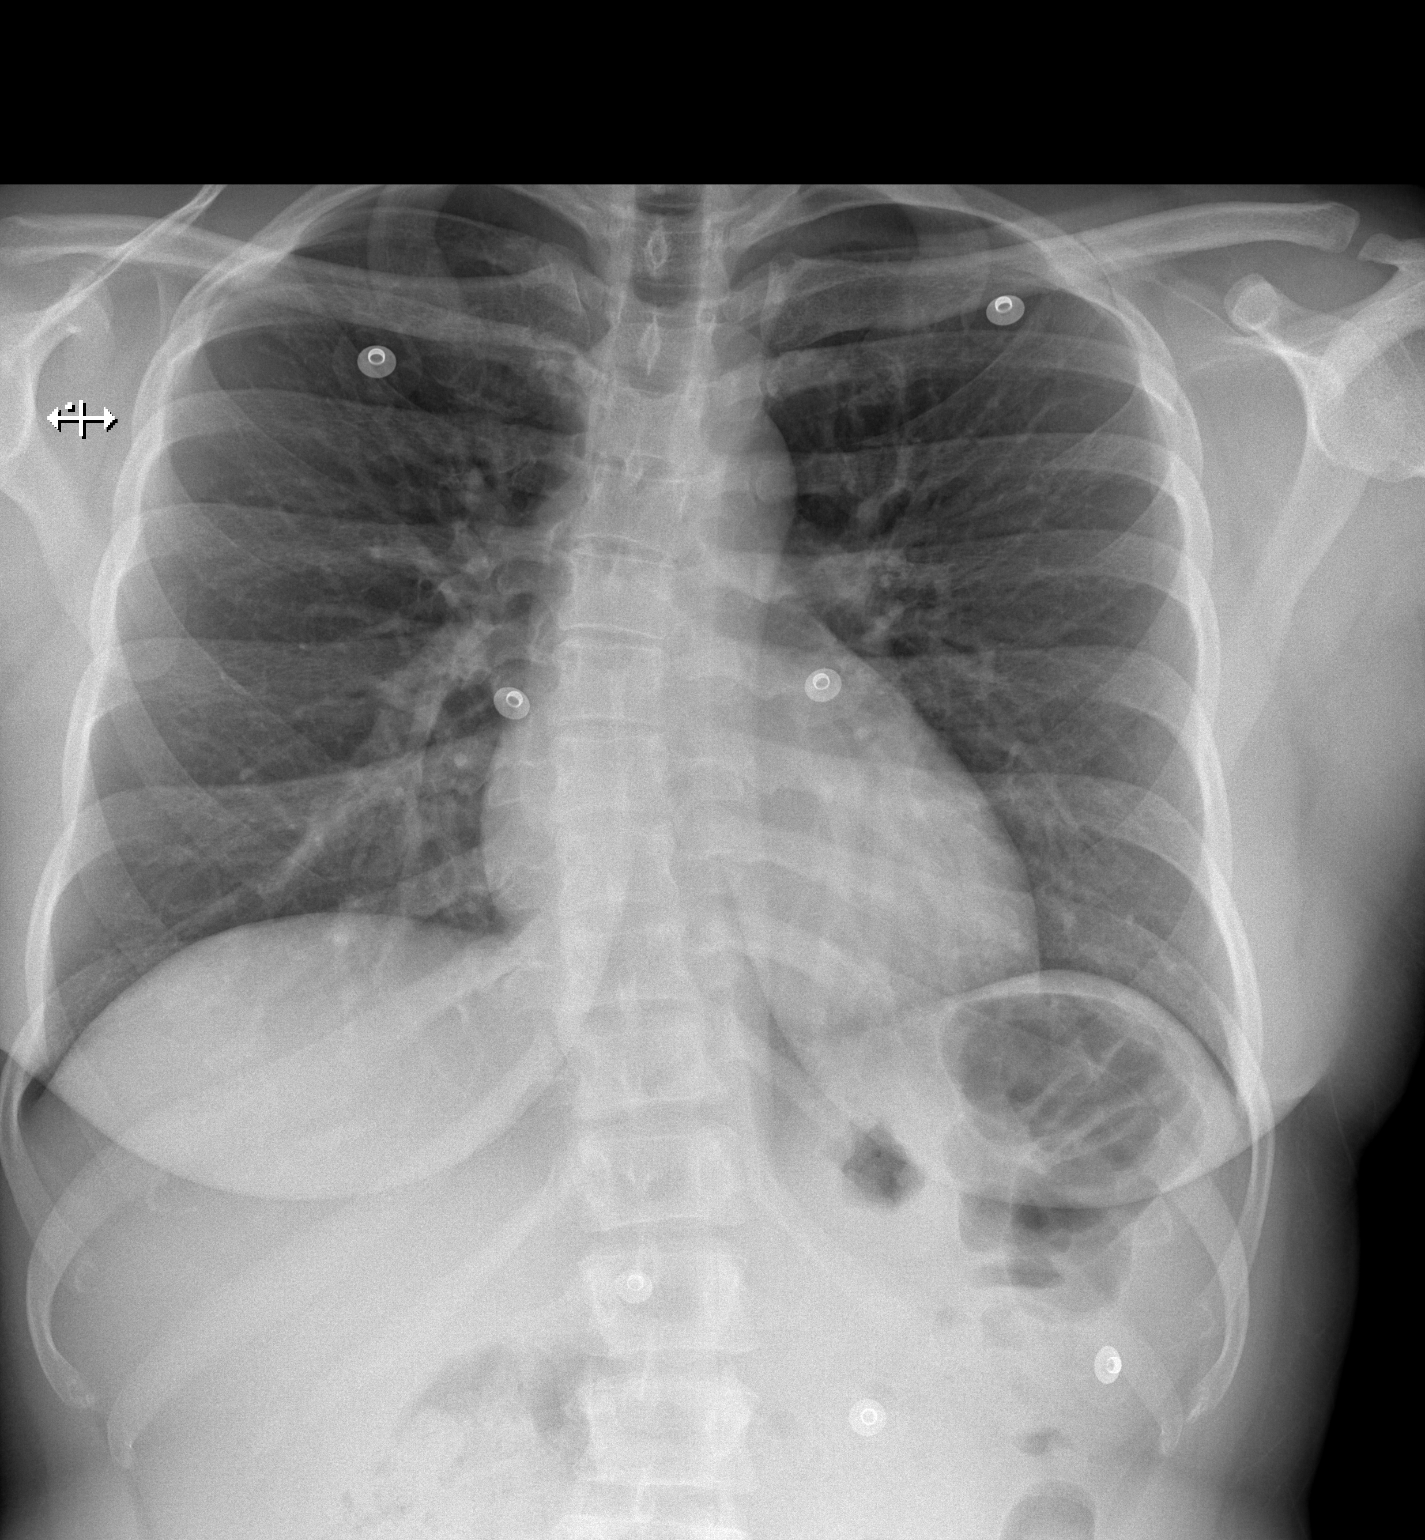

[w chest lat]
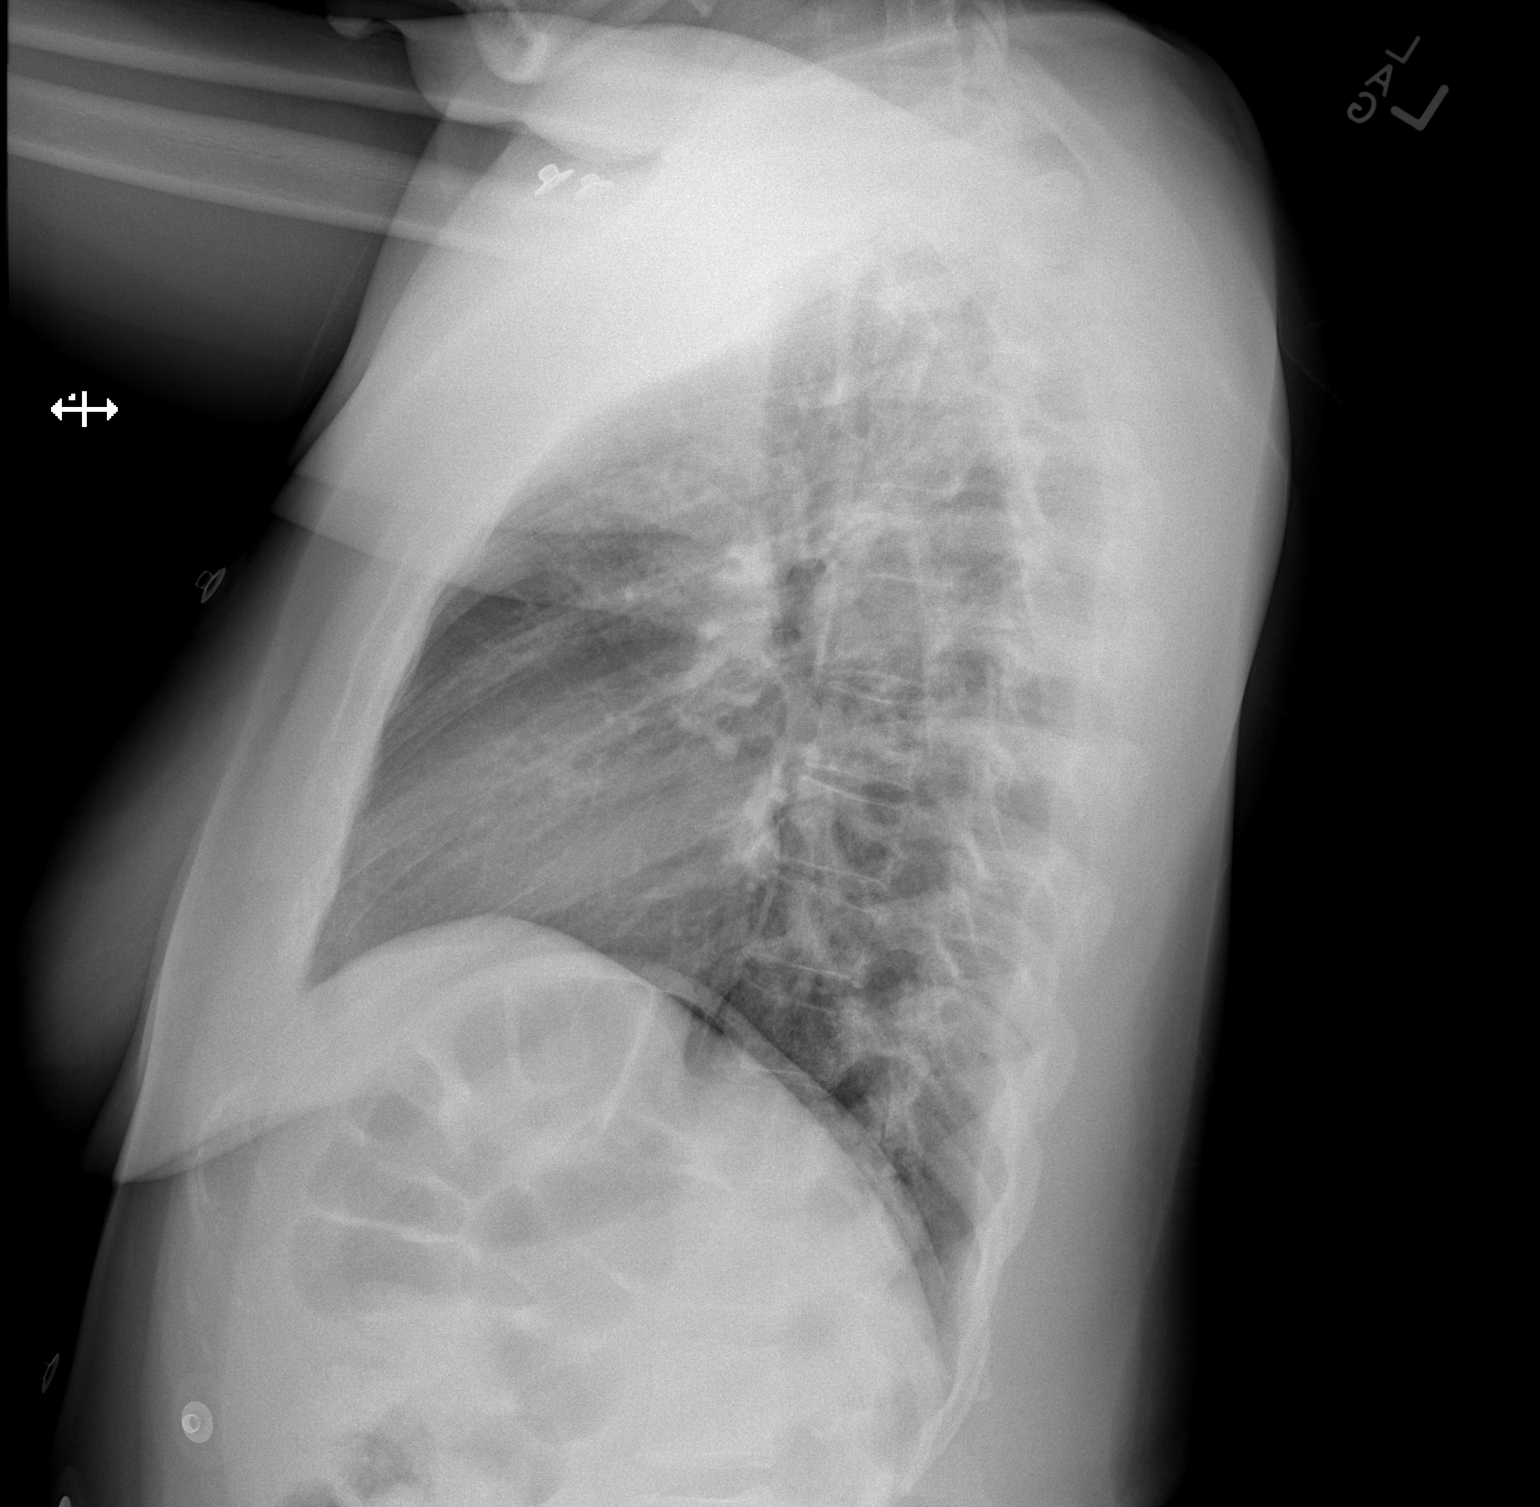

[2 of 2 positions shown; findings below may reference images not displayed]

FINDINGS: The cardiomediastinal contours are normal. The lungs are clear.
Pulmonary vasculature is normal. No consolidation, pleural effusion,
or pneumothorax. Mild broad-based dextroscoliotic curvature of the
thoracic spine. No acute osseous abnormalities are seen.
IMPRESSION: No acute pulmonary process.

## 2015-03-18 MED ORDER — LORAZEPAM 1 MG PO TABS: 1.0000 mg | ORAL_TABLET | Freq: Three times a day (TID) | ORAL | Status: DC | PRN

## 2015-03-18 NOTE — ED Provider Notes (Signed)
CSN: [id number]     Arrival date & time 03/18/15  0127 History   First MD Initiated Contact with Patient 03/18/15 0235     Chief Complaint  Patient presents with  . Anxiety     (Consider location/radiation/quality/duration/timing/severity/associated sxs/prior Treatment) HPI 29 year old female presents to emergency department with complaint of shortness of breath and palpitations waking from sleep.  Patient reports she had 3 separate episodes tonight while sleeping where she woke up with her heart pounding and short of breath.  Symptoms lasted about 15 minutes and then resolved.  Patient reports that she felt as though she was going to die.  She denies any medical problems, no current palpitations or shortness of breath.  She denies any leg swelling, smoking, exogenous hormones or prolonged immobilization to indicate PE.  She denies any excessive caffeine use, or over-the-counter medications.  She denies being anxious at baseline.  No prior history of same. Past Medical History  Diagnosis Date  . Heart murmur   . [id number](308.6)    Past Surgical History  Procedure Laterality Date  . Bunionectomy  2005   Family History  Problem Relation Age of Onset  . Hypertension Mother   . Thyroid disease Mother   . Hypertension Maternal Grandmother    History  Substance Use Topics  . Smoking status: Never Smoker   . Smokeless tobacco: Never Used  . Alcohol Use: No   OB History    No data available     Review of Systems  See History of Present Illness; otherwise all other systems are reviewed and negative   Allergies  Review of patient's allergies indicates no known allergies.  Home Medications   Prior to Admission medications   Medication Sig Start Date End Date Taking? Authorizing Provider  traMADol (ULTRAM) 50 MG tablet Take 50 mg by mouth every 6 (six) hours as needed.   Yes Historical Provider, MD  cetirizine-pseudoephedrine (ZYRTEC-D) 5-120 MG per tablet Take 1 tablet by mouth  daily. Patient not taking: Reported on 03/18/2015 12/04/14   April Palumbo, MD  cyclobenzaprine (FLEXERIL) 5 MG tablet Take 1 tablet (5 mg total) by mouth at bedtime as needed. Patient not taking: Reported on 03/18/2015 12/26/13   Thao P Le, DO  LORazepam (ATIVAN) 1 MG tablet Take 1 tablet (1 mg total) by mouth 3 (three) times daily as needed for anxiety. 03/18/15   Marisa Severin, MD  naproxen (NAPROSYN) 375 MG tablet Take 1 tablet (375 mg total) by mouth 2 (two) times daily. Patient not taking: Reported on 03/18/2015 12/04/14   April Palumbo, MD   BP 107/68 mmHg  Pulse 74  Temp(Src) 97.9 F (36.6 C) (Oral)  Resp 18  Ht  (1.549 m)  Wt 180 lb (81.647 kg)  BMI 34.03 kg/m2  SpO2 100%  LMP 03/18/2015 Physical Exam  Constitutional: She is oriented to person, place, and time. She appears well-developed and well-nourished.  HENT:  Head: Normocephalic and atraumatic.  Nose: Nose normal.  Mouth/Throat: Oropharynx is clear and moist.  Eyes: Conjunctivae and EOM are normal. Pupils are equal, round, and reactive to light.  Neck: Normal range of motion. Neck supple. No JVD present. No tracheal deviation present. No thyromegaly present.  Cardiovascular: Normal rate, regular rhythm, normal heart sounds and intact distal pulses.  Exam reveals no gallop and no friction rub.   No murmur heard. Pulmonary/Chest: Effort normal and breath sounds normal. No stridor. No respiratory distress. She has no wheezes. She has no rales. She exhibits no tenderness.  Abdominal: Soft. Bowel sounds are normal. She exhibits no distension and no mass. There is no tenderness. There is no rebound and no guarding.  Musculoskeletal: Normal range of motion. She exhibits no edema or tenderness.  Lymphadenopathy:    She has no cervical adenopathy.  Neurological: She is alert and oriented to person, place, and time. She displays normal reflexes. She exhibits normal muscle tone. Coordination normal.  Skin: Skin is warm and dry. No rash  noted. No erythema. No pallor.  Psychiatric: She has a normal mood and affect. Her behavior is normal. Judgment and thought content normal.  Nursing note and vitals reviewed.   ED Course  Procedures (including critical care time) Labs Review Labs Reviewed  CBC WITH DIFFERENTIAL/PLATELET - Abnormal; Notable for the following:    Hemoglobin 11.6 (*)    HCT 34.7 (*)    All other components within normal limits  BASIC METABOLIC PANEL - Abnormal; Notable for the following:    Sodium 134 (*)    Glucose, Bld 102 (*)    All other components within normal limits    Imaging Review Dg Chest 2 View  03/18/2015   CLINICAL DATA:  Anxiety, chest tightness.  EXAM: CHEST  2 VIEW  COMPARISON:  06/29/2004  FINDINGS: The cardiomediastinal contours are normal. The lungs are clear. Pulmonary vasculature is normal. No consolidation, pleural effusion, or pneumothorax. Mild broad-based dextroscoliotic curvature of the thoracic spine. No acute osseous abnormalities are seen.  IMPRESSION: No acute pulmonary process.   Electronically Signed   By: Rubye OaksMelanie  Ehinger M.D.   On: 03/18/2015 04:11     EKG Interpretation   Date/Time:  Friday March 18 2015 01:45:26 EDT Ventricular Rate:  63 PR Interval:  149 QRS Duration: 79 QT Interval:  385 QTC Calculation: 394 R Axis:   73 Text Interpretation:  Sinus rhythm Baseline wander in lead(s) I III aVL V1  Poor data quality Confirmed by Shayli Altemose  MD, [facility] ([id number]) on 03/18/2015  3:13:31 AM      MDM   Final diagnoses:  Palpitations    29 year old female with palpitations and dyspnea waking from sleep tonight on multiple occasions.  Although symptoms may be attributed to panic attack, I feel she will need further workup from her primary care doctor to rule out any arrhythmia that we are missing.  Patient instructed to follow-up with her primary care doctor if symptoms recur for Holter monitor or other further evaluation.  Marisa Severinlga Jayonna Meyering, MD 03/18/15 [id number]

## 2015-03-18 NOTE — ED Notes (Signed)
Pt states that she woke up three times tonight feeling short of breath and panicky, no hx of anxiety or panis attacks. 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 Pt states that her chest feels heavy

## 2015-03-18 NOTE — ED Notes (Signed)
Bed: [facility]4 Expected date:  Expected time:  Means of arrival:  Comments: EMS anxiety 
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
## 2015-03-18 NOTE — ED Notes (Signed)
Pt waiting on a ride home

## 2016-01-31 ENCOUNTER — Ambulatory Visit (INDEPENDENT_AMBULATORY_CARE_PROVIDER_SITE_OTHER): Payer: BLUE CROSS/BLUE SHIELD | Admitting: Certified Nurse Midwife

## 2016-01-31 ENCOUNTER — Encounter: Payer: Self-pay | Admitting: Certified Nurse Midwife

## 2016-01-31 VITALS — BP 110/72 | HR 70 | Resp 16 | Ht 61.25 in | Wt 165.0 lb

## 2016-01-31 DIAGNOSIS — Z0000 Encounter for general adult medical examination without abnormal findings: Secondary | ICD-10-CM

## 2016-01-31 DIAGNOSIS — Z30011 Encounter for initial prescription of contraceptive pills: Secondary | ICD-10-CM | POA: Diagnosis not present

## 2016-01-31 LAB — POCT URINALYSIS DIPSTICK
Bilirubin, UA: NEGATIVE
GLUCOSE UA: NEGATIVE
Ketones, UA: NEGATIVE
Leukocytes, UA: NEGATIVE
Nitrite, UA: NEGATIVE
Protein, UA: NEGATIVE
RBC UA: NEGATIVE
UROBILINOGEN UA: NEGATIVE
pH, UA: 5

## 2016-01-31 MED ORDER — NORETHIN ACE-ETH ESTRAD-FE 1-20 MG-MCG(24) PO TABS: 1.0000 | ORAL_TABLET | Freq: Every day | ORAL | Status: DC

## 2016-01-31 NOTE — Progress Notes (Signed)
Reviewed personally.  M. Suzanne Surena Welge, MD.  

## 2016-01-31 NOTE — Patient Instructions (Signed)
General topics  Next pap or exam is  due in 1 year Take a Women's multivitamin Take 1200 mg. of calcium daily - prefer dietary If any concerns in interim to call back  Breast Self-Awareness Practicing breast self-awareness may pick up problems early, prevent significant medical complications, and possibly save your life. By practicing breast self-awareness, you can become familiar with how your breasts look and feel and if your breasts are changing. This allows you to notice changes early. It can also offer you some reassurance that your breast health is good. One way to learn what is normal for your breasts and whether your breasts are changing is to do a breast self-exam. If you find a lump or something that was not present in the past, it is best to contact your caregiver right away. Other findings that should be evaluated by your caregiver include nipple discharge, especially if it is bloody; skin changes or reddening; areas where the skin seems to be pulled in (retracted); or new lumps and bumps. Breast pain is seldom associated with cancer (malignancy), but should also be evaluated by a caregiver. BREAST SELF-EXAM The best time to examine your breasts is 5 7 days after your menstrual period is over.  ExitCare Patient Information 2013 [employer].   Exercise to Stay Healthy Exercise helps you become and stay healthy. EXERCISE IDEAS AND TIPS Choose exercises that:  You enjoy.  Fit into your day. You do not need to exercise really hard to be healthy. You can do exercises at a slow or medium level and stay healthy. You can:  Stretch before and after working out.  Try yoga, Pilates, or tai chi.  Lift weights.  Walk fast, swim, jog, run, climb stairs, bicycle, dance, or rollerskate.  Take aerobic classes. Exercises that burn about 150 calories:  Running 1  miles in 15 minutes.  Playing volleyball for 45 to 60 minutes.  Washing and waxing a car for 45 to 60  minutes.  Playing touch football for 45 minutes.  Walking 1  miles in 35 minutes.  Pushing a stroller 1  miles in 30 minutes.  Playing basketball for 30 minutes.  Raking leaves for 30 minutes.  Bicycling 5 miles in 30 minutes.  Walking 2 miles in 30 minutes.  Dancing for 30 minutes.  Shoveling snow for 15 minutes.  Swimming laps for 20 minutes.  Walking up stairs for 15 minutes.  Bicycling 4 miles in 15 minutes.  Gardening for 30 to 45 minutes.  Jumping rope for 15 minutes.  Washing windows or floors for 45 to 60 minutes. Document Released: 12/22/2010 Document Revised: 02/11/2012 Document Reviewed: 12/22/2010 ExitCare Patient Information 2013 [employer].   Other topics ( that may be useful information):    Sexually Transmitted Disease Sexually transmitted disease (STD) refers to any infection that is passed from person to person during sexual activity. This may happen by way of saliva, semen, blood, vaginal mucus, or urine. Common STDs include:  Gonorrhea.  Chlamydia.  Syphilis.  HIV/AIDS.  Genital herpes.  Hepatitis B and C.  Trichomonas.  Human papillomavirus (HPV).  Pubic lice. CAUSES  An STD may be spread by bacteria, virus, or parasite. A person can get an STD by:  Sexual intercourse with an infected person.  Sharing sex toys with an infected person.  Sharing needles with an infected person.  Having intimate contact with the genitals, mouth, or rectal areas of an infected person. SYMPTOMS  Some people may not have any symptoms, but   they can still pass the infection to others. Different STDs have different symptoms. Symptoms include:  Painful or bloody urination.  Pain in the pelvis, abdomen, vagina, anus, throat, or eyes.  Skin rash, itching, irritation, growths, or sores (lesions). These usually occur in the genital or anal area.  Abnormal vaginal discharge.  Penile discharge in men.  Soft, flesh-colored skin growths in the  genital or anal area.  Fever.  Pain or bleeding during sexual intercourse.  Swollen glands in the groin area.  Yellow skin and eyes (jaundice). This is seen with hepatitis. DIAGNOSIS  To make a diagnosis, your caregiver may:  Take a medical history.  Perform a physical exam.  Take a specimen (culture) to be examined.  Examine a sample of discharge under a microscope.  Perform blood test TREATMENT   Chlamydia, gonorrhea, trichomonas, and syphilis can be cured with antibiotic medicine.  Genital herpes, hepatitis, and HIV can be treated, but not cured, with prescribed medicines. The medicines will lessen the symptoms.  Genital warts from HPV can be treated with medicine or by freezing, burning (electrocautery), or surgery. Warts may come back.  HPV is a virus and cannot be cured with medicine or surgery.However, abnormal areas may be followed very closely by your caregiver and may be removed from the cervix, vagina, or vulva through office procedures or surgery. If your diagnosis is confirmed, your recent sexual partners need treatment. This is true even if they are symptom-free or have a negative culture or evaluation. They should not have sex until their caregiver says it is okay. HOME CARE INSTRUCTIONS  All sexual partners should be informed, tested, and treated for all STDs.  Take your antibiotics as directed. Finish them even if you start to feel better.  Only take over-the-counter or prescription medicines for pain, discomfort, or fever as directed by your caregiver.  Rest.  Eat a balanced diet and drink enough fluids to keep your urine clear or pale yellow.  Do not have sex until treatment is completed and you have followed up with your caregiver. STDs should be checked after treatment.  Keep all follow-up appointments, Pap tests, and blood tests as directed by your caregiver.  Only use latex condoms and water-soluble lubricants during sexual activity. Do not use  petroleum jelly or oils.  Avoid alcohol and illegal drugs.  Get vaccinated for HPV and hepatitis. If you have not received these vaccines in the past, talk to your caregiver about whether one or both might be right for you.  Avoid risky sex practices that can break the skin. The only way to avoid getting an STD is to avoid all sexual activity.Latex condoms and dental dams (for oral sex) will help lessen the risk of getting an STD, but will not completely eliminate the risk. SEEK MEDICAL CARE IF:   You have a fever.  You have any new or worsening symptoms. Document Released: 02/09/2003 Document Revised: 02/11/2012 Document Reviewed: 02/16/2011 [facility] Patient Information 2013 [place].    Domestic Abuse You are being battered or abused if someone close to you hits, pushes, or physically hurts you in any way. You also are being abused if you are forced into activities. You are being sexually abused if you are forced to have sexual contact of any kind. You are being emotionally abused if you are made to feel worthless or if you are constantly threatened. It is important to remember that help is available. No one has the right to abuse you. PREVENTION OF FURTHER  ABUSE  Learn the warning signs of danger. This varies with situations but may include: the use of alcohol, threats, isolation from friends and family, or forced sexual contact. Leave if you feel that violence is going to occur.  If you are attacked or beaten, report it to the police so the abuse is documented. You do not have to press charges. The police can protect you while you or the attackers are leaving. Get the officer's name and badge number and a copy of the report.  Find someone you can trust and tell them what is happening to you: your caregiver, a nurse, clergy member, close friend or family member. Feeling ashamed is natural, but remember that you have done nothing wrong. No one deserves abuse. Document Released:  11/16/2000 Document Revised: 02/11/2012 Document Reviewed: 01/25/2011 ExitCare Patient Information 2013 [employer].    How Much is Too Much Alcohol? Drinking too much alcohol can cause injury, accidents, and health problems. These types of problems can include:   Car crashes.  Falls.  Family fighting (domestic violence).  Drowning.  Fights.  Injuries.  Burns.  Damage to certain organs.  Having a baby with birth defects. ONE DRINK CAN BE TOO MUCH WHEN YOU ARE:  Working.  Pregnant or breastfeeding.  Taking medicines. Ask your doctor.  Driving or planning to drive. If you or someone you know has a drinking problem, get help from a doctor.  Document Released: 09/15/2009 Document Revised: 02/11/2012 Document Reviewed: 09/15/2009 ExitCare Patient Information 2013 [employer].   Smoking Hazards Smoking cigarettes is extremely bad for your health. Tobacco smoke has over 200 known poisons in it. There are over 60 chemicals in tobacco smoke that cause cancer. Some of the chemicals found in cigarette smoke include:   Cyanide.  Benzene.  Formaldehyde.  Methanol (wood alcohol).  Acetylene (fuel used in welding torches).  Ammonia. Cigarette smoke also contains the poisonous gases nitrogen oxide and carbon monoxide.  Cigarette smokers have an increased risk of many serious medical problems and Smoking causes approximately:  90% of all lung cancer deaths in men.  80% of all lung cancer deaths in women.  90% of deaths from chronic obstructive lung disease. Compared with nonsmokers, smoking increases the risk of:  Coronary heart disease by 2 to 4 times.  Stroke by 2 to 4 times.  Men developing lung cancer by 23 times.  Women developing lung cancer by 13 times.  Dying from chronic obstructive lung diseases by 12 times.  . Smoking is the most preventable cause of death and disease in our society.  WHY IS SMOKING ADDICTIVE?  Nicotine is the chemical  agent in tobacco that is capable of causing addiction or dependence.  When you smoke and inhale, nicotine is absorbed rapidly into the bloodstream through your lungs. Nicotine absorbed through the lungs is capable of creating a powerful addiction. Both inhaled and non-inhaled nicotine may be addictive.  Addiction studies of cigarettes and spit tobacco show that addiction to nicotine occurs mainly during the teen years, when young people begin using tobacco products. WHAT ARE THE BENEFITS OF QUITTING?  There are many health benefits to quitting smoking.   Likelihood of developing cancer and heart disease decreases. Health improvements are seen almost immediately.  Blood pressure, pulse rate, and breathing patterns start returning to normal soon after quitting. QUITTING SMOKING   American Lung Association - 1-800-LUNGUSA  American Cancer Society - 1-800-ACS-2345 Document Released: 12/27/2004 Document Revised: 02/11/2012 Document Reviewed: 08/31/2009 ExitCare Patient Information 2013 ExitCare[employer]   LL[employer].   Stress Management Stress is a state of physical or mental tension that often results from changes in your life or normal routine. Some common causes of stress are:  Death of a loved one.  Injuries or severe illnesses.  Getting fired or changing jobs.  Moving into a new home. Other causes may be:  Sexual problems.  Business or financial losses.  Taking on a large debt.  Regular conflict with someone at home or at work.  Constant tiredness from lack of sleep. It is not just bad things that are stressful. It may be stressful to:  Win the lottery.  Get married.  Buy a new car. The amount of stress that can be easily tolerated varies from person to person. Changes generally cause stress, regardless of the types of change. Too much stress can affect your health. It may lead to physical or emotional problems. Too little stress (boredom) may also become stressful. SUGGESTIONS TO  REDUCE STRESS:  Talk things over with your family and friends. It often is helpful to share your concerns and worries. If you feel your problem is serious, you may want to get help from a professional counselor.  Consider your problems one at a time instead of lumping them all together. Trying to take care of everything at once may seem impossible. List all the things you need to do and then start with the most important one. Set a goal to accomplish 2 or 3 things each day. If you expect to do too many in a single day you will naturally fail, causing you to feel even more stressed.  Do not use alcohol or drugs to relieve stress. Although you may feel better for a short time, they do not remove the problems that caused the stress. They can also be habit forming.  Exercise regularly - at least 3 times per week. Physical exercise can help to relieve that "uptight" feeling and will relax you.  The shortest distance between despair and hope is often a good night's sleep.  Go to bed and get up on time allowing yourself time for appointments without being rushed.  Take a short "time-out" period from any stressful situation that occurs during the day. Close your eyes and take some deep breaths. Starting with the muscles in your face, tense them, hold it for a few seconds, then relax. Repeat this with the muscles in your neck, shoulders, hand, stomach, back and legs.  Take good care of yourself. Eat a balanced diet and get plenty of rest.  Schedule time for having fun. Take a break from your daily routine to relax. HOME CARE INSTRUCTIONS   Call if you feel overwhelmed by your problems and feel you can no longer manage them on your own.  Return immediately if you feel like hurting yourself or someone else. Document Released: 05/15/2001 Document Revised: 02/11/2012 Document Reviewed: 01/05/2008 [facility] Patient Information 2013 [place].  Oral Contraception Information Oral contraceptive  pills (OCPs) are medicines taken to prevent pregnancy. OCPs work by preventing the ovaries from releasing eggs. The hormones in OCPs also cause the cervical mucus to thicken, preventing the sperm from entering the uterus. The hormones also cause the uterine lining to become thin, not allowing a fertilized egg to attach to the inside of the uterus. OCPs are highly effective when taken exactly as prescribed. However, OCPs do not prevent sexually transmitted diseases (STDs). Safe sex practices, such as using condoms along with the pill, can help prevent STDs.  Before taking the pill, you may have a physical exam and Pap test. Your health care provider may order blood tests. The health care provider will make sure you are a good candidate for oral contraception. Discuss with your health care provider the possible side effects of the OCP you may be prescribed. When starting an OCP, it can take 2 to 3 months for the body to adjust to the changes in hormone levels in your body.  TYPES OF ORAL CONTRACEPTION  The combination pill--This pill contains estrogen and progestin (synthetic progesterone) hormones. The combination pill comes in 21-day, 28-day, or 91-day packs. Some types of combination pills are meant to be taken continuously (365-day pills). With 21-day packs, you do not take pills for 7 days after the last pill. With 28-day packs, the pill is taken every day. The last 7 pills are without hormones. Certain types of pills have more than 21 hormone-containing pills. With 91-day packs, the first 84 pills contain both hormones, and the last 7 pills contain no hormones or contain estrogen only.  The minipill--This pill contains the progesterone hormone only. The pill is taken every day continuously. It is very important to take the pill at the same time each day. The minipill comes in packs of 28 pills. All 28 pills contain the hormone.  ADVANTAGES OF ORAL CONTRACEPTIVE PILLS  Decreases premenstrual symptoms.    Treats menstrual period cramps.   Regulates the menstrual cycle.   Decreases a heavy menstrual flow.   May treatacne, depending on the type of pill.   Treats abnormal uterine bleeding.   Treats polycystic ovarian syndrome.   Treats endometriosis.   Can be used as emergency contraception.  THINGS THAT CAN MAKE ORAL CONTRACEPTIVE PILLS LESS EFFECTIVE OCPs can be less effective if:   You forget to take the pill at the same time every day.   You have a stomach or intestinal disease that lessens the absorption of the pill.   You take OCPs with other medicines that make OCPs less effective, such as antibiotics, certain HIV medicines, and some seizure medicines.   You take expired OCPs.   You forget to restart the pill on day 7, when using the packs of 21 pills.  RISKS ASSOCIATED WITH ORAL CONTRACEPTIVE PILLS  Oral contraceptive pills can sometimes cause side effects, such as:  Headache.  Nausea.  Breast tenderness.  Irregular bleeding or spotting. Combination pills are also associated with a small increased risk of:  Blood clots.  Heart attack.  Stroke.   This information is not intended to replace advice given to you by your health care provider. Make sure you discuss any questions you have with your health care provider.   Document Released: 02/09/2003 Document Revised: 09/09/2013 Document Reviewed: 05/10/2013 Elsevier Interactive Patient Education 2016 [place]. Oral Contraception Use Oral contraceptive pills (OCPs) are medicines taken to prevent pregnancy. OCPs work by preventing the ovaries from releasing eggs. The hormones in OCPs also cause the cervical mucus to thicken, preventing the sperm from entering the uterus. The hormones also cause the uterine lining to become thin, not allowing a fertilized egg to attach to the inside of the uterus. OCPs are highly effective when taken exactly as prescribed. However, OCPs do not prevent sexually  transmitted diseases (STDs). Safe sex practices, such as using condoms along with an OCP, can help prevent STDs. Before taking OCPs, you may have a physical exam and Pap test. Your health care provider may also order blood tests if necessary.  Your health care provider will make sure you are a good candidate for oral contraception. Discuss with your health care provider the possible side effects of the OCP you may be prescribed. When starting an OCP, it can take 2 to 3 months for the body to adjust to the changes in hormone levels in your body.  HOW TO TAKE ORAL CONTRACEPTIVE PILLS Your health care provider may advise you on how to start taking the first cycle of OCPs. Otherwise, you can:   Start on day 1 of your menstrual period. You will not need any backup contraceptive protection with this start time.   Start on the first Sunday after your menstrual period or the day you get your prescription. In these cases, you will need to use backup contraceptive protection for the first week.   Start the pill at any time of your cycle. If you take the pill within 5 days of the start of your period, you are protected against pregnancy right away. In this case, you will not need a backup form of birth control. If you start at any other time of your menstrual cycle, you will need to use another form of birth control for 7 days. If your OCP is the type called a minipill, it will protect you from pregnancy after taking it for 2 days (48 hours). After you have started taking OCPs:   If you forget to take 1 pill, take it as soon as you remember. Take the next pill at the regular time.   If you miss 2 or more pills, call your health care provider because different pills have different instructions for missed doses. Use backup birth control until your next menstrual period starts.   If you use a 28-day pack that contains inactive pills and you miss 1 of the last 7 pills (pills with no hormones), it will not  matter. Throw away the rest of the non-hormone pills and start a new pill pack.  No matter which day you start the OCP, you will always start a new pack on that same day of the week. Have an extra pack of OCPs and a backup contraceptive method available in case you miss some pills or lose your OCP pack.  HOME CARE INSTRUCTIONS   Do not smoke.   Always use a condom to protect against STDs. OCPs do not protect against STDs.   Use a calendar to mark your menstrual period days.   Read the information and directions that came with your OCP. Talk to your health care provider if you have questions.  SEEK MEDICAL CARE IF:   You develop nausea and vomiting.   You have abnormal vaginal discharge or bleeding.   You develop a rash.   You miss your menstrual period.   You are losing your hair.   You need treatment for mood swings or depression.   You get dizzy when taking the OCP.   You develop acne from taking the OCP.   You become pregnant.  SEEK IMMEDIATE MEDICAL CARE IF:   You develop chest pain.   You develop shortness of breath.   You have an uncontrolled or severe headache.   You develop numbness or slurred speech.   You develop visual problems.   You develop pain, redness, and swelling in the legs.    This information is not intended to replace advice given to you by your health care provider. Make sure you discuss any questions you have with your health care  provider.   Document Released: 11/08/2011 Document Revised: 12/10/2014 Document Reviewed: 05/10/2013 Elsevier Interactive Patient Education 2016 [employer]  

## 2016-01-31 NOTE — Progress Notes (Signed)
30 y.o. [id number] Single  African American Fe here to establish gyn care for contraception.Last  annual exam 1/17, all WNL with PCP( Dr. Piedad Climes). No pelvic exam done at last exam. Previous Depo Provera, and OCP no issues with except weight gain  Depo. Desires OCP for contraception and will be starting Accutane treatment for acne. History of chlamydia treated as a teenager. No other health issues today. Discussed will need exam today prior to Rx for OCP. Agreeable. Will sign for last pap smear, since not due.  Patient's last menstrual period was 01/14/2016.          Sexually active: No.  The current method of family planning is abstinence.    Exercising: Yes.    walking & workout Smoker:  no  Health Maintenance: Pap:  2015 neg per patient MMG:  none Colonoscopy:  none BMD:   none TDaP:  2011 Shingles: no Pneumonia: no Hep C and HIV: HIV yrs neg Labs: poct urine-neg Self breast exam: done occ   reports that she has never smoked. She has never used smokeless tobacco. She reports that she does not drink alcohol or use illicit drugs.  Past Medical History  Diagnosis Date  . Heart murmur   . Headache(784.0)   . Abnormal Pap smear of cervix     yrs ago?    Past Surgical History  Procedure Laterality Date  . Bunionectomy  2005  . Dilation and curettage of uterus      No current outpatient prescriptions on file.   No current facility-administered medications for this visit.    Family History  Problem Relation Age of Onset  . Hypertension Mother   . Thyroid disease Mother   . Hypertension Maternal Grandmother     ROS:  Pertinent items are noted in HPI.  Otherwise, a comprehensive ROS was negative.  Exam:   BP 110/72 mmHg  Pulse 70  Resp 16  Ht 5' 1.25" (1.556 m)  Wt 165 lb (74.844 kg)  BMI 30.91 kg/m2  LMP 01/14/2016 Height: 5' 1.25" (155.6 cm) Ht Readings from Last 3 Encounters:  01/31/16 5' 1.25" (1.556 m)  03/18/15  (1.549 m)  08/13/14  (1.575 m)     General appearance: alert, cooperative and appears stated age Head: Normocephalic, without obvious abnormality, atraumatic Neck: no adenopathy, supple, symmetrical, trachea midline and thyroid normal to inspection and palpation Lungs: clear to auscultation bilaterally Breasts: normal appearance, no masses or tenderness, No nipple retraction or dimpling, No nipple discharge or bleeding, No axillary or supraclavicular adenopathy Heart: regular rate and rhythm Abdomen: soft, non-tender; no masses,  no organomegaly Extremities: extremities normal, atraumatic, no cyanosis or edema Skin: Skin color, texture, turgor normal. No rashes or lesions Lymph nodes: Cervical, supraclavicular, and axillary nodes normal. No abnormal inguinal nodes palpated Neurologic: Grossly normal   Pelvic: External genitalia:  no lesions              Urethra:  normal appearing urethra with no masses, tenderness or lesions              Bartholin's and Skene's: normal                 Vagina: normal appearing vagina with normal color and discharge, no lesions              Cervix: normal,non tender,no lesions              Pap taken: No. Bimanual Exam:  Uterus:  normal size, contour, position,  consistency, mobility, non-tender              Adnexa: normal adnexa and no mass, fullness, tenderness               Rectovaginal: Confirms               Anus:  normal appearance  Chaperone present: yes  A:  Normal pelvic and exam  No contraindications for OCP use  History of abnormal pap as teen only, all normal per patient  History of Chlamydia as a teen treated  P:   Reviewed normal exam findings.  Discussed risks and benefits of OCP and warning signs. Discussed starting on first day of next period and no protection until on one month continuously. Questions addressed.  Rx Loestrin 24 Fe see order.  Reviewed pertinent items regarding exam and need for monthly SBE.  Return visit for OCP evaluation for continuance at 3  months. Obtain copy of pap smear.   Rv as above prn.

## 2016-02-19 ENCOUNTER — Ambulatory Visit (INDEPENDENT_AMBULATORY_CARE_PROVIDER_SITE_OTHER): Payer: BLUE CROSS/BLUE SHIELD | Admitting: Physician Assistant

## 2016-02-19 VITALS — BP 118/79 | HR 65 | Temp 98.1°F | Resp 20 | Ht 62.21 in | Wt 166.2 lb

## 2016-02-19 DIAGNOSIS — R002 Palpitations: Secondary | ICD-10-CM | POA: Insufficient documentation

## 2016-02-19 NOTE — Patient Instructions (Addendum)
I will call you with results of your lab tests. Continue lorazepam as needed. You will get a phone call to make appt with cardiology. Follow up with your PCP.    IF you received an x-ray today, you will receive an invoice from [facility]. Please contact [facility] at [phone] with questions or concerns regarding your invoice.   IF you received labwork today, you will receive an invoice from [facility]. Please contact [facility] at [phone] with questions or concerns regarding your invoice.   Our billing staff will not be able to assist you with questions regarding bills from these companies.  You will be contacted with the lab results as soon as they are available. The fastest way to get your results is to activate your My Chart account. Instructions are located on the last page of this paperwork. If you have not heard from us regarding the results in 2 weeks, please contact this office.

## 2016-02-19 NOTE — Progress Notes (Signed)
[facility] [address], [place] [address] [phone]  Date:  02/19/2016   Name:  [name] [name]   DOB:  [birth year]   MRN:  [id number]  PCP:  Burnis Medin, PA-C    Chief Complaint: Chest Pain   History of Present Illness:  This is a 30 y.o. female who is presenting with palpitations. First had this problem 1 year ago. Was seen in the ED - EKG and labs normal. Suspected to be d/t anxiety. Was told to follow up with PCP. PCP has put her on lorazepam prn symptoms. Pt states she has not noticed a benefit. Symptoms come and go. Symptoms were gone for about 1 month. Over the past week, symptoms are back and seem worse. Symptoms wake her up during the night. Waking her up 2-3 times a night. Heart is racing. This will last about 5-10 minutes and then resolves. Gets assoc mild chest pain, left arm numbness, sob. Denies dizziness, blurred vision. Over the past week only she has developed new daytime symptoms. Twice during the day states "feels like my heart drops". This will happen for a few seconds at a time. No assoc symptoms with this. She has never been referred to cardiology. She does states she is stressed with her job and being a single mom but she is no more stressed this week than in the past. No caffeine use. No new meds. No fam hx arrhythmias or heart disease.  Pt states no chance she could be pregnant. She is not sexually active currently. LMP 02/03/16.  Review of Systems:  Review of Systems See HPI  Patient Active Problem List   Diagnosis Date Noted  . Unspecified constipation 07/21/2014  . Headache(784.0) 11/16/2013    Prior to Admission medications   Medication Sig Start Date End Date Taking? Authorizing Provider  Norethindrone Acetate-Ethinyl Estrad-FE (LOESTRIN 24 FE) 1-20 MG-MCG(24) tablet Take 1 tablet by mouth daily. 01/31/16  Yes Verner Chol, CNM    No Known Allergies  Past Surgical History  Procedure Laterality Date  .  Bunionectomy  2005  . Dilation and curettage of uterus      Social History  Substance Use Topics  . Smoking status: Never Smoker   . Smokeless tobacco: Never Used  . Alcohol Use: No    Family History  Problem Relation Age of Onset  . Hypertension Mother   . Thyroid disease Mother   . Hypertension Maternal Grandmother     Medication list has been reviewed and updated.  Physical Examination:  Physical Exam  Constitutional: She is oriented to person, place, and time. She appears well-developed and well-nourished. No distress.  HENT:  Head: Normocephalic and atraumatic.  Right Ear: Hearing normal.  Left Ear: Hearing normal.  Nose: Nose normal.  Mouth/Throat: Uvula is midline, oropharynx is clear and moist and mucous membranes are normal.  Eyes: Conjunctivae, EOM and lids are normal. Pupils are equal, round, and reactive to light. Right eye exhibits no discharge. Left eye exhibits no discharge. No scleral icterus.  Neck: Trachea normal. Carotid bruit is not present. No thyromegaly present.  Cardiovascular: Normal rate, regular rhythm, normal heart sounds and normal pulses.   No murmur heard. Pulmonary/Chest: Effort normal and breath sounds normal. No respiratory distress. She has no wheezes. She has no rhonchi. She has no rales.  Musculoskeletal: Normal range of motion.  Lymphadenopathy:       Head (right side): No submental, no submandibular and no tonsillar adenopathy present.  Head (left side): No submental, no submandibular and no tonsillar adenopathy present.    She has no cervical adenopathy.  Neurological: She is alert and oriented to person, place, and time.  Skin: Skin is warm, dry and intact. No lesion and no rash noted.  Psychiatric: She has a normal mood and affect. Her speech is normal and behavior is normal. Thought content normal.   BP 118/79 mmHg  Pulse 65  Temp(Src) 98.1 F (36.7 C) (Oral)  Resp 20  Ht 5' 2.21" (1.58 m)  Wt 166 lb 3.2 oz (75.388 kg)   BMI 30.20 kg/m2  SpO2 98%  LMP 02/03/2016  EKG interpreted with Dr. Merla Richesoolittle: NSR  Assessment and Plan:  1. Palpitations EKG NSR. Labs pending. Possible that palps d/t stress, but need to rule out arrhythmia first. Referred to cardiology for further eval. In the meantime, continue lorazepam prn. Go to ED if symptoms worsen. Discussed return precautions. - EKG 12-Lead - Comprehensive metabolic panel - CBC - TSH - Ambulatory referral to Cardiology   Roswell MinersNicole V. Dyke BrackettBush, PA-C, MHS [facility]  02/19/2016

## 2016-02-20 LAB — COMPREHENSIVE METABOLIC PANEL
ALT: 16 U/L (ref 6–29)
AST: 16 U/L (ref 10–30)
Albumin: 4.3 g/dL (ref 3.6–5.1)
Alkaline Phosphatase: 57 U/L (ref 33–115)
BILIRUBIN TOTAL: 0.4 mg/dL (ref 0.2–1.2)
BUN: 12 mg/dL (ref 7–25)
CO2: 26 mmol/L (ref 20–31)
Calcium: 9.2 mg/dL (ref 8.6–10.2)
Chloride: 101 mmol/L (ref 98–110)
Creat: 0.76 mg/dL (ref 0.50–1.10)
GLUCOSE: 83 mg/dL (ref 65–99)
Potassium: 4.4 mmol/L (ref 3.5–5.3)
SODIUM: 137 mmol/L (ref 135–146)
Total Protein: 7 g/dL (ref 6.1–8.1)

## 2016-02-20 LAB — CBC
HCT: 35.9 % — ABNORMAL LOW (ref 36.0–46.0)
Hemoglobin: 12.2 g/dL (ref 12.0–15.0)
MCH: 29.5 pg (ref 26.0–34.0)
MCHC: 34 g/dL (ref 30.0–36.0)
MCV: 86.9 fL (ref 78.0–100.0)
MPV: 9.6 fL (ref 8.6–12.4)
PLATELETS: 311 10*3/uL (ref 150–400)
RBC: 4.13 MIL/uL (ref 3.87–5.11)
RDW: 12.9 % (ref 11.5–15.5)
WBC: 7.6 10*3/uL (ref 4.0–10.5)

## 2016-02-20 LAB — TSH: TSH: 0.49 m[IU]/L

## 2016-03-01 NOTE — Progress Notes (Signed)
     HPI: 30 year old female for evaluation of palpitations. 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 Patient has had chest pain for approximately one month. It occurs both with exertion and at rest. It does not radiate. Last 5-10 minutes and resolves. She also has occasional palpitations described as a skip and flutter. She has not had syncope. She denies dyspnea on exertion, orthopnea, PND or pedal edema.  Current Outpatient Prescriptions  Medication Sig Dispense Refill  . Norethindrone Acetate-Ethinyl Estrad-FE (LOESTRIN 24 FE) 1-20 MG-MCG(24) tablet Take 1 tablet by mouth daily. 1 Package 3   No current facility-administered medications for this visit.    No Known Allergies   Past Medical History  Diagnosis Date  . Heart murmur     no treatment for as child  . Headache(784.0)     tension only  . Abnormal Pap smear of cervix      teenager,  repeat pap smear    Past Surgical History  Procedure Laterality Date  . Bunionectomy  2005  . Dilation and curettage of uterus      Social History   Social History  . Marital Status: Single    Spouse Name: n/a  . Number of Children: 1  . Years of Education: 12th   Occupational History  . Lab Geophysicist/field seismologistAssistant     [employer]  . 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 LAB ASST [employer]   Social History Main Topics  . Smoking status: Never Smoker   . Smokeless tobacco: Never Used  . Alcohol Use: 0.0 oz/week    0 Standard drinks or equivalent per week     Comment: Rare  . Drug Use: No  . Sexual Activity: No   Other Topics Concern  . Not on file   Social History Narrative    Family History  Problem Relation Age of Onset  . Hypertension Mother   . Thyroid disease Mother   . Hypertension Maternal Grandmother     ROS: no fevers or chills, productive cough, hemoptysis, dysphasia, odynophagia, melena, hematochezia, dysuria,  hematuria, rash, seizure activity, orthopnea, PND, pedal edema, claudication. Remaining systems are negative.  Physical Exam:   Blood pressure 108/80, pulse 64, height 5' 1.5" (1.562 m), weight 166 lb 8 oz (75.524 kg), last menstrual period 02/03/2016.  General:  Well developed/well nourished in NAD Skin warm/dry Patient not depressed No peripheral clubbing Back-normal HEENT-normal/normal eyelids Neck supple/normal carotid upstroke bilaterally; no bruits; no JVD; no thyromegaly chest - CTA/ normal expansion CV - RRR/normal S1 and S2; no rubs or gallops;  PMI nondisplaced, 2/6 systolic murmur left sternal border. Abdomen -NT/ND, no HSM, no mass, + bowel sounds, no bruit 2+ femoral pulses, no bruits Ext-no edema, chords, 2+ DP Neuro-grossly nonfocal  ECG 02/19/2016-sinus rhythm with no ST changes.

## 2016-03-05 ENCOUNTER — Ambulatory Visit (INDEPENDENT_AMBULATORY_CARE_PROVIDER_SITE_OTHER): Payer: BLUE CROSS/BLUE SHIELD | Admitting: Cardiology

## 2016-03-05 ENCOUNTER — Encounter: Payer: Self-pay | Admitting: Cardiology

## 2016-03-05 VITALS — BP 108/80 | HR 64 | Ht 61.5 in | Wt 166.5 lb

## 2016-03-05 DIAGNOSIS — R011 Cardiac murmur, unspecified: Secondary | ICD-10-CM | POA: Diagnosis not present

## 2016-03-05 DIAGNOSIS — R079 Chest pain, unspecified: Secondary | ICD-10-CM | POA: Diagnosis not present

## 2016-03-05 DIAGNOSIS — R002 Palpitations: Secondary | ICD-10-CM

## 2016-03-05 NOTE — Assessment & Plan Note (Signed)
Probable ejection murmur. An echocardiogram to further assess.

## 2016-03-05 NOTE — Assessment & Plan Note (Signed)
Plan echocardiogram to reassess LV function. Note she has a soft murmur on examination as well. We can add beta-blockade if her symptoms worsen in the future

## 2016-03-05 NOTE — Patient Instructions (Signed)
Medication Instructions:   NO CHANGE  Testing/Procedures:  Your physician has requested that you have an echocardiogram. Echocardiography is a painless test that uses sound waves to create images of your heart. It provides your doctor with information about the size and shape of your heart and how well your heart's chambers and valves are working. This procedure takes approximately one hour. There are no restrictions for this procedure.   Your physician has recommended that you wear an event monitor. Event monitors are medical devices that record the heart's electrical activity. Doctors most often us these monitors to diagnose arrhythmias. Arrhythmias are problems with the speed or rhythm of the heartbeat. The monitor is a small, portable device. You can wear one while you do your normal daily activities. This is usually used to diagnose what is causing palpitations/syncope (passing out).   Your physician has requested that you have an exercise tolerance test. For further information please visit [email or web address]. Please also follow instruction sheet, as given.    Follow-Up:  Your physician recommends that you schedule a follow-up appointment in: 8 WEEKS WITH DR Jens SomRENSHAW  Exercise Stress Electrocardiogram An exercise stress electrocardiogram is a test to check how blood flows to your heart. It is done to find areas of poor blood flow. You will need to walk on a treadmill for this test. The electrocardiogram will record your heartbeat when you are at rest and when you are exercising. BEFORE THE PROCEDURE  Do not have drinks with caffeine or foods with caffeine for 24 hours before the test, or as told by your doctor. This includes coffee, tea (even decaf tea), sodas, chocolate, and cocoa.  Follow your doctor's instructions about eating and drinking before the test.  Ask your doctor what medicines you should or should not take before the test. Take your medicines with water unless told by  your doctor not to.  If you use an inhaler, bring it with you to the test.  Bring a snack to eat after the test.  Do not  smoke for 4 hours before the test.  Do not put lotions, powders, creams, or oils on your chest before the test.  Wear comfortable shoes and clothing. PROCEDURE  You will have patches put on your chest. Small areas of your chest may need to be shaved. Wires will be connected to the patches.  Your heart rate will be watched while you are resting and while you are exercising.  You will walk on the treadmill. The treadmill will slowly get faster to raise your heart rate.  The test will take about 1-2 hours. AFTER THE PROCEDURE  Your heart rate and blood pressure will be watched after the test.  You may return to your normal diet, activities, and medicines or as told by your doctor.   This information is not intended to replace advice given to you by your health care provider. Make sure you discuss any questions you have with your health care provider.   Document Released: 05/07/2008 Document Revised: 12/10/2014 Document Reviewed: 07/27/2013 Elsevier Interactive Patient Education [employer]2016 Elsevier Inc.

## 2016-03-05 NOTE — Assessment & Plan Note (Signed)
Symptoms atypical. Plan exercise treadmill for risk stratification. 

## 2016-03-21 ENCOUNTER — Ambulatory Visit (HOSPITAL_COMMUNITY): Payer: BLUE CROSS/BLUE SHIELD

## 2016-03-27 ENCOUNTER — Telehealth (HOSPITAL_COMMUNITY): Payer: Self-pay

## 2016-03-27 NOTE — Telephone Encounter (Signed)
Encounter complete. 

## 2016-03-29 ENCOUNTER — Inpatient Hospital Stay (HOSPITAL_COMMUNITY): Admission: RE | Admit: 2016-03-29 | Payer: BLUE CROSS/BLUE SHIELD | Source: Ambulatory Visit

## 2016-04-03 ENCOUNTER — Other Ambulatory Visit (HOSPITAL_COMMUNITY): Payer: BLUE CROSS/BLUE SHIELD

## 2016-04-09 ENCOUNTER — Ambulatory Visit (INDEPENDENT_AMBULATORY_CARE_PROVIDER_SITE_OTHER): Payer: BLUE CROSS/BLUE SHIELD

## 2016-04-09 DIAGNOSIS — R002 Palpitations: Secondary | ICD-10-CM

## 2016-04-26 ENCOUNTER — Ambulatory Visit: Payer: BLUE CROSS/BLUE SHIELD | Admitting: Cardiology

## 2016-05-03 ENCOUNTER — Telehealth: Payer: Self-pay | Admitting: Certified Nurse Midwife

## 2016-05-03 ENCOUNTER — Encounter: Payer: BLUE CROSS/BLUE SHIELD | Admitting: Certified Nurse Midwife

## 2016-05-03 ENCOUNTER — Encounter: Payer: Self-pay | Admitting: Certified Nurse Midwife

## 2016-05-03 NOTE — Progress Notes (Signed)
error 

## 2016-05-03 NOTE — Telephone Encounter (Signed)
Left message for patient to call and reschedule missed appointment from today.

## 2016-05-07 ENCOUNTER — Other Ambulatory Visit: Payer: Self-pay

## 2016-05-07 ENCOUNTER — Ambulatory Visit (HOSPITAL_COMMUNITY): Payer: BLUE CROSS/BLUE SHIELD | Attending: Cardiology

## 2016-05-07 DIAGNOSIS — R002 Palpitations: Secondary | ICD-10-CM | POA: Insufficient documentation

## 2016-05-07 LAB — ECHOCARDIOGRAM COMPLETE
AVLVOTPG: 8 mmHg
Ao-asc: 24 cm
CHL CUP DOP CALC LVOT VTI: 28.7 cm
CHL CUP MV DEC (S): 190
E/e' ratio: 6.58
EWDT: 190 ms
FS: 42 % (ref 28–44)
IV/PV OW: 0.92
LA ID, A-P, ES: 31 cm
LA diam end sys: 31 cm
LA diam index: 1.69 cm/m2
LA vol A4C: 38.4 ml
LA vol index: 19.5 mL/m2
LAVOL: 35.9 cm3
LDCA: 2.54 cm2
LV E/e' medial: 6.58
LV E/e'average: 6.58
LV TDI E'MEDIAL: 10.3
LV e' LATERAL: 15.5 cm/s
LVOT SV: 73 cm3
LVOTD: 18 cm
LVOTPV: 140 m/s
Lateral S' vel: 12.4 cm/s
MV Peak grad: 4 mmHg
MV pk E vel: 102 m/s
MVPKAVEL: 42.2 m/s
PW: 7.47 mm — AB (ref 0.6–1.1)
RV TAPSE: 21.6 cm
RV sys press: 26 mmHg
Reg peak vel: 214 cm/s
TDI e' lateral: 15.5
TR max vel: 214 m/s

## 2016-05-14 ENCOUNTER — Encounter: Payer: Self-pay | Admitting: *Deleted

## 2016-05-14 NOTE — Progress Notes (Signed)
Patient ID: [name] GilmoreKyla N [name], female   DOB: 06/28/1986, 30 y.o.   MRN: [id number] Patient did not show up for 05/14/16, 10:00 AM, appointment to have a GXT.

## 2016-06-13 NOTE — Progress Notes (Signed)
      HPI: FU palpitations. Monitor May 2017 showed sinus rhythm. Echocardiogram June 2017 showed normal LV function and no significant valvular disease. Since last seen,   Current Outpatient Prescriptions  Medication Sig Dispense Refill  . Norethindrone Acetate-Ethinyl Estrad-FE (LOESTRIN 24 FE) 1-20 MG-MCG(24) tablet Take 1 tablet by mouth daily. 1 Package 3   No current facility-administered medications for this visit.     Past Medical History  Diagnosis Date  . Heart murmur     no treatment for as child  . Headache(784.0)     tension only  . Abnormal Pap smear of cervix      teenager,  repeat pap smear    Past Surgical History  Procedure Laterality Date  . Bunionectomy  2005  . Dilation and curettage of uterus      Social History   Social History  . Marital Status: Single    Spouse Name: n/a  . Number of Children: 1  . Years of Education: 12th   Occupational History  . Lab Geophysicist/field seismologistAssistant     [employer]  . 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 LAB ASST [employer]   Social History Main Topics  . Smoking status: Never Smoker   . Smokeless tobacco: Never Used  . Alcohol Use: 0.0 oz/week    0 Standard drinks or equivalent per week     Comment: Rare  . Drug Use: No  . Sexual Activity: No   Other Topics Concern  . Not on file   Social History Narrative    Family History  Problem Relation Age of Onset  . Hypertension Mother   . Thyroid disease Mother   . Hypertension Maternal Grandmother     ROS: no fevers or chills, productive cough, hemoptysis, dysphasia, odynophagia, melena, hematochezia, dysuria, hematuria, rash, seizure activity, orthopnea, PND, pedal edema, claudication. Remaining systems are negative.  Physical Exam: Well-developed well-nourished in no acute distress.  Skin is warm and dry.  HEENT is normal.  Neck is supple.  Chest is clear to auscultation with normal expansion.  Cardiovascular exam is regular rate and  rhythm.  Abdominal exam nontender or distended. No masses palpated. Extremities show no edema. neuro grossly intact  ECG    This encounter was created in error - please disregard.

## 2016-06-18 ENCOUNTER — Encounter: Payer: BLUE CROSS/BLUE SHIELD | Admitting: Cardiology

## 2016-06-18 ENCOUNTER — Encounter: Payer: Self-pay | Admitting: *Deleted

## 2016-07-23 ENCOUNTER — Telehealth: Payer: Self-pay | Admitting: *Deleted

## 2016-07-23 NOTE — Telephone Encounter (Signed)
We have left this patient several message to call and reschedule her gxt.   (05/03/2016 we had to cancel pt appt due to being over booked. I have called pt left voicemail and i sent her email asking her to call out office. [email or web address]  03/29/16 Cancel Rsn: Patient (4/26 pt called to cancel she will call back to reschedule)  04/06/16 LMOM TO CALL AND SCHEDULE/D.Yamilet Mcfayden  07/02/16 LMOM TO CALL AND RESCHED/ RESCHEDULE/D.Lenya Sterne  06/27/16  UNABLE TO REACH,PHONE IS NOT TAKING CALL/D.[place] Mckesson?

## 2016-09-08 ENCOUNTER — Encounter (HOSPITAL_COMMUNITY): Payer: Self-pay | Admitting: Emergency Medicine

## 2016-09-08 ENCOUNTER — Emergency Department (HOSPITAL_COMMUNITY): Payer: 59

## 2016-09-08 ENCOUNTER — Emergency Department (HOSPITAL_COMMUNITY)
Admission: EM | Admit: 2016-09-08 | Discharge: 2016-09-08 | Disposition: A | Discharge status: Home / Self Care | Payer: 59 | Attending: Emergency Medicine | Admitting: Emergency Medicine

## 2016-09-08 DIAGNOSIS — Y999 Unspecified external cause status: Secondary | ICD-10-CM | POA: Insufficient documentation

## 2016-09-08 DIAGNOSIS — S199XXA Unspecified injury of neck, initial encounter: Secondary | ICD-10-CM | POA: Diagnosis present

## 2016-09-08 DIAGNOSIS — Y92410 Unspecified street and highway as the place of occurrence of the external cause: Secondary | ICD-10-CM | POA: Diagnosis not present

## 2016-09-08 DIAGNOSIS — S161XXA Strain of muscle, fascia and tendon at neck level, initial encounter: Secondary | ICD-10-CM | POA: Diagnosis not present

## 2016-09-08 DIAGNOSIS — Y939 Activity, unspecified: Secondary | ICD-10-CM | POA: Insufficient documentation

## 2016-09-08 DIAGNOSIS — T148XXA Other injury of unspecified body region, initial encounter: Secondary | ICD-10-CM
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
## 2016-09-08 MED ORDER — METHOCARBAMOL 500 MG PO TABS: 500.0000 mg | ORAL_TABLET | Freq: Two times a day (BID) | ORAL | 0 refills | Status: DC | PRN

## 2016-09-08 MED ORDER — METHOCARBAMOL 500 MG PO TABS
500.0000 mg | ORAL_TABLET | Freq: Once | ORAL | Status: AC
  Administered 2016-09-08: 500 mg via ORAL
  Filled 2016-09-08: qty 1

## 2016-09-08 MED ORDER — NAPROXEN 250 MG PO TABS
500.0000 mg | ORAL_TABLET | Freq: Once | ORAL | Status: AC
  Administered 2016-09-08: 500 mg via ORAL
  Filled 2016-09-08: qty 2

## 2016-09-08 MED ORDER — NAPROXEN 500 MG PO TABS: 500.0000 mg | ORAL_TABLET | Freq: Two times a day (BID) | ORAL | 0 refills | Status: DC | PRN

## 2016-09-08 NOTE — ED Notes (Signed)
PA-C at bedside 
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
## 2016-09-08 NOTE — ED Notes (Signed)
Patient verbalized understanding of discharge instructions and denies any further needs or questions at this time. VS stable. Patient ambulatory with steady gait.  
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
## 2016-09-08 NOTE — ED Notes (Signed)
Pt transported to xray 

## 2016-09-11 ENCOUNTER — Encounter: Payer: Self-pay | Admitting: Certified Nurse Midwife

## 2017-03-25 ENCOUNTER — Emergency Department (HOSPITAL_COMMUNITY)
Admission: EM | Admit: 2017-03-25 | Discharge: 2017-03-25 | Disposition: A | Discharge status: Home / Self Care | Payer: 59 | Attending: Emergency Medicine | Admitting: Emergency Medicine

## 2017-03-25 ENCOUNTER — Emergency Department (HOSPITAL_COMMUNITY): Payer: 59

## 2017-03-25 DIAGNOSIS — Y939 Activity, unspecified: Secondary | ICD-10-CM | POA: Diagnosis not present

## 2017-03-25 DIAGNOSIS — Z79899 Other long term (current) drug therapy: Secondary | ICD-10-CM | POA: Diagnosis not present

## 2017-03-25 DIAGNOSIS — M25571 Pain in right ankle and joints of right foot: Secondary | ICD-10-CM

## 2017-03-25 DIAGNOSIS — S99911A Unspecified injury of right ankle, initial encounter: Secondary | ICD-10-CM | POA: Insufficient documentation

## 2017-03-25 DIAGNOSIS — Y929 Unspecified place or not applicable: Secondary | ICD-10-CM | POA: Diagnosis not present

## 2017-03-25 DIAGNOSIS — W109XXA Fall (on) (from) unspecified stairs and steps, initial encounter: Secondary | ICD-10-CM | POA: Diagnosis not present

## 2017-03-25 DIAGNOSIS — Y999 Unspecified external cause status: Secondary | ICD-10-CM | POA: Insufficient documentation

## 2017-03-25 IMAGING — CR DG ANKLE COMPLETE 3+V*R*
3 series · 3 of 3 positions shown · non-contrast
Comparison: Right ankle radiograph dated [DATE]

CLINICAL DATA: 30-year-old female with fall and right ankle pain.

EXAM:
RIGHT ANKLE - COMPLETE 3+ VIEW

[x ankle ap right]
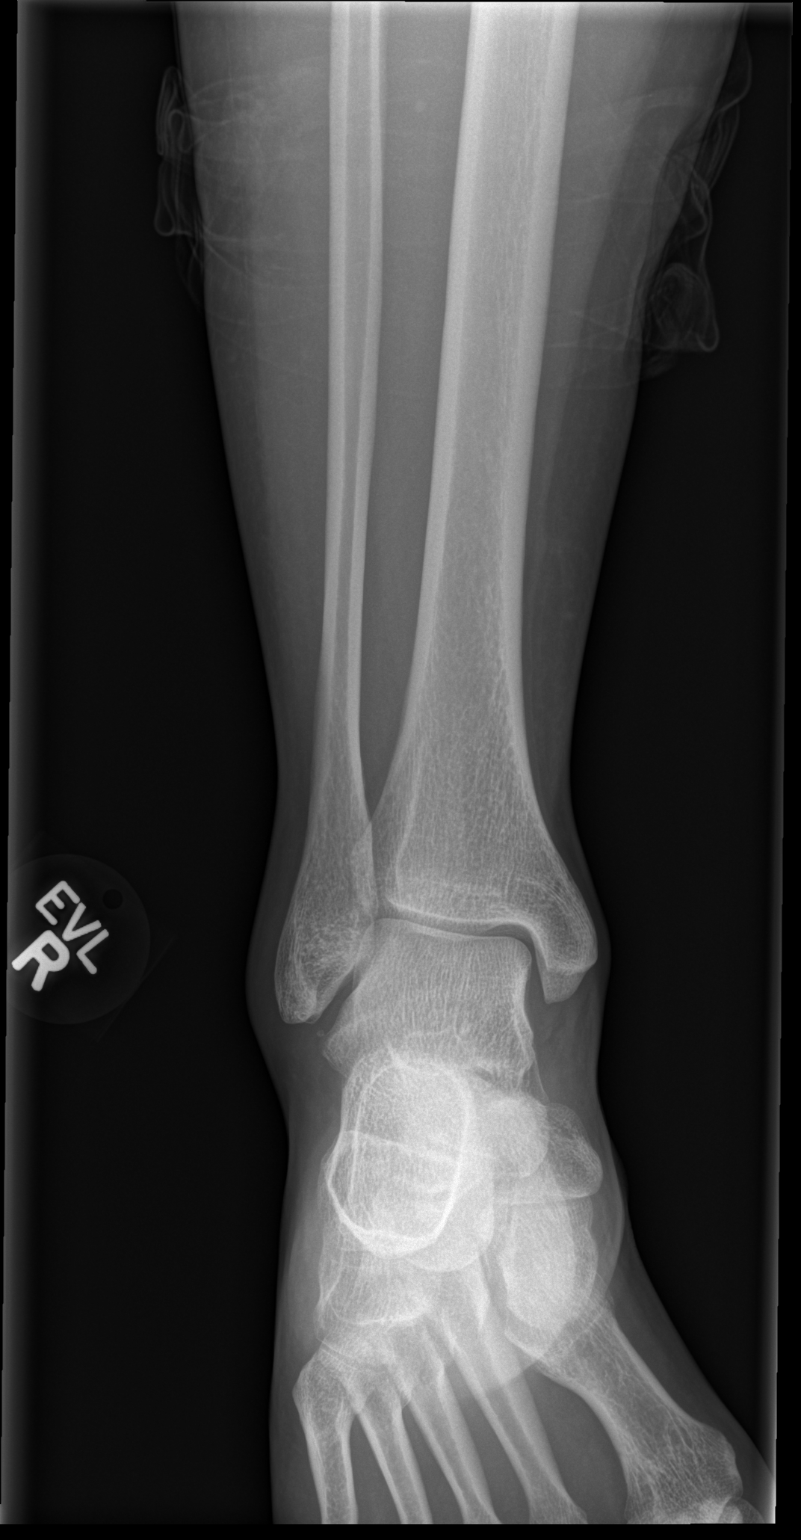

[x ankle obl right]
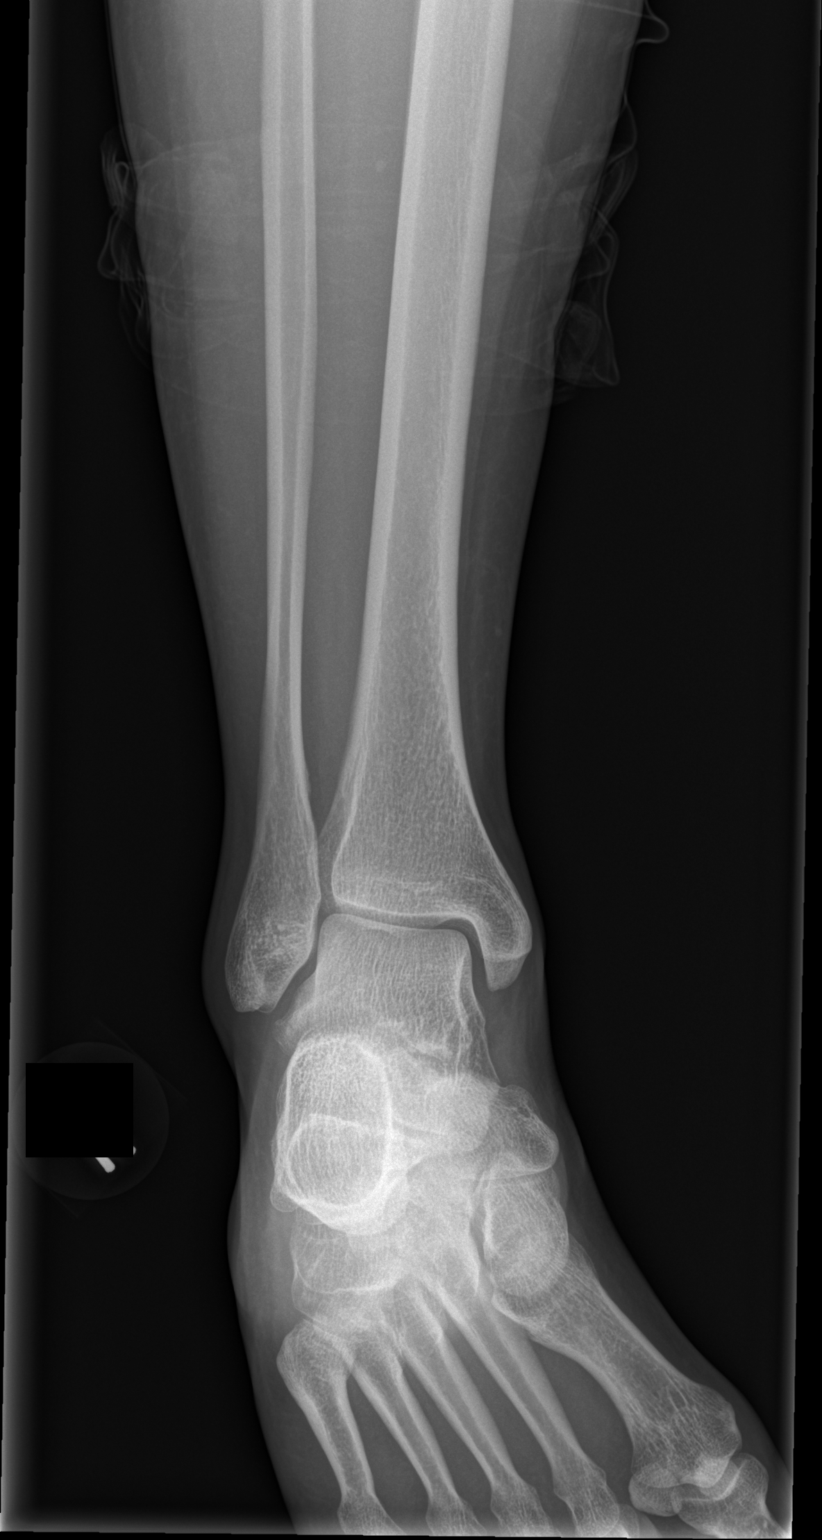

[x ankle lat right]
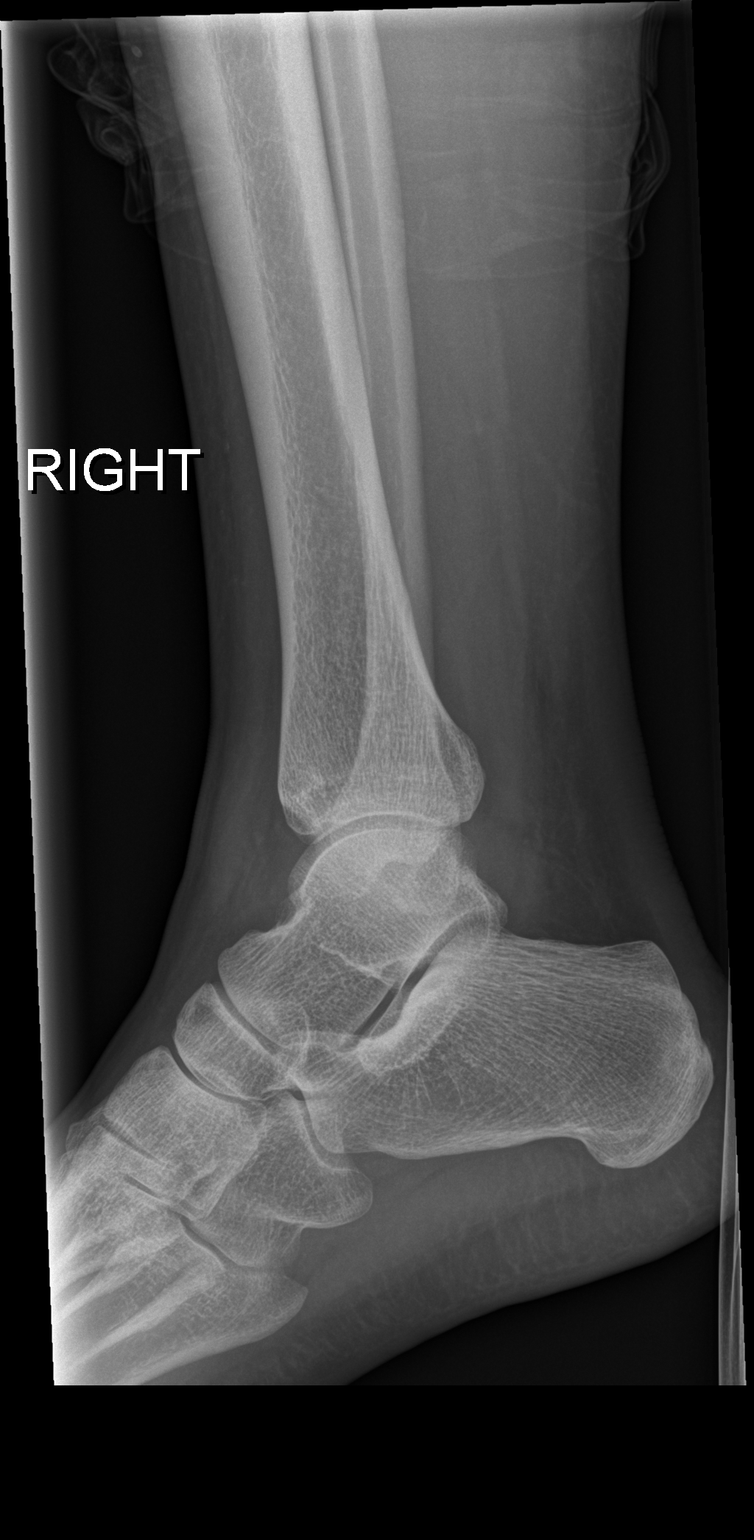

[3 of 3 positions shown; findings below may reference images not displayed]

FINDINGS: There is no evidence of fracture, dislocation, or joint effusion.
There is no evidence of arthropathy or other focal bone abnormality.
Soft tissues are unremarkable.
IMPRESSION: Negative.

## 2017-03-25 MED ORDER — HYDROCODONE-ACETAMINOPHEN 5-325 MG PO TABS: 1.0000 | ORAL_TABLET | Freq: Four times a day (QID) | ORAL | 0 refills | Status: DC | PRN

## 2017-03-25 MED ORDER — OXYCODONE-ACETAMINOPHEN 5-325 MG PO TABS
1.0000 | ORAL_TABLET | Freq: Once | ORAL | Status: AC
  Administered 2017-03-25: 1 via ORAL
  Filled 2017-03-25: qty 1

## 2017-03-25 NOTE — ED Notes (Signed)
Pt ambulatory and independent at discharge.  Verbalized understanding of discharge instructions 

## 2017-03-25 NOTE — ED Notes (Signed)
Ortho tech at bedside 

## 2017-03-25 NOTE — ED Triage Notes (Signed)
Pt rolled ankle inward when she fell down the stairs today, denies head trauma or pain anywhere else. Pain and swelling to right ankle.

## 2017-03-25 NOTE — ED Provider Notes (Signed)
[facility]-EMERGENCY DEPT Provider Note   CSN: [id number] Arrival date & time: 03/25/17  2153     History   Chief Complaint Chief Complaint  Patient presents with  . Ankle Injury    HPI [name] [name] is a 31 y.o. female.  Patient states she slipped on the stairs, rolling her right ankle inward. She did not hit her head or suffer any other injury.   The history is provided by the patient. No language interpreter was used.  Extremity Pain  This is a new problem. The current episode started 1 to 2 hours ago. The problem has not changed since onset.The symptoms are aggravated by standing. Nothing relieves the symptoms. She has tried rest and a cold compress for the symptoms. The treatment provided no relief.    Past Medical History:  Diagnosis Date  . Abnormal Pap smear of cervix     teenager,  repeat pap smear  . Headache(784.0)    tension only  . Heart murmur    no treatment for as child    Patient Active Problem List   Diagnosis Date Noted  . Chest pain 03/05/2016  . Murmur 03/05/2016  . Palpitations 02/19/2016  . Unspecified constipation 07/21/2014  . Headache(784.0) 11/16/2013    Past Surgical History:  Procedure Laterality Date  . BUNIONECTOMY  2005  . DILATION AND CURETTAGE OF UTERUS      OB History    Gravida Para Term Preterm AB Living   SAB TAB Ectopic Multiple Live Births   1               Home Medications    Prior to Admission medications   Medication Sig Start Date End Date Taking? Authorizing Provider  methocarbamol (ROBAXIN) 500 MG tablet Take 1 tablet (500 mg total) by mouth 2 (two) times daily as needed for muscle spasms. 09/08/16   Jaime Pilcher Ward, PA-C  naproxen (NAPROSYN) 500 MG tablet Take 1 tablet (500 mg total) by mouth 2 (two) times daily as needed for moderate pain. 09/08/16   Chase Picket Ward, PA-C  Norethindrone Acetate-Ethinyl Estrad-FE (LOESTRIN 24 FE) 1-20 MG-MCG(24) tablet Take 1 tablet by mouth daily. 01/31/16    Verner Chol, CNM    Family History Family History  Problem Relation Age of Onset  . Hypertension Mother   . Thyroid disease Mother   . Hypertension Maternal Grandmother     Social History Social History  Substance Use Topics  . Smoking status: Never Smoker  . Smokeless tobacco: Never Used  . Alcohol use 0.0 oz/week     Comment: Rare     Allergies   Patient has no known allergies.   Review of Systems Review of Systems  Musculoskeletal: Positive for arthralgias.  All other systems reviewed and are negative.    Physical Exam Updated Vital Signs BP (!) 119/97 (BP Location: Left Arm)   Pulse 82   Temp 98.3 F (36.8 C) (Oral)   Resp 18   SpO2 100%   Physical Exam  Constitutional: She is oriented to person, place, and time. She appears well-developed and well-nourished.  HENT:  Head: Normocephalic.  Eyes: Conjunctivae are normal.  Neck: Neck supple.  Cardiovascular: Normal rate and regular rhythm.   Pulmonary/Chest: Effort normal and breath sounds normal.  Abdominal: Soft. Bowel sounds are normal.  Musculoskeletal: She exhibits edema and tenderness. She exhibits no deformity.  Neurological: She is alert and oriented to person,  place, and time.  Skin: Skin is warm and dry. Capillary refill takes less than 2 seconds.  Psychiatric: She has a normal mood and affect.  Nursing note and vitals reviewed.    ED Treatments / Results  Labs (all labs ordered are listed, but only abnormal results are displayed) Labs Reviewed - No data to display  EKG  EKG Interpretation None       Radiology Dg Ankle Complete Right  Result Date: 03/25/2017 CLINICAL DATA:  31 year old female with fall and right ankle pain. EXAM: RIGHT ANKLE - COMPLETE 3+ VIEW COMPARISON:  Right ankle radiograph dated 02/22/2009 FINDINGS: There is no evidence of fracture, dislocation, or joint effusion. There is no evidence of arthropathy or other focal bone abnormality. Soft tissues are  unremarkable. IMPRESSION: Negative. Electronically Signed   By: Elgie Collard M.D.   On: 03/25/2017 23:06    Procedures Procedures (including critical care time)  Medications Ordered in ED Medications - No data to display   Initial Impression / Assessment and Plan / ED Course  I have reviewed the triage vital signs and the nursing notes.  Pertinent labs & imaging results that were available during my care of the patient were reviewed by me and considered in my medical decision making (see chart for details).     Patient X-Ray negative for obvious fracture or dislocation.  Pt advised to follow up with orthopedics. Patient given ankle splint and crutches while in ED, conservative therapy recommended and discussed. Patient will be discharged home & is agreeable with above plan. Returns precautions discussed. Pt appears safe for discharge.  Final Clinical Impressions(s) / ED Diagnoses   Final diagnoses:  Acute right ankle pain    New Prescriptions Discharge Medication List as of 03/25/2017 11:35 PM    START taking these medications   Details  HYDROcodone-acetaminophen (NORCO/VICODIN) 5-325 MG tablet Take 1 tablet by mouth every 6 (six) hours as needed for severe pain., Starting Mon 03/25/2017, Print         Felicie Morn, NP 03/26/17 1610    Nira Conn, MD 03/26/17 [phone]

## 2017-07-08 DIAGNOSIS — F4322 Adjustment disorder with anxiety: Secondary | ICD-10-CM | POA: Insufficient documentation

## 2018-02-18 ENCOUNTER — Emergency Department (HOSPITAL_COMMUNITY): Payer: 59

## 2018-02-18 ENCOUNTER — Other Ambulatory Visit: Payer: Self-pay

## 2018-02-18 ENCOUNTER — Emergency Department (HOSPITAL_COMMUNITY)
Admission: EM | Admit: 2018-02-18 | Discharge: 2018-02-18 | Disposition: A | Discharge status: Home / Self Care | Payer: 59 | Attending: Emergency Medicine | Admitting: Emergency Medicine

## 2018-02-18 ENCOUNTER — Encounter (HOSPITAL_COMMUNITY): Payer: Self-pay

## 2018-02-18 DIAGNOSIS — T148XXA Other injury of unspecified body region, initial encounter: Secondary | ICD-10-CM

## 2018-02-18 DIAGNOSIS — Z79899 Other long term (current) drug therapy: Secondary | ICD-10-CM | POA: Diagnosis not present

## 2018-02-18 DIAGNOSIS — W108XXA Fall (on) (from) other stairs and steps, initial encounter: Secondary | ICD-10-CM | POA: Insufficient documentation

## 2018-02-18 DIAGNOSIS — Y999 Unspecified external cause status: Secondary | ICD-10-CM | POA: Insufficient documentation

## 2018-02-18 DIAGNOSIS — Y9301 Activity, walking, marching and hiking: Secondary | ICD-10-CM | POA: Diagnosis not present

## 2018-02-18 DIAGNOSIS — S0990XA Unspecified injury of head, initial encounter: Secondary | ICD-10-CM

## 2018-02-18 DIAGNOSIS — Y929 Unspecified place or not applicable: Secondary | ICD-10-CM | POA: Insufficient documentation

## 2018-02-18 DIAGNOSIS — S161XXA Strain of muscle, fascia and tendon at neck level, initial encounter: Secondary | ICD-10-CM | POA: Diagnosis not present

## 2018-02-18 IMAGING — CT CT CERVICAL SPINE W/O CM
4 of 8 series · 12 of 33 positions shown, 13 images · non-contrast
Comparison: None.

CLINICAL DATA: Status post falling and slipping, hitting the back
of the head. Patient complains of pain of the neck and head.

EXAM:
CT HEAD WITHOUT CONTRAST
CT CERVICAL SPINE WITHOUT CONTRAST
TECHNIQUE: Multidetector CT imaging of the head and cervical spine was
performed following the standard protocol without intravenous
contrast. Multiplanar CT image reconstructions of the cervical spine
were also generated.

[Series 4: coronal soft tissue · coronal · 0.34mm/px · 3 of 60 slices shown]
[im 15/60  bone]
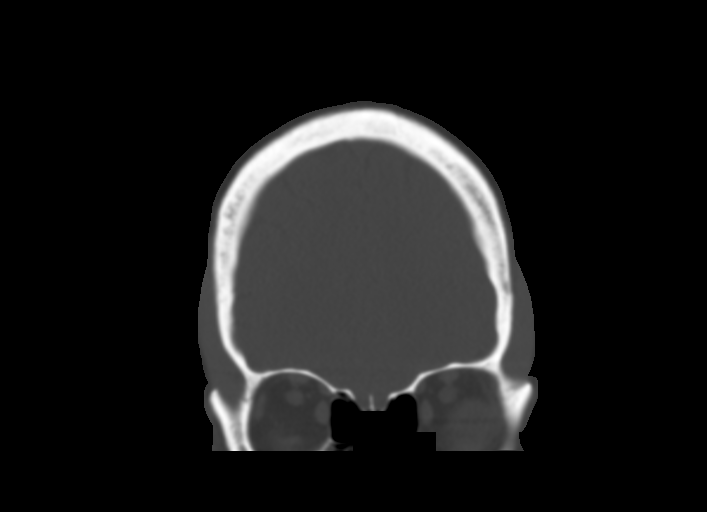
[im 30/60  bone]
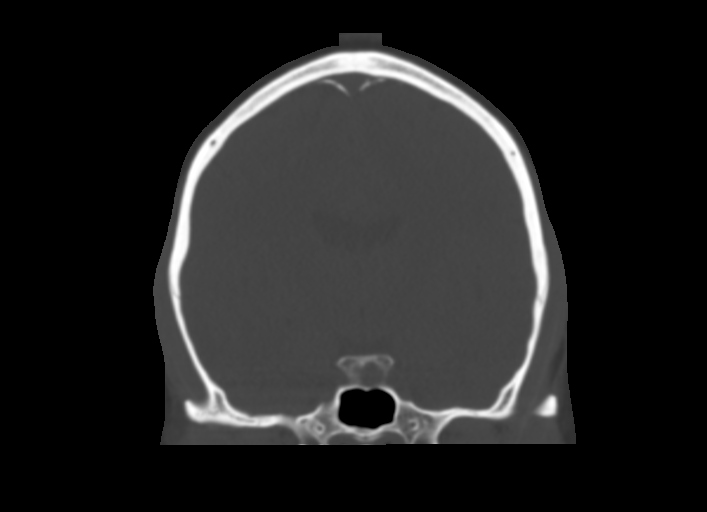
[im 45/60  bone]
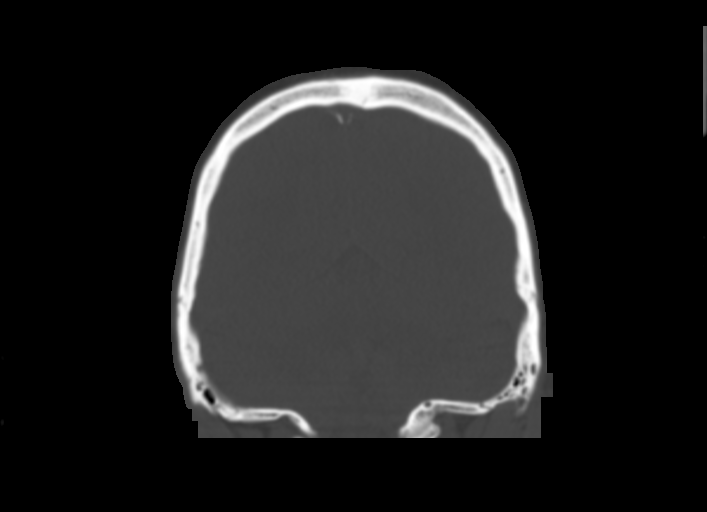

[Series 8: c spine soft · axial · 0.31mm/px · z∈[+1340,+1386]mm · 2 of 71 slices shown]
[im 24/71  soft-tissue]
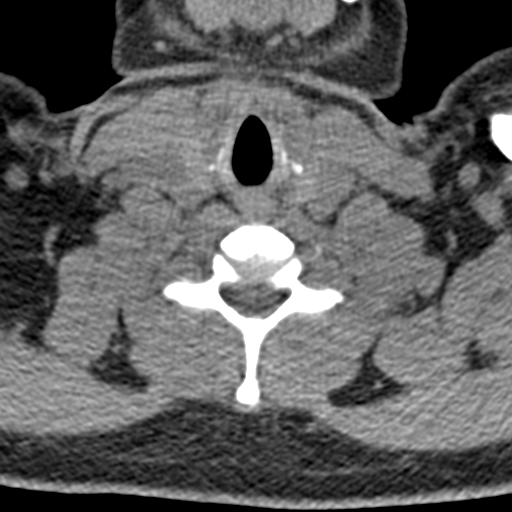
[im 47/71  soft-tissue]
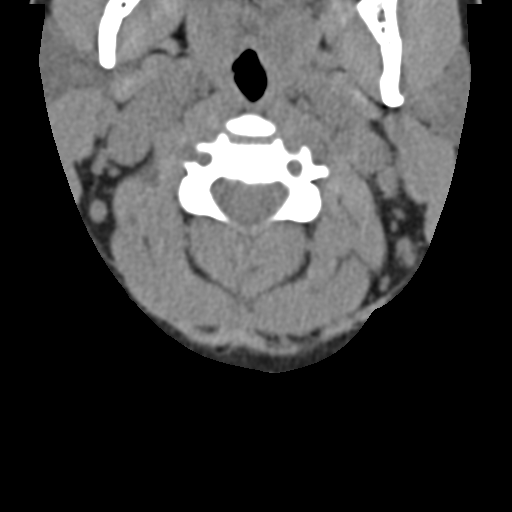

[Series 11: orthogonal bone · axial · 0.28mm/px · z∈[+1299,+1382]mm · 3 of 91 slices shown, 4 images]
[im 23/91  soft-tissue]
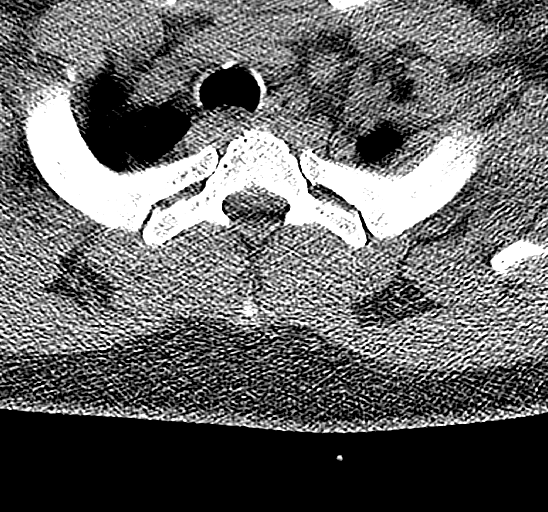
[im 23/91  bone]
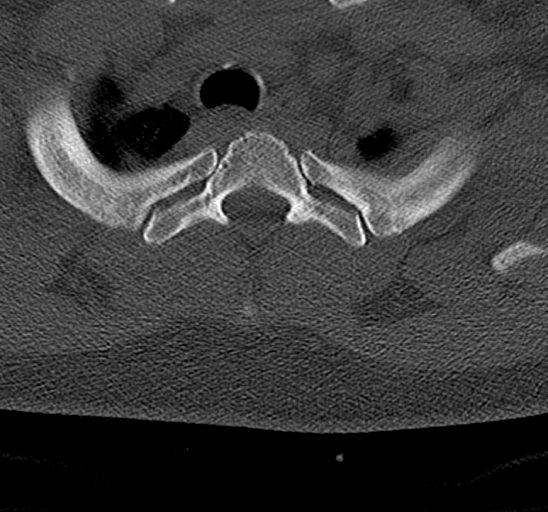
[im 46/91  bone]
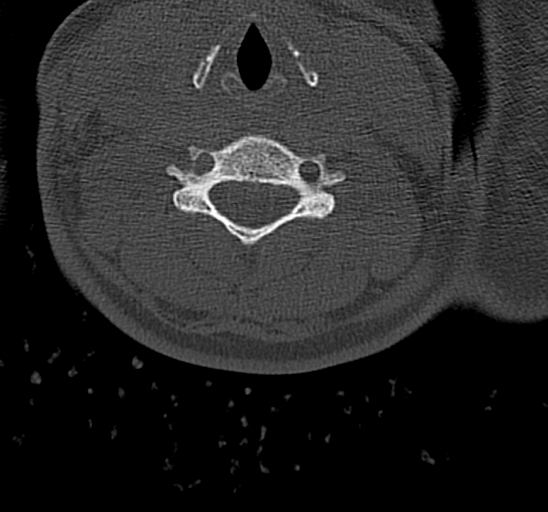
[im 68/91  bone]
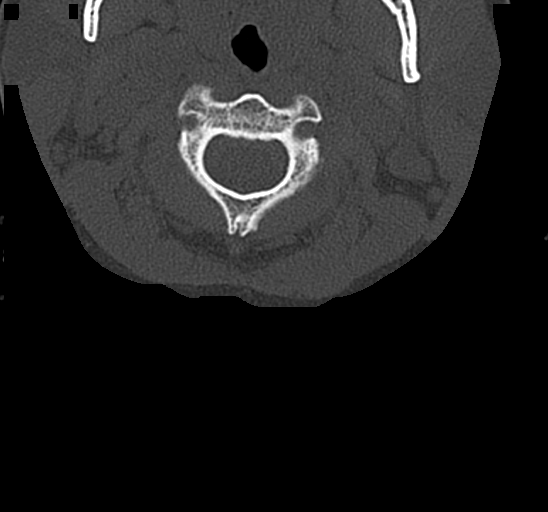

[Series 13: sagittal bone · sagittal · 0.33mm/px · 4 of 43 slices shown]
[im 9/43  bone]
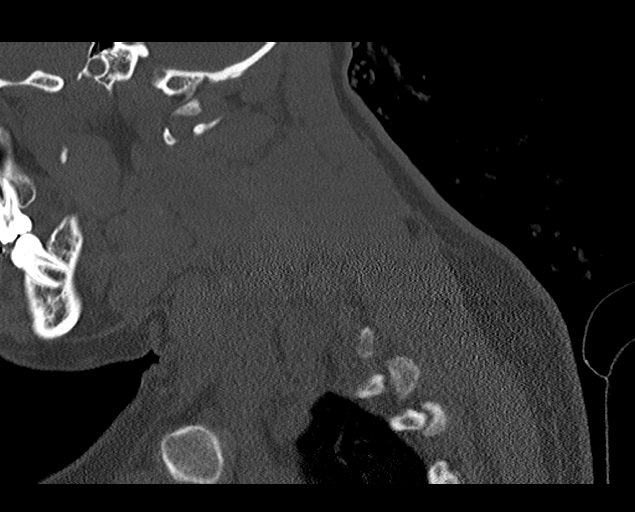
[im 17/43  bone]
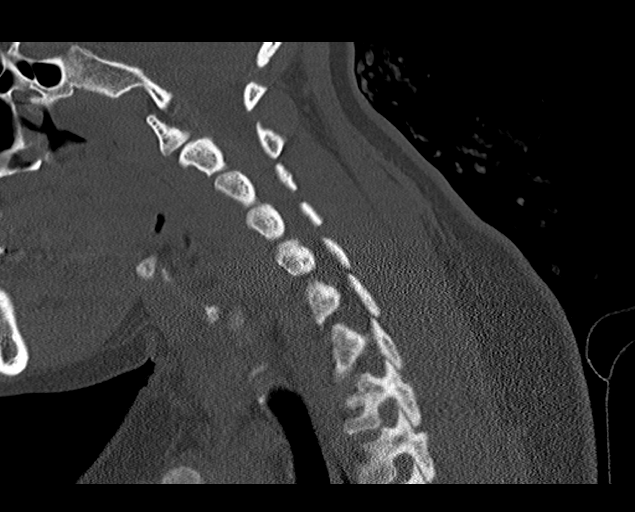
[im 26/43  bone]
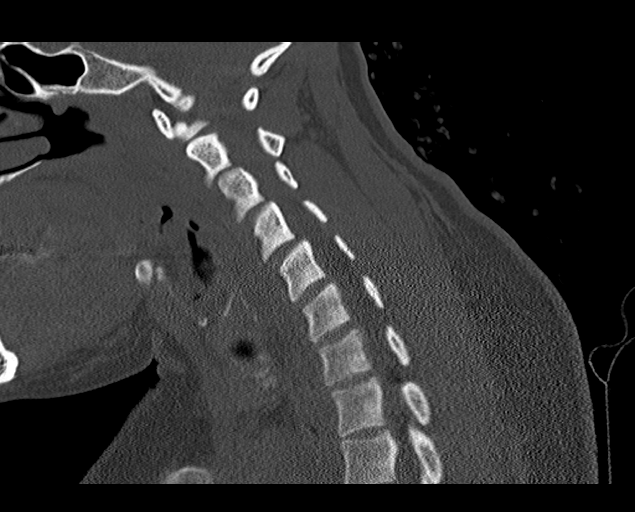
[im 34/43  bone]
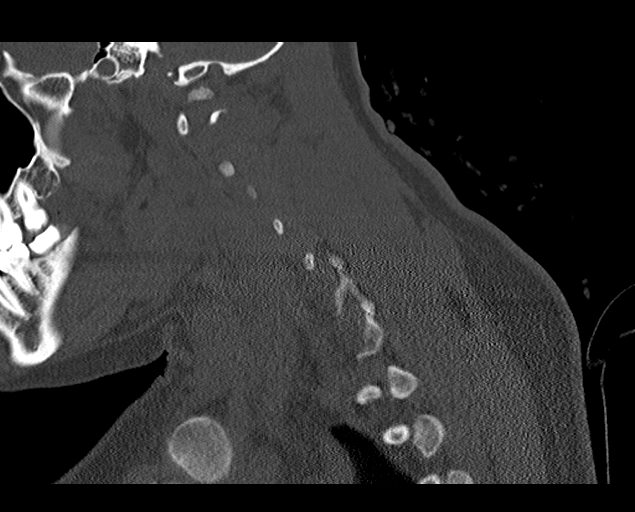

[12 of 33 positions shown; findings below may reference images not displayed]

FINDINGS: CT HEAD FINDINGS

Brain: No evidence of acute infarction, hemorrhage, hydrocephalus,
extra-axial collection or mass lesion/mass effect.

Vascular: No hyperdense vessel or unexpected calcification.

Skull: Normal. Negative for fracture or focal lesion.

Sinuses/Orbits: No acute finding.

Other: None.

CT CERVICAL SPINE FINDINGS

Alignment: There is kyphosis of cervical spine likely positional.

Skull base and vertebrae: No acute fracture. No primary bone lesion
or focal pathologic process.

Soft tissues and spinal canal: No prevertebral fluid or swelling. No
visible canal hematoma.

Disc levels: Intervertebral spaces are normal. No significant
degenerative joint changes are noted.

Upper chest: Negative.

Other: None.
IMPRESSION: Normal head CT.

No acute fracture or dislocation of cervical spine.

## 2018-02-18 MED ORDER — METHOCARBAMOL 750 MG PO TABS: 750.0000 mg | ORAL_TABLET | Freq: Four times a day (QID) | ORAL | 0 refills | Status: DC

## 2018-02-18 MED ORDER — IBUPROFEN 400 MG PO TABS: 400.0000 mg | ORAL_TABLET | Freq: Four times a day (QID) | ORAL | 0 refills | Status: DC | PRN

## 2018-02-18 MED ORDER — OXYCODONE-ACETAMINOPHEN 5-325 MG PO TABS
1.0000 | ORAL_TABLET | Freq: Once | ORAL | Status: AC
  Administered 2018-02-18: 1 via ORAL
  Filled 2018-02-18: qty 1

## 2018-02-18 MED ORDER — IBUPROFEN 200 MG PO TABS
400.0000 mg | ORAL_TABLET | Freq: Once | ORAL | Status: AC
  Administered 2018-02-18: 400 mg via ORAL
  Filled 2018-02-18: qty 2

## 2018-02-18 NOTE — ED Notes (Signed)
PT NOT IN ROOM. [place]

## 2018-02-18 NOTE — ED Notes (Signed)
RETURN FROM CT. PT IS AMBULATORY WITHOUT ASSISTANCE. TOLERATING PLAN OF CARE A THIS TIME

## 2018-02-18 NOTE — ED Provider Notes (Signed)
[facility]-EMERGENCY DEPT Provider Note   CSN: [id number] Arrival date & time: 02/18/18  1104     History   Chief Complaint Chief Complaint  Patient presents with  . Head Injury    HPI [name] [name] is a 32 y.o. female.  32 year old female presents with head and neck pain after mechanical fall just prior to arrival.  States that she tripped down approximately 8 steps and fell onto her head.  No loss of consciousness.  Complains of mid upper cervical neck pain without radicular symptoms.  No vomiting or visual changes.  Has mild frontal headache.  Symptoms persistent and no treatment used prior to arrival nothing makes it better.  Denies any chest or abdominal discomfort.  No pain to her hips or lower extremities.      Past Medical History:  Diagnosis Date  . Abnormal Pap smear of cervix     teenager,  repeat pap smear  . Headache(784.0)    tension only  . Heart murmur    no treatment for as child    Patient Active Problem List   Diagnosis Date Noted  . Chest pain 03/05/2016  . Murmur 03/05/2016  . Palpitations 02/19/2016  . Unspecified constipation 07/21/2014  . Headache(784.0) 11/16/2013    Past Surgical History:  Procedure Laterality Date  . BUNIONECTOMY  2005  . DILATION AND CURETTAGE OF UTERUS      OB History    Gravida Para Term Preterm AB Living   2       1 1    SAB TAB Ectopic Multiple Live Births   1               Home Medications    Prior to Admission medications   Medication Sig Start Date End Date Taking? Authorizing Provider  ibuprofen (ADVIL,MOTRIN) 600 MG tablet Take 600 mg by mouth 4 (four) times daily as needed for pain. 01/27/18  Yes [provider]  Multiple Vitamins-Minerals (ALIVE ONCE DAILY WOMENS PO) Take 1 tablet by mouth daily.   Yes [provider]  Vitamin D, Cholecalciferol, 1000 units CAPS Take 2,000 Units by mouth daily.   Yes [provider]  HYDROcodone-acetaminophen  (NORCO/VICODIN) 5-325 MG tablet Take 1 tablet by mouth every 6 (six) hours as needed for severe pain. Patient not taking: Reported on 02/18/2018 03/25/17   Felicie MornSmith, David, NP  methocarbamol (ROBAXIN) 500 MG tablet Take 1 tablet (500 mg total) by mouth 2 (two) times daily as needed for muscle spasms. Patient not taking: Reported on 02/18/2018 09/08/16   Ward, Chase PicketJaime Pilcher, PA-C  naproxen (NAPROSYN) 500 MG tablet Take 1 tablet (500 mg total) by mouth 2 (two) times daily as needed for moderate pain. Patient not taking: Reported on 02/18/2018 09/08/16   Ward, Chase PicketJaime Pilcher, PA-C  Norethindrone Acetate-Ethinyl Estrad-FE (LOESTRIN 24 FE) 1-20 MG-MCG(24) tablet Take 1 tablet by mouth daily. Patient not taking: Reported on 02/18/2018 01/31/16   Verner CholLeonard, Deborah S, CNM    Family History Family History  Problem Relation Age of Onset  . Hypertension Mother   . Thyroid disease Mother   . Hypertension Maternal Grandmother     Social History Social History   Tobacco Use  . Smoking status: Never Smoker  . Smokeless tobacco: Never Used  Substance Use Topics  . Alcohol use: Yes    Alcohol/week: 0.0 oz    Comment: Rare  . Drug use: No     Allergies   Patient has no known allergies.  Review of Systems Review of Systems  All other systems reviewed and are negative.    Physical Exam Updated Vital Signs Pulse 81   Temp 98.5 F (36.9 C) (Oral)   Resp 16   Ht 1.549 m (5\' 1" )   Wt 75.3 kg (166 lb)   LMP 01/28/2018 (Approximate)   SpO2 100%   BMI 31.37 kg/m   Physical Exam  Constitutional: She is oriented to person, place, and time. She appears well-developed and well-nourished.  Non-toxic appearance. No distress.  HENT:  Head: Normocephalic and atraumatic.  Eyes: Conjunctivae, EOM and lids are normal. Pupils are equal, round, and reactive to light.  Neck: Normal range of motion. Neck supple. No tracheal deviation present. No thyroid mass present.    Cardiovascular: Normal rate, regular  rhythm and normal heart sounds. Exam reveals no gallop.  No murmur heard. Pulmonary/Chest: Effort normal and breath sounds normal. No stridor. No respiratory distress. She has no decreased breath sounds. She has no wheezes. She has no rhonchi. She has no rales.  Abdominal: Soft. Normal appearance and bowel sounds are normal. She exhibits no distension. There is no tenderness. There is no rebound and no CVA tenderness.  Musculoskeletal: Normal range of motion. She exhibits no edema or tenderness.  Neurological: She is alert and oriented to person, place, and time. She has normal strength. No cranial nerve deficit or sensory deficit. Coordination and gait normal. GCS eye subscore is 4. GCS verbal subscore is 5. GCS motor subscore is 6.  Skin: Skin is warm and dry. No abrasion and no rash noted.  Psychiatric: She has a normal mood and affect. Her speech is normal and behavior is normal.  Nursing note and vitals reviewed.    ED Treatments / Results  Labs (all labs ordered are listed, but only abnormal results are displayed) Labs Reviewed - No data to display  EKG  EKG Interpretation None       Radiology No results found.  Procedures Procedures (including critical care time)  Medications Ordered in ED Medications  oxyCODONE-acetaminophen (PERCOCET/ROXICET) 5-325 MG per tablet 1 tablet (not administered)  ibuprofen (ADVIL,MOTRIN) tablet 400 mg (not administered)     Initial Impression / Assessment and Plan / ED Course  I have reviewed the triage vital signs and the nursing notes.  Pertinent labs & imaging results that were available during my care of the patient were reviewed by me and considered in my medical decision making (see chart for details).    Patient medicated for pain here.  CT of head and neck without acute process.  Return precautions given  Final Clinical Impressions(s) / ED Diagnoses   Final diagnoses:  None    ED Discharge Orders    None       Lorre Nick, MD 02/18/18 1454

## 2018-02-18 NOTE — ED Triage Notes (Addendum)
Patient states she slipped down the basement steps (8 steps) and hit the back of her head. Patient states she was wearing flip flops. Patient also has soreness of the posterior neck.  C-collar placed in triage.

## 2018-12-03 NOTE — L&D Delivery Note (Signed)
Delivery Note AROM with light meconium at 1055 with cat 1 tracing and AL with attempt at pushing and no reduction of AL.  Held off on pushing x 1hour and pt went to FD at about 1200.  Tracing cat 1 when not pushing and with deep decels with pushing.  Held off of pushing again for an hour and there was good descent.  Pt pushed well and at 1:36 PM a viable female was delivered via Vaginal, Spontaneous (Presentation: ROA ).  APGAR:8;9 weight  pending.   Placenta status: spont, intact .  Cord:  3V with the following complications: none.  Cord pH: n/a  Anesthesia:  epidural Episiotomy: None Lacerations:  1st degree vaginal Suture Repair: 3.0 vicryl Est. Blood Loss (mL):  400 cc with continuous light flow d/t atony.  875mcg cytotec placed per rectum.  Mom to postpartum.  Baby to Couplet care / Skin to Skin.  Delice Lesch 08/21/2019, 1:51 PM

## 2018-12-12 ENCOUNTER — Ambulatory Visit (INDEPENDENT_AMBULATORY_CARE_PROVIDER_SITE_OTHER): Payer: 59

## 2018-12-12 ENCOUNTER — Encounter: Payer: Self-pay | Admitting: Podiatry

## 2018-12-12 ENCOUNTER — Ambulatory Visit (INDEPENDENT_AMBULATORY_CARE_PROVIDER_SITE_OTHER): Payer: 59 | Admitting: Podiatry

## 2018-12-12 VITALS — BP 103/66 | HR 77 | Resp 16

## 2018-12-12 DIAGNOSIS — M779 Enthesopathy, unspecified: Secondary | ICD-10-CM | POA: Diagnosis not present

## 2018-12-12 DIAGNOSIS — M778 Other enthesopathies, not elsewhere classified: Secondary | ICD-10-CM

## 2018-12-12 DIAGNOSIS — M898X7 Other specified disorders of bone, ankle and foot: Secondary | ICD-10-CM

## 2018-12-12 MED ORDER — TRIAMCINOLONE ACETONIDE 10 MG/ML IJ SUSP
10.0000 mg | Freq: Once | INTRAMUSCULAR | Status: AC
  Administered 2018-12-12: 10 mg

## 2018-12-12 NOTE — Progress Notes (Signed)
Subjective:   Patient ID: [name] [name], female   DOB: 33 y.o.   MRN: [id number]   HPI Patient presents stating I have a bump on top of my right foot and its been very sore and almost impossible to wear shoe gear with.  Patient states is been going on for about 3 months and she is tried Therapist, sports and massage.  Patient does not smoke and likes to be active   Review of Systems  All other systems reviewed and are negative.       Objective:  Physical Exam Vitals signs and nursing note reviewed.  Constitutional:      Appearance: She is well-developed.  Pulmonary:     Effort: Pulmonary effort is normal.  Musculoskeletal: Normal range of motion.  Skin:    General: Skin is warm.  Neurological:     Mental Status: She is alert.     Neurovascular status intact muscle strength is adequate range of motion within normal limits with patient found to have a dorsal spur of the first metatarsocuneiform joint that is painful when pressed with moderate radiating discomfort occurring with inflammation around the area.  Patient has good digital perfusion well oriented x3     Assessment:  Acute tendinitis dorsal with bone spur formation with irritation of the tissue associated with the spur formation     Plan:  H&P condition reviewed and at this point I discussed options including surgical removal of spur versus trying to reduce the inflammation.  We will get a first try to reduce inflammation I did a sterile prep of the area and injected the inflammation tendon complex 3 mg dexamethasone Kenalog 5 mg Xylocaine quick question on the area advised on wider shoes and reappoint 3 weeks to recheck  X-ray indicates dorsal spur of the cuneiform right with no indications of other pathology

## 2018-12-12 NOTE — Progress Notes (Signed)
   Subjective:    Patient ID: [name] [name], female    DOB: 08/02/1986, 33 y.o.   MRN: [id number]  HPI    Review of Systems  All other systems reviewed and are negative.      Objective:   Physical Exam        Assessment & Plan:

## 2019-01-02 ENCOUNTER — Ambulatory Visit: Payer: 59 | Admitting: Podiatry

## 2019-06-24 ENCOUNTER — Encounter: Payer: Managed Care, Other (non HMO) | Attending: Obstetrics and Gynecology | Admitting: Registered"

## 2019-06-24 ENCOUNTER — Other Ambulatory Visit: Payer: Self-pay

## 2019-06-24 DIAGNOSIS — O99810 Abnormal glucose complicating pregnancy: Secondary | ICD-10-CM

## 2019-06-27 ENCOUNTER — Encounter: Payer: Self-pay | Admitting: Registered"

## 2019-06-27 NOTE — Progress Notes (Signed)
Patient was seen on 06/24/2019 for Gestational Diabetes self-management class at the [facility]. The following learning objectives were met by the patient during this course:   States the definition of Gestational Diabetes  States why dietary management is important in controlling blood glucose  Describes the effects each nutrient has on blood glucose levels  Demonstrates ability to create a balanced meal plan  Demonstrates carbohydrate counting   States when to check blood glucose levels  Demonstrates proper blood glucose monitoring techniques  States the effect of stress and exercise on blood glucose levels  States the importance of limiting caffeine and abstaining from alcohol and smoking  Blood glucose monitor given: [employer] Lot # [id number] X Exp: 09/02/19 Blood glucose reading: 122 mg/dL  Patient instructed to monitor glucose levels: FBS: 60 - <95; 1 hour: <140; 2 hour: <120  Patient received handouts:  Nutrition Diabetes and Pregnancy, including carb counting list  Patient will be seen for follow-up as needed.

## 2019-08-21 ENCOUNTER — Encounter (HOSPITAL_COMMUNITY): Payer: Self-pay

## 2019-08-21 ENCOUNTER — Inpatient Hospital Stay (HOSPITAL_COMMUNITY): Payer: Managed Care, Other (non HMO) | Admitting: Anesthesiology

## 2019-08-21 ENCOUNTER — Inpatient Hospital Stay (HOSPITAL_COMMUNITY)
Admission: AD | Admit: 2019-08-21 | Discharge: 2019-08-23 | DRG: 807 | Disposition: A | Discharge status: Home / Self Care | Payer: Managed Care, Other (non HMO) | Attending: Obstetrics and Gynecology | Admitting: Obstetrics and Gynecology

## 2019-08-21 ENCOUNTER — Other Ambulatory Visit: Payer: Self-pay

## 2019-08-21 DIAGNOSIS — O99824 Streptococcus B carrier state complicating childbirth: Secondary | ICD-10-CM | POA: Diagnosis present

## 2019-08-21 DIAGNOSIS — O24420 Gestational diabetes mellitus in childbirth, diet controlled: Principal | ICD-10-CM | POA: Diagnosis present

## 2019-08-21 DIAGNOSIS — Z20828 Contact with and (suspected) exposure to other viral communicable diseases: Secondary | ICD-10-CM | POA: Diagnosis present

## 2019-08-21 DIAGNOSIS — Z3A39 39 weeks gestation of pregnancy: Secondary | ICD-10-CM | POA: Diagnosis not present

## 2019-08-21 DIAGNOSIS — O24410 Gestational diabetes mellitus in pregnancy, diet controlled: Secondary | ICD-10-CM | POA: Diagnosis present

## 2019-08-21 DIAGNOSIS — Z23 Encounter for immunization: Secondary | ICD-10-CM | POA: Diagnosis not present

## 2019-08-21 DIAGNOSIS — O26893 Other specified pregnancy related conditions, third trimester: Secondary | ICD-10-CM | POA: Diagnosis present

## 2019-08-21 HISTORY — DX: Gestational diabetes mellitus in pregnancy, unspecified control: O24.419

## 2019-08-21 LAB — CBC
HCT: 36.3 % (ref 36.0–46.0)
Hemoglobin: 11.7 g/dL — ABNORMAL LOW (ref 12.0–15.0)
MCH: 27 pg (ref 26.0–34.0)
MCHC: 32.2 g/dL (ref 30.0–36.0)
MCV: 83.8 fL (ref 80.0–100.0)
Platelets: 169 10*3/uL (ref 150–400)
RBC: 4.33 MIL/uL (ref 3.87–5.11)
RDW: 14.6 % (ref 11.5–15.5)
WBC: 8 10*3/uL (ref 4.0–10.5)
nRBC: 0 % (ref 0.0–0.2)

## 2019-08-21 LAB — TYPE AND SCREEN
ABO/RH(D): O POS
Antibody Screen: NEGATIVE

## 2019-08-21 LAB — SARS CORONAVIRUS 2 BY RT PCR (HOSPITAL ORDER, PERFORMED IN ~~LOC~~ HOSPITAL LAB): SARS Coronavirus 2: NEGATIVE

## 2019-08-21 LAB — GLUCOSE, CAPILLARY: Glucose-Capillary: 92 mg/dL (ref 70–99)

## 2019-08-21 MED ORDER — SENNOSIDES-DOCUSATE SODIUM 8.6-50 MG PO TABS
2.0000 | ORAL_TABLET | ORAL | Status: DC
  Administered 2019-08-21 – 2019-08-22 (×2): 2 via ORAL
  Filled 2019-08-21 (×2): qty 2

## 2019-08-21 MED ORDER — EPHEDRINE 5 MG/ML INJ: 10.0000 mg | INTRAVENOUS | Status: DC | PRN

## 2019-08-21 MED ORDER — OXYCODONE-ACETAMINOPHEN 5-325 MG PO TABS: 1.0000 | ORAL_TABLET | ORAL | Status: DC | PRN

## 2019-08-21 MED ORDER — WITCH HAZEL-GLYCERIN EX PADS
1.0000 "application " | MEDICATED_PAD | CUTANEOUS | Status: DC | PRN
  Administered 2019-08-23: 1 via TOPICAL

## 2019-08-21 MED ORDER — SODIUM CHLORIDE 0.9 % IV SOLN
5.0000 10*6.[IU] | Freq: Once | INTRAVENOUS | Status: AC
  Administered 2019-08-21: 09:00:00 5 10*6.[IU] via INTRAVENOUS
  Filled 2019-08-21: qty 5

## 2019-08-21 MED ORDER — FLEET ENEMA 7-19 GM/118ML RE ENEM: 1.0000 | ENEMA | Freq: Every day | RECTAL | Status: DC | PRN

## 2019-08-21 MED ORDER — PENICILLIN G 3 MILLION UNITS IVPB - SIMPLE MED
3.0000 10*6.[IU] | INTRAVENOUS | Status: DC
  Filled 2019-08-21: qty 100

## 2019-08-21 MED ORDER — LACTATED RINGERS IV SOLN: 500.0000 mL | INTRAVENOUS | Status: DC | PRN

## 2019-08-21 MED ORDER — LIDOCAINE HCL (PF) 1 % IJ SOLN: 30.0000 mL | INTRAMUSCULAR | Status: DC | PRN

## 2019-08-21 MED ORDER — OXYCODONE HCL 5 MG PO TABS: 5.0000 mg | ORAL_TABLET | ORAL | Status: DC | PRN

## 2019-08-21 MED ORDER — PRENATAL MULTIVITAMIN CH
1.0000 | ORAL_TABLET | Freq: Every day | ORAL | Status: DC
  Filled 2019-08-21: qty 1

## 2019-08-21 MED ORDER — SIMETHICONE 80 MG PO CHEW: 80.0000 mg | CHEWABLE_TABLET | ORAL | Status: DC | PRN

## 2019-08-21 MED ORDER — BENZOCAINE-MENTHOL 20-0.5 % EX AERO
1.0000 "application " | INHALATION_SPRAY | CUTANEOUS | Status: DC | PRN
  Administered 2019-08-21: 1 via TOPICAL
  Filled 2019-08-21: qty 56

## 2019-08-21 MED ORDER — DIPHENHYDRAMINE HCL 50 MG/ML IJ SOLN: 12.5000 mg | INTRAMUSCULAR | Status: DC | PRN

## 2019-08-21 MED ORDER — TETANUS-DIPHTH-ACELL PERTUSSIS 5-2.5-18.5 LF-MCG/0.5 IM SUSP: 0.5000 mL | Freq: Once | INTRAMUSCULAR | Status: DC

## 2019-08-21 MED ORDER — SODIUM CHLORIDE (PF) 0.9 % IJ SOLN
INTRAMUSCULAR | Status: DC | PRN
  Administered 2019-08-21: 12 mL/h via EPIDURAL

## 2019-08-21 MED ORDER — PHENYLEPHRINE 40 MCG/ML (10ML) SYRINGE FOR IV PUSH (FOR BLOOD PRESSURE SUPPORT): 80.0000 ug | PREFILLED_SYRINGE | INTRAVENOUS | Status: DC | PRN

## 2019-08-21 MED ORDER — PHENYLEPHRINE 40 MCG/ML (10ML) SYRINGE FOR IV PUSH (FOR BLOOD PRESSURE SUPPORT)
80.0000 ug | PREFILLED_SYRINGE | INTRAVENOUS | Status: DC | PRN
  Filled 2019-08-21: qty 10

## 2019-08-21 MED ORDER — ACETAMINOPHEN 325 MG PO TABS: 650.0000 mg | ORAL_TABLET | ORAL | Status: DC | PRN

## 2019-08-21 MED ORDER — FENTANYL CITRATE (PF) 100 MCG/2ML IJ SOLN
50.0000 ug | INTRAMUSCULAR | Status: DC | PRN
  Administered 2019-08-21: 09:00:00 100 ug via INTRAVENOUS
  Filled 2019-08-21: qty 2

## 2019-08-21 MED ORDER — ONDANSETRON HCL 4 MG/2ML IJ SOLN: 4.0000 mg | INTRAMUSCULAR | Status: DC | PRN

## 2019-08-21 MED ORDER — MISOPROSTOL 200 MCG PO TABS
ORAL_TABLET | ORAL | Status: AC
  Administered 2019-08-21: 14:00:00 800 ug via RECTAL
  Filled 2019-08-21: qty 4

## 2019-08-21 MED ORDER — IBUPROFEN 100 MG/5ML PO SUSP
600.0000 mg | Freq: Four times a day (QID) | ORAL | Status: DC
  Administered 2019-08-21 – 2019-08-23 (×7): 600 mg via ORAL
  Filled 2019-08-21 (×8): qty 30

## 2019-08-21 MED ORDER — COCONUT OIL OIL: 1.0000 "application " | TOPICAL_OIL | Status: DC | PRN

## 2019-08-21 MED ORDER — LACTATED RINGERS IV SOLN: 500.0000 mL | Freq: Once | INTRAVENOUS | Status: DC

## 2019-08-21 MED ORDER — ONDANSETRON HCL 4 MG PO TABS: 4.0000 mg | ORAL_TABLET | ORAL | Status: DC | PRN

## 2019-08-21 MED ORDER — LIDOCAINE HCL (PF) 1 % IJ SOLN
INTRAMUSCULAR | Status: DC | PRN
  Administered 2019-08-21 (×2): 5 mL via EPIDURAL

## 2019-08-21 MED ORDER — SOD CITRATE-CITRIC ACID 500-334 MG/5ML PO SOLN: 30.0000 mL | ORAL | Status: DC | PRN

## 2019-08-21 MED ORDER — MISOPROSTOL 200 MCG PO TABS
800.0000 ug | ORAL_TABLET | Freq: Once | ORAL | Status: AC
  Administered 2019-08-21: 14:00:00 800 ug via RECTAL

## 2019-08-21 MED ORDER — ZOLPIDEM TARTRATE 5 MG PO TABS: 5.0000 mg | ORAL_TABLET | Freq: Every evening | ORAL | Status: DC | PRN

## 2019-08-21 MED ORDER — OXYTOCIN 40 UNITS IN NORMAL SALINE INFUSION - SIMPLE MED
2.5000 [IU]/h | INTRAVENOUS | Status: DC
  Filled 2019-08-21: qty 1000

## 2019-08-21 MED ORDER — FENTANYL-BUPIVACAINE-NACL 0.5-0.125-0.9 MG/250ML-% EP SOLN
12.0000 mL/h | EPIDURAL | Status: DC | PRN
  Filled 2019-08-21: qty 250

## 2019-08-21 MED ORDER — LACTATED RINGERS IV SOLN
INTRAVENOUS | Status: DC
  Administered 2019-08-21: 09:00:00 via INTRAVENOUS

## 2019-08-21 MED ORDER — OXYCODONE-ACETAMINOPHEN 5-325 MG PO TABS: 2.0000 | ORAL_TABLET | ORAL | Status: DC | PRN

## 2019-08-21 MED ORDER — ONDANSETRON HCL 4 MG/2ML IJ SOLN: 4.0000 mg | Freq: Four times a day (QID) | INTRAMUSCULAR | Status: DC | PRN

## 2019-08-21 MED ORDER — INFLUENZA VAC SPLIT QUAD 0.5 ML IM SUSY
0.5000 mL | PREFILLED_SYRINGE | INTRAMUSCULAR | Status: AC
  Administered 2019-08-22: 11:00:00 0.5 mL via INTRAMUSCULAR
  Filled 2019-08-21: qty 0.5

## 2019-08-21 MED ORDER — DIBUCAINE (PERIANAL) 1 % EX OINT: 1.0000 "application " | TOPICAL_OINTMENT | CUTANEOUS | Status: DC | PRN

## 2019-08-21 MED ORDER — DIPHENHYDRAMINE HCL 25 MG PO CAPS: 25.0000 mg | ORAL_CAPSULE | Freq: Four times a day (QID) | ORAL | Status: DC | PRN

## 2019-08-21 MED ORDER — OXYTOCIN BOLUS FROM INFUSION
500.0000 mL | Freq: Once | INTRAVENOUS | Status: AC
  Administered 2019-08-21: 500 mL via INTRAVENOUS

## 2019-08-21 NOTE — Anesthesia Preprocedure Evaluation (Addendum)
Anesthesia Evaluation  Patient identified by MRN, date of birth, ID band Patient awake    Reviewed: Allergy & Precautions, H&P , NPO status , Patient's Chart, lab work & pertinent test results  History of Anesthesia Complications Negative for: history of anesthetic complications  Airway Mallampati: II  TM Distance: >3 FB Neck ROM: full    Dental no notable dental hx. (+) Teeth Intact   Pulmonary neg pulmonary ROS,    Pulmonary exam normal breath sounds clear to auscultation       Cardiovascular negative cardio ROS Normal cardiovascular exam Rhythm:regular Rate:Normal     Neuro/Psych  Headaches, PSYCHIATRIC DISORDERS    GI/Hepatic negative GI ROS, Neg liver ROS,   Endo/Other  negative endocrine ROSdiabetes  Renal/GU negative Renal ROS  negative genitourinary   Musculoskeletal   Abdominal (+) + obese,   Peds  Hematology negative hematology ROS (+)   Anesthesia Other Findings   Reproductive/Obstetrics (+) Pregnancy                             Anesthesia Physical Anesthesia Plan  ASA: II  Anesthesia Plan: Epidural   Post-op Pain Management:    Induction:   PONV Risk Score and Plan:   Airway Management Planned:   Additional Equipment:   Intra-op Plan:   Post-operative Plan:   Informed Consent: I have reviewed the patients History and Physical, chart, labs and discussed the procedure including the risks, benefits and alternatives for the proposed anesthesia with the patient or authorized representative who has indicated his/her understanding and acceptance.       Plan Discussed with:   Anesthesia Plan Comments:         Anesthesia Quick Evaluation

## 2019-08-21 NOTE — Anesthesia Postprocedure Evaluation (Signed)
Anesthesia Post Note  Patient: [name] [name]  Procedure(s) Performed: AN AD [place]     Patient location during evaluation: Mother Baby Anesthesia Type: Epidural Level of consciousness: awake and alert Pain management: pain level controlled Vital Signs Assessment: post-procedure vital signs reviewed and stable Respiratory status: spontaneous breathing, nonlabored ventilation and respiratory function stable Cardiovascular status: stable Postop Assessment: no headache, no backache and epidural receding Anesthetic complications: no    Last Vitals:  Vitals:   08/21/19 1431 08/21/19 1510  BP: (!) 128/95 117/75  Pulse:  99  Resp:  20  Temp:  36.9 C  SpO2:  100%    Last Pain:  Vitals:   08/21/19 1510  TempSrc: Oral  PainSc: 0-No pain   Pain Goal: Patients Stated Pain Goal: 0 (08/21/19 0649)                 Barkley Boards

## 2019-08-21 NOTE — MAU Note (Signed)
Covid swab collected.PT asymptomatic. PT tolerated well 

## 2019-08-21 NOTE — H&P (Signed)
[name] [name] is a 33 y.o. female presenting to the MAU in early labor.  [name] [name] from MAU called me at [phone] with report that pt had changed from 3-4/70/-3 at 0705 to 5-6cm now.  Tracing reactive and pt is GBS + and was 1cm yesterday.  Routine orders for admission verbally requested as well as PCN for GBS + and IV pain medication or epidural upon request.  No LOF or VB reported.  Upon seeing pt, she reported bloody show and still no leakage of fluid.  I discussed AROM, r/b/a and likelihood of increased intensity with contractions and although she had been trying to go naturally wanted an epidural first and then AROM.  She had already received one dose of fentanyl.  OB History    Gravida  4   Para  1   Term  1   Preterm      AB  1   Living  1     SAB  1   TAB      Ectopic      Multiple      Live Births  1          Past Medical History:  Diagnosis Date  . Abnormal Pap smear of cervix     teenager,  repeat pap smear  . Gestational diabetes   . Headache(784.0)    tension only  . Heart murmur    no treatment for as child   Past Surgical History:  Procedure Laterality Date  . BUNIONECTOMY  2005  . DILATION AND CURETTAGE OF UTERUS     Family History: family history includes Hypertension in her maternal grandmother and mother; Thyroid disease in her mother. Social History:  reports that she has never smoked. She has never used smokeless tobacco. She reports current alcohol use. She reports that she does not use drugs.     Maternal Diabetes: Yes:  Diabetes Type:  Diet controlled Genetic Screening: Normal Maternal Ultrasounds/Referrals: Normal Fetal Ultrasounds or other Referrals:  None Maternal Substance Abuse:  No Significant Maternal Medications:  Meds include: Other: valacyclovir Significant Maternal Lab Results:  Group B Strep positive Other Comments:  None  ROS  Non-contributory, denies HA, visual changes, n/v/f/c/d History Dilation: 9 Effacement (%): 70,  80 Station: -1, 0 Exam by:: Katori Wirsing Blood pressure (!) 111/56, pulse 93, temperature 98.5 F (36.9 C), temperature source Oral, resp. rate 18, height 5\' 1"  (1.549 m), weight 85.3 kg, SpO2 100 %. Exam Physical Exam  Lungs CTA CV RRR Abd gravid, NT Ext no calf tenderness FHT  Toco U/s yesterday 7lbs 11oz, vtx, nl fluid  Prenatal labs: ABO, Rh:  O + Antibody:  negative Rubella:  Immune RPR:   NR HBsAg:   Negative HIV:   NR GBS:   Positive  Assessment/Plan: P1 at 39 3/7wks in labor with cat 1 and reactive fetal tracing.  Denies prodromal HSV sxs and taking valacyclovir.  Epidural now per pt request and then AROM.   Delice Lesch 08/21/2019, 10:30 AM

## 2019-08-21 NOTE — MAU Note (Signed)
Contractions since 3 am, every 5 min apart now.  No leaking. No bleeding. Baby moving well. 1 cm at last exam.

## 2019-08-21 NOTE — Anesthesia Procedure Notes (Signed)
Epidural Patient location during procedure: OB Start time: 08/21/2019 10:29 AM End time: 08/21/2019 10:39 AM  Staffing Anesthesiologist: Murvin Natal, MD Performed: anesthesiologist   Preanesthetic Checklist Completed: patient identified, site marked, pre-op evaluation, timeout performed, IV checked, risks and benefits discussed and monitors and equipment checked  Epidural Patient position: sitting Prep: DuraPrep Patient monitoring: heart rate, cardiac monitor, continuous pulse ox and blood pressure Approach: midline Location: L4-L5 Injection technique: LOR air  Needle:  Needle type: Tuohy  Needle gauge: 17 G Needle length: 9 cm Needle insertion depth: 6 cm Catheter type: closed end flexible Catheter size: 19 Gauge Catheter at skin depth: 11 cm Test dose: negative and Other  Assessment Events: blood not aspirated, injection not painful, no injection resistance and negative IV test  Additional Notes Informed consent obtained prior to proceeding including risk of failure, 1% risk of PDPH, risk of minor discomfort and bruising. Discussed alternatives to epidural analgesia and patient desires to proceed.  Timeout performed pre-procedure verifying patient name, procedure, and platelet count.  Patient tolerated procedure well. Reason for block:procedure for pain

## 2019-08-22 DIAGNOSIS — O24410 Gestational diabetes mellitus in pregnancy, diet controlled: Secondary | ICD-10-CM | POA: Diagnosis present

## 2019-08-22 LAB — CBC
HCT: 32.6 % — ABNORMAL LOW (ref 36.0–46.0)
Hemoglobin: 10.9 g/dL — ABNORMAL LOW (ref 12.0–15.0)
MCH: 27.9 pg (ref 26.0–34.0)
MCHC: 33.4 g/dL (ref 30.0–36.0)
MCV: 83.4 fL (ref 80.0–100.0)
Platelets: 165 10*3/uL (ref 150–400)
RBC: 3.91 MIL/uL (ref 3.87–5.11)
RDW: 14.9 % (ref 11.5–15.5)
WBC: 9.9 10*3/uL (ref 4.0–10.5)
nRBC: 0 % (ref 0.0–0.2)

## 2019-08-22 LAB — GLUCOSE, CAPILLARY: Glucose-Capillary: 140 mg/dL — ABNORMAL HIGH (ref 70–99)

## 2019-08-22 LAB — RPR: RPR Ser Ql: NONREACTIVE

## 2019-08-22 MED ORDER — COMPLETENATE 29-1 MG PO CHEW
1.0000 | CHEWABLE_TABLET | Freq: Every day | ORAL | Status: DC
  Administered 2019-08-22: 1 via ORAL
  Filled 2019-08-22: qty 1

## 2019-08-22 NOTE — Progress Notes (Signed)
PPD #1 SVD  S:  Reports feeling very well             Tolerating po/ No nausea or vomiting, Last meal @ ~0200             Bleeding is light, no clots             Pain controlled with acetaminophen and ibuprofen (OTC)             Up ad lib / ambulatory / voiding and stooling w/o difficulty  Newborn-[name] [name]" Breast / Circumcision- after D/C @ [facility]  O:               VS: BP 107/60 (BP Location: Left Arm)   Pulse 96   Temp 98.2 F (36.8 C) (Oral)   Resp 18   Ht 5\' 1"  (1.549 m)   Wt 85.3 kg   SpO2 98%   Breastfeeding Unknown   BMI 35.52 kg/m    LABS:  AM Blood glucose 140, not fasting             Recent Labs    08/21/19 0817 08/22/19 0539  WBC 8.0 9.9  HGB 11.7* 10.9*  PLT 169 165               Blood type: --/--/O POS (09/18 1259)  Rubella:  Immune                     I&O: Intake/Output      09/18 0701 - 09/19 0700 09/19 0701 - 09/20 0700   I.V. (mL/kg) 0 (0)    Other 0    Total Intake(mL/kg) 0 (0)    Urine (mL/kg/hr) 350 (0.2)    Blood 400    Total Output 750    Net -750                       Physical Exam:             Alert and oriented X3  Lungs: Clear and unlabored  Heart: regular rate and rhythm / no mumurs  Abdomen: soft, non-tender, non-distended              Fundus: firm, non-tender, U-2  Perineum: well- approximated, healing  Lochia: appropriate  Extremities: No edema, no calf pain or tenderness    A: PPD # 1   SVD 9/18  First degree perineal lac  GDM A1  Breastfeeding  Doing well - stable status    P: Routine post partum orders  Repeat fasting BG in AM  Plan D/C 9/20  Out-patient circ @ [facility]   08/22/19 10:15 AM Arrie Eastern, CNM

## 2019-08-22 NOTE — Lactation Note (Signed)
This note was copied from a baby's chart. Lactation Consultation Note  Patient Name: [name] [name] [id number] Date: 08/22/2019 Reason for consult: Follow-up assessment;Infant weight loss  Baby is 21 hours old  Baby latched well with depth , swallows and per mom comfortable.  Baby still feeding at 10 mins.  LC reviewed basics , importance of STS feedings until the baby is  Back to birth weight, gaining steadily and can stay awake for majority of  Feeding. Feed with cues and if it has been awhile since the baby has fed STS .  Breast feeding goal 8-12 feedings in 24 hours. Its been 11 years since mom has  Breast fed.  Per mom has DEBP [place] at home.    Maternal Data Has patient been taught Hand Expression?: Yes  Feeding Feeding Type: Breast Fed  LATCH Score Latch: Grasps breast easily, tongue down, lips flanged, rhythmical sucking.  Audible Swallowing: Spontaneous and intermittent  Type of Nipple: Everted at rest and after stimulation  Comfort (Breast/Nipple): Soft / non-tender  Hold (Positioning): No assistance needed to correctly position infant at breast.  LATCH Score: 10  Interventions Interventions: Breast feeding basics reviewed;Breast compression  Lactation Tools Discussed/Used WIC Program: No Pump Review: Milk Storage Initiated by:: MAI Date initiated:: 08/22/19   Consult Status Consult Status: Follow-up Date: 08/23/19 Follow-up type: In-patient    [place] 08/22/2019, 10:54 AM

## 2019-08-22 NOTE — Lactation Note (Signed)
This note was copied from a baby's chart. Lactation Consultation Note Baby 45 hrs old.  Mom stated baby is BF well. Stated latching good.  Noted softening of breast baby BF on verses opposite breast. Mom has inverted centers in her nipples, has everted nipple crown when stimulated. Very compressible. Encouraged to pre-pump and or finger stimulation before latching. Hand expression w/thick colostrum noted after several hand expressions. Mom has BF for 2 months w/her first child. Newborn behavior, STS, I&O, obtaining deep latches, breast massage, supply and demand discussed.  Mom encouraged to feed baby 8-12 times/24 hours and with feeding cues.  Gave mom hand pump for pre-pumping before latching.  Encouraged mom to call for assistance or questions. Lactation brochure given.  Patient Name: [name] [name] SHUOH'F Date: 08/22/2019 Reason for consult: Initial assessment;Term   Maternal Data Has patient been taught Hand Expression?: Yes Does the patient have breastfeeding experience prior to this delivery?: Yes  Feeding    LATCH Score       Type of Nipple: Inverted(everts well w/stimulation)  Comfort (Breast/Nipple): Soft / non-tender        Interventions Interventions: Breast feeding basics reviewed;Position options;Support pillows;Breast massage;Hand express;Pre-pump if needed;Breast compression;Hand pump  Lactation Tools Discussed/Used     Consult Status Consult Status: Follow-up Date: 08/23/19 Follow-up type: In-patient    Aliesha Dolata, Elta [place] 08/22/2019, 1:07 AM

## 2019-08-22 NOTE — Plan of Care (Signed)
Problem: Elimination: Goal: Will not experience complications related to urinary retention Outcome: Progressing Note: Per pt report, she voids normal amts at normal frequency without pain or difficulty.   Problem: Pain Managment: Goal: General experience of comfort will improve Outcome: Progressing Note: Pain has been managed with scheduled Motrin.   Problem: Education: Goal: Knowledge of condition will improve Outcome: Progressing   Problem: Activity: Goal: Will verbalize the importance of balancing activity with adequate rest periods Outcome: Progressing   Problem: Life Cycle: Goal: Chance of risk for complications during the postpartum period will decrease Outcome: Progressing Note: Fundas firm, lochia WNL without clots.   Problem: Role Relationship: Goal: Ability to demonstrate positive interaction with newborn will improve Outcome: Progressing

## 2019-08-23 LAB — GLUCOSE, CAPILLARY: Glucose-Capillary: 76 mg/dL (ref 70–99)

## 2019-08-23 MED ORDER — BENZOCAINE-MENTHOL 20-0.5 % EX AERO: 1.0000 "application " | INHALATION_SPRAY | CUTANEOUS | Status: DC | PRN

## 2019-08-23 MED ORDER — IBUPROFEN 100 MG/5ML PO SUSP: 600.0000 mg | Freq: Four times a day (QID) | ORAL | Status: DC

## 2019-08-23 MED ORDER — ACETAMINOPHEN 325 MG PO TABS: 650.0000 mg | ORAL_TABLET | ORAL | Status: DC | PRN

## 2019-08-23 MED ORDER — COCONUT OIL OIL: 1.0000 "application " | TOPICAL_OIL | 0 refills | Status: DC | PRN

## 2019-08-23 NOTE — Lactation Note (Signed)
This note was copied from a baby's chart. Lactation Consultation Note  Patient Name: [name] [name] [id number] Date: 08/23/2019   Baby 52 hours old and sleeping STS on mother's chest. Per parents baby has not stooled in 24 hours.   Mother states she has only been breastfeeding on one breast per session and alternating. Suggest breastfeeding on both breasts when baby is willing per session. Reviewed hand expression with good flow.   Suggest mother hand express before latching. Feed on demand with cues.  Goal 8-12+ times per day after first 24 hrs.  Place baby STS if not cueing.  Reviewed engorgement care and monitoring voids/stools. Suggest mother post pump w/pump for 10 min after feeding and give volume back to baby until baby stools.     Maternal Data    Feeding    LATCH Score                   Interventions    Lactation Tools Discussed/Used     Consult Status      Carlye Grippe 08/23/2019, 9:52 AM

## 2019-08-23 NOTE — Discharge Summary (Addendum)
Obstetric Discharge Summary Reason for Admission: onset of labor, GDM A1 Prenatal Procedures: ultrasound Intrapartum Procedures: spontaneous vaginal delivery, GBS prophylaxis and epidural Postpartum Procedures: none Complications-Operative and Postpartum: 1st degree perineal laceration Hemoglobin  Date Value Ref Range Status  08/22/2019 10.9 (L) 12.0 - 15.0 g/dL Final   HCT  Date Value Ref Range Status  08/22/2019 32.6 (L) 36.0 - 46.0 % Final    Physical Exam:  General: alert, cooperative and no distress Lochia: appropriate Uterine Fundus: firm Incision: healing well DVT Evaluation: No cords or calf tenderness. No significant calf/ankle edema.  Discharge Diagnoses: Active Problems:   SVD (9/18)   First degree perineal laceration   Gestational diabetes mellitus in pregnancy, diet controlled    Discharge Information: Date: 08/23/2019 Activity: pelvic rest Diet: routine Medications:  Allergies as of 08/23/2019   No Known Allergies     Medication List    STOP taking these medications   valACYclovir 500 MG tablet Commonly known as: VALTREX     TAKE these medications   acetaminophen 325 MG tablet Commonly known as: Tylenol Take 2 tablets (650 mg total) by mouth every 4 (four) hours as needed (for pain scale < 4).   ALIVE ONCE DAILY WOMENS PO Take 1 tablet by mouth daily.   benzocaine-Menthol 20-0.5 % Aero Commonly known as: DERMOPLAST Apply 1 application topically as needed for irritation (perineal discomfort).   calcium carbonate 500 MG chewable tablet Commonly known as: TUMS - dosed in mg elemental calcium Chew 1 tablet by mouth as needed for indigestion or heartburn.   coconut oil Oil Apply 1 application topically as needed.   ibuprofen 100 MG/5ML suspension Commonly known as: ADVIL Take 30 mLs (600 mg total) by mouth every 6 (six) hours.            Discharge Care Instructions  (From admission, onward)         Start     Ordered   08/23/19  0000  Discharge wound care:    Comments: Sitz baths 2 times /day with warm water x 1 week. May add herbals: 1 ounce dried comfrey leaf* 1 ounce calendula flowers 1 ounce lavender flowers 1/2 ounce dried uva ursi leaves 1/2 ounce witch hazel blossoms (if you can find them) 1/2 ounce dried sage leaf 1/2 cup sea salt Directions: Bring 2 quarts of water to a boil. Turn off heat, and place 1 ounce (approximately 1 large handful) of the above mixed herbs (not the salt) into the pot. Steep, covered, for 30 minutes.  Strain the liquid well with a fine mesh strainer, and discard the herb material. Add 2 quarts of liquid to the tub, along with the 1/2 cup of salt. This medicinal liquid can also be made into compresses and peri-rinses.   08/23/19 0849         Condition: stable Instructions: per hospital booklet Contraception planned: condoms Discharge to: home Follow-up Information    Ob/Gyn, [place]. Schedule an appointment as soon as possible for a visit in 6 week(s).   Specialty: Obstetrics and Gynecology Contact information: [address] [place] [address] [phone]           Newborn Data: Live born female "KJ" Birth Weight: 7 lb 11.8 oz (3510 g) APGAR: 9, 9  Newborn Delivery   Birth date/time: 08/21/2019 13:36:00 Delivery type: Vaginal, Spontaneous      Home with mother.  [name] [name], CNM 08/23/2019, 8:49 AM

## 2019-09-08 ENCOUNTER — Ambulatory Visit: Payer: Self-pay

## 2019-09-08 NOTE — Lactation Note (Signed)
This note was copied from a baby's chart. Lactation Consultation Note  Patient Name: [name] [name]. Today's Date: 09/08/2019     09/08/2019  Name: [name] [name]. MRN: [id number] Date of Birth: 08/21/2019 Gestational Age: Gestational Age: [redacted]w[redacted]d Birth Weight: 123.8 oz Weight today:    8 pounds 11.1 ounces (3944 grams) with clean size 3 diaper  53 week old infant presents today with mom for feeding assessment. Mom is concerned with milk production and is able to pump about 1 ounce per pumping. Mom was only BF infant until she noted he was not stooling and uric acid crystals in his urine for prolonged period. This has resolved since starting the formula around 9/24.   Infant has gained 624 grams in the last 16 days with an average daily weight gain of 39 grams a day. Infant is well over his birthweight.   Infant is fed every 2-3 hours at the breast offering both breasts with each feeding, she is then offering 2 ounces of formula after each feeding and is feeding infant the pumped milk that she is pumping. Mom reports infant is having some spitting after eating. Pediatrician is involved and feels like infant may be overfeeding. Infant is now getting about an ounce of supplement after each feeding now. Infant still seems hungry after some feedings and she gives more if needed.   Infant is sleepy at the breast with feeding, mom is providing stimulation.   Mom is pumping every 2-3 hours with a Lansinoh pump and getting about an ounce per pumping. She had the # 24 flanges that she was using and ordered the # 30 flanges to try.   Mom reports she BF her first infant for 2 months and she lost her milk supply. She did have milk in the beginning and then she lost her supply. Mom was able to exclusively BF in the beginning until her milk supply dwindled.   Infant with thick labial frenulum that inserts at the bottom of the gum ridge. Upper lip with good flangability. Infant with short  thin anterior lingual frenulum that inserts 1/2 way to the tip of the tongue. Infant with decreased mid tongue elevation. Infant with tongue extension past his gum line and good tongue lateralization. Infant sleepy at the breast and needing stimulation to maintain suckling. Infant with jaw quivering post feeding. Infant drooled a little on the bottle at the end of the feeding. Gave mom local providers and website information on Tongue and lip restrictions. Recommended infant be seen by Oral Specialist to have tongue evaluated.   Discussed Fenugreek, Mother Love More Milk Special Blend, Milk flow Plus and Reglan. Reviewed side effects and dosages with mom. Discussed renting a hospital grade pump from the gift shop at [name] as an option also.   Mom feels like she is staying hydrated and eating well. She is concerned with milk supply the most. Infant latches on and mom is not experiencing any further pain with feedings as she was in the beginning.   Mom is aware to continue to supplement infant as she is not making enough milk to feed the infant.   Infant to follow up with Dr. Burt Knack at 16 month of age. Infant to follow up with Lactation in 2 weeks. Mom to call with questions/concerns as needed.     General Information: Mother's reason for visit: Feeding assessment, concerns with milk supply Consult: Initial Lactation consultant: Nonah Mattes RN,IBCLC Breastfeeding experience: Latching to both breasts with  each feeding and then supplementing with bottle Maternal medical conditions: Gestational diabetes mellitus Maternal medications: Pre-natal vitamin, Motrin (ibuprofen)  Breastfeeding History: Frequency of breast feeding: every 2-3 hours Duration of feeding: 15-20 minutes  Supplementation: Supplement method: bottle(Tommie Tippee, slow flow nipple) Brand: Enfamil Formula volume: 1-2 ounces Formula frequency: every 2-3 hours   Breast milk volume: 1 ounce Breast milk frequency: 5-6 x a  day   Pump type: Other(Lansinoh # 24 flanges) Pump frequency: every 2-3 hours Pump volume: 1 ounce  Infant Output Assessment: Voids per 24 hours: 7+ Urine color: Clear yellow Stools per 24 hours: 5-7 Stool color: Yellow  Breast Assessment: Breast: Soft, Compressible Nipple: Erect Pain level: 0 Pain interventions: Bra, Breast pump  Feeding Assessment: Infant oral assessment: Variance Infant oral assessment comment: see note Positioning: Cross cradle(left breast, 10 minutes) Latch: 1 - Repeated attempts needed to sustain latch, nipple held in mouth throughout feeding, stimulation needed to elicit sucking reflex. Audible swallowing: 1 - A few with stimulation Type of nipple: 2 - Everted at rest and after stimulation Comfort: 2 - Soft/non-tender Hold: 2 - No assistance needed to correctly position infant at breast LATCH score: 8 Latch assessment: Deep Lips flanged: Yes Suck assessment: Displays both   Pre-feed weight: 3944 grams Post feed weight: 3958 grams Amount transferred: 14 ml Amount supplemented: 0  Additional Feeding Assessment: Infant oral assessment: Variance Infant oral assessment comment: see note Positioning: Football(right breast, 10 minutes) Latch: 1 - Repeated attempts neede to sustain latch, nipple held in mouth throughout feeding, stimulation needed to elicit sucking reflex. Audible swallowing: 1 - A few with stimulation Type of nipple: 2 - Everted at rest and after stimulation Comfort: 2 - Soft/non-tender Hold: 2 - No assistance needed to correctly position infant at breast LATCH score: 8 Latch assessment: Deep Lips flanged: Yes Suck assessment: Displays both   Pre-feed weight: 3958 grams Post feed weight: 3958 grams Amount transferred: 0 Amount supplemented: 60 ml formula via bottle  Totals: Total amount transferred: 14 ml Total supplement given: 60 ml formula via bottle Total amount pumped post feed: did not pump  1. Offer infant the  breast with feeding cues 2. Keep infant awake as needed with feedings 3. Feed infant skin to skin 4. Massage/compress breast with feedings to keep infant awake and active at the breast 5. Empty one breast before offering the second breast 6. Offer infant the second breast if he is still cueing to feed after he finishes the first breast 7. Offer infant the bottle of pumped breast milk or formula after breast feeding, feed infant until he is satisfied. Continue using your Tommie Tippee Bottles, if choking or drooling on the bottle, try the extra slow flow nipple 8. Infant needs about 73-98 ml (2.5-3.5 ounces) for 8 feedings a day or 585-780 ml (20-26 ounces) in 24 hours. Infant may take more or less depending on how often he feeds. Feed infant until he is satisfied.  9. Would recommend that you continue pumping 7-8 times a day after breast feeding to promote and protect milk supply. Pump using your double electric breast pump and pumping both breast with each feeding 10. Can try Fenugreek, Mother Love more Milk Plus or Milk Flow Plus Supplements to see about increasing milk volume.  11. Some women find that Reglan is helpful for increasing milk supply. It has to be to obtained from your OB. Some OB will not prescribe. The usual dosage is 10 mg three times a day for 10-30 days.  12. Keep up the good work 13. Thank you for allowing me to assist you today 14. Please call with any questions/concerns as needed [phone] or [phone] 15. Follow up with Lactation in 2 weeks  Ed Blalock RN, IBCLC                                                        Ed Blalock 09/08/2019, 8:35 AM

## 2019-09-22 ENCOUNTER — Ambulatory Visit: Payer: Self-pay

## 2019-09-22 NOTE — Lactation Note (Signed)
This note was copied from a baby's chart. Lactation Consultation Note  Patient Name: [name] [name]. Today's Date: 09/22/2019     09/22/2019  Name: Edwinna Areola [facility]. MRN: [id number] Date of Birth: 08/21/2019 Gestational Age: Gestational Age: [redacted]w[redacted]d Birth Weight: 123.8 oz Weight today:    10 pounds 7.3 ounces (4744 grams) with clean size 1 diaper  Infant presents with mom for follow up feeding assessment. Infant post tongue release 1 week ago by Dr. Rogelia Rohrer. Mom has follow up with Dr. Rogelia Rohrer again tomorrow.   Infant with case of oral thrush that covers his mouth, roof of mouth, cheeks, gums, and lips. Mom reports they called in Nystatin yesterday. She is picking it up today. Reviewed Patient Instructions for Care of Ginette Pitman for Mother and Pecola Leisure.   Mom reports she has some itchiness to her nipples. Infant has been refusing the breast currently. Discussed treating infant mouth some before we latch him to the breast.   Mom reports she is pumping about 3-4 x a day and her milk supply is dwindling to just drops. Mom is finding it hard to pump more. Mom is using Mother's Milk Tea and lactation Cookies. She is eating Oatmeal and drinking a lot of water. Mom is planning to rent the Hospital Grade pump. Mom has ordered some Lactation Supplement to try also.   Mom using the Dr. Theora Gianotti Preemie nipple with no choking or drooling noted.   Infant with small diamond shape under tongue. Diamond did increase in size with good stretching. Infant did not eat well in the office today. We did not latch him due to the thrush infant has in his mouth today. Will try again once mouth starts clearing some. Parents are following up with Dr. Rogelia Rohrer tomorrow.   Infant to follow up with Dr. Excell Seltzer at 1 month. Infant to follow up with Lactation in 2 weeks.       General Information: Mother's reason for visit: Follow up feeding assessment, post tongue and lip releases by Dr. Rogelia Rohrer 1 week  ago Consult: Follow-up Lactation consultant: Noralee Stain RN,IBCLC Breastfeeding experience: not wanting to latch since TT release Maternal medical conditions: Gestational diabetes mellitus Maternal medications: Pre-natal vitamin, Other(Mother's Milk Tea)  Breastfeeding History: Frequency of breast feeding: 2 x  this week Duration of feeding: 15 minutes  Supplementation: Supplement method: bottle(Dr. Brown's) Brand: Enfamil Formula volume: 2-4 ounces Formula frequency: every 2-3 hours     Breast milk frequency: few gtts when available   Pump type: Other(Lansinoh) Pump frequency: 3-4 times a day Pump volume: gtts  Infant Output Assessment: Voids per 24 hours: 6-7 times Urine color: Clear yellow Stools per 24 hours: 4-5 Stool color: Yellow  Breast Assessment: Breast: Soft, Compressible Nipple: Erect Pain level: 0 Pain interventions: Bra, Breast pump  Feeding Assessment: Infant oral assessment: Variance Infant oral assessment comment: see note                                Additional Feeding Assessment:                                    Totals:   Total supplement given: 2 ounces formula via bottle Total amount pumped post feed: did not pump   Plan:   1. Offer infant the breast with feeding cues once his mouth clears some 2. Keep infant  awake as needed with feedings 3. Feed infant skin to skin 4. Massage/compress breast with feedings to keep infant awake and active at the breast 5. Empty one breast before offering the second breast 6. Offer infant the second breast if he is still cueing to feed after he finishes the first breast 7. Offer infant the bottle of pumped breast milk or formula after breast feeding, feed infant until he is satisfied. Continue using your Tommie Tippee Bottles, if choking or drooling on the bottle, try the extra slow flow nipple 8. Infant needs about 88-118 ml (3-4 ounces) for 8 feedings a day or 705-940 ml  (24-31ounces) in 24 hours. Infant may take more or less depending on how often he feeds. Feed infant until he is satisfied.  9. Would recommend that you continue pumping 7-8 times a day after breast feeding to promote and protect milk supply. Pump using your double electric breast pump and pumping both breast with each feeding 10. Can try Fenugreek, Mother Love more Milk Plus or Milk Flow Plus Supplements to see about increasing milk volume.  11. Some women find that Reglan is helpful for increasing milk supply. It has to be to obtained from your OB. Some OB will not prescribe. The usual dosage is 10 mg three times a day for 10-30 days.  12. Keep up the good work 51. Thank you for allowing me to assist you today 14. Please call with any questions/concerns as needed [phone] or [phone] 15. Follow up with Lactation in 2 weeks   [place], IBCLC                                                     Debby Freiberg Suvi Archuletta 09/22/2019, 10:39 AM

## 2019-10-06 ENCOUNTER — Ambulatory Visit: Payer: Self-pay

## 2019-10-06 NOTE — Lactation Note (Signed)
This note was copied from a baby's chart. Lactation Consultation Note  Patient Name: Christene Slates. Today's Date: 10/06/2019     10/06/2019  Name: Braxton Feathers [facility]. MRN: [id number] Date of Birth: 08/21/2019 Gestational Age: Gestational Age: [redacted]w[redacted]d Birth Weight: 123.8 oz Weight today:  Weight: 11 lb 12.9 oz (56109 g)   110 week old infant presents today with mom for feeding assessment.  Infant is feeding every 2-3 hours with the bottle and is taking 4 ounces per feeding.   Infant still has a case of oral thrush that is on lips, gums, tongue and roof of mouth. Infant now has a diaper rash, mom is using Desitin. Infant has been treated for 2 weeks with Nystatin. Outer lips are cleared since last visit. Mom reports it seemed to get better and is now increasing in amount again.  Enc mom to call Ped to discuss.   Infant has gained 880 grams in the last 14 days with an average daily weight gain of 63 grams a day.   Mom is pumping with a Symphony pump and is taking a lactation Support product. She is seeing a slight increase in her milk supply.   Mom latched infant to the breast and infant latched easily. He had been fed 2.5 ounces prior to latching. Nipple rounded post feeding. Mom with no pain with feeding. She did great with positioning and supporting infant with feeding. Infant did stay latched for short periods and would relatch easily. Infant got to the point where he got frustrated and mom stopped the feeding.   Enc mom to offer the breast after offering a partial bottle first or when infant sleepy and increase the time nursing as infant will.   Reviewed with mom that since infant has Thrush, she will need to treat her breast after each feeding to prevent the spread of yeast. Reviewed that if she gets burning or stinging pain in breasts after feeding or pumping that she may require Diflucan for treatment.   Infant to follow up with Dr. Burt Knack on November 20. Infant to follow  up with Lactation in 3 weeks at mom's request.      General Information: Mother's reason for visit: Follow up feeding assessment Consult: Follow-up Lactation consultant: Nonah Mattes RN,IBCLC Breastfeeding experience: not latching currently Maternal medical conditions: Gestational diabetes mellitus Maternal medications: Pre-natal vitamin, Other([employer] Postnatal Lactation Plus 1 capsule BID)  Breastfeeding History: Frequency of breast feeding: not currently latching    Supplementation: Supplement method: bottle(Dr. Brown's Level 1 nipple)   Formula volume: 4 ounces Formula frequency: every 2-3 hours   Breast milk volume: 1/2 ounces Breast milk frequency: 4-5 x a day   Pump type: Symphony Pump frequency: 4-5 x a day for 15-20 minutes Pump volume: 1/2 ounces per pumping  Infant Output Assessment: Voids per 24 hours: 8+ Urine color: Clear yellow Stools per 24 hours: 2-4 Stool color: Yellow  Breast Assessment: Breast: Soft, Compressible Nipple: Erect Pain level: 0 Pain interventions: Bra, Breast pump  Feeding Assessment: Infant oral assessment: Variance Infant oral assessment comment: see note- Infant with upper lip that flanges Positioning: Cross cradle(left and right breast) Latch: 1 - Repeated attempts needed to sustain latch, nipple held in mouth throughout feeding, stimulation needed to elicit sucking reflex. Audible swallowing: 1 - A few with stimulation Type of nipple: 2 - Everted at rest and after stimulation Comfort: 2 - Soft/non-tender Hold: 2 - No assistance needed to correctly position infant at breast LATCH score:  8 Latch assessment: Deep Lips flanged: Yes Suck assessment: Displays both   Pre-feed weight: 5354 grams Post feed weight: did not reweigh, very few swallows      Additional Feeding Assessment:                                    Totals:       1. Offer infant the breast with feeding cues once his mouth clears  some 2. Keep infant awake as needed with feedings 3. Feed infant skin to skin 4. Massage/compress breast with feedings to keep infant awake and active at the breast 5. Empty one breast before offering the second breast 6. Offer infant the second breast if he is still cueing to feed after he finishes the first breast 7. Offer infant the breast when he is sleepy or drowsy or after he has taken a partial bottle 8. Offer infant the bottle of pumped breast milk or formula after breast feeding, feed infant until he is satisfied. Continue using your Tommie Tippee Bottles, if choking or drooling on the bottle, try the extra slow flow nipple 9. Infant needs about 99-133 ml (3-4.5 ounces) for 8 feedings a day or [phone] ml (27+ounces) in 24 hours. Infant may take more or less depending on how often he feeds. Feed infant until he is satisfied.  10. Would recommend that you continue pumping 7-8 times a day after breast feeding to promote and protect milk supply. Pump using your double electric breast pump and pumping both breast with each feeding 11. Some women find that Reglan is helpful for increasing milk supply. It has to be to obtained from your OB. Some OB will not prescribe. The usual dosage is 10 mg three times a day for 10-30 days.  12. Keep up the good work 13. Thank you for allowing me to assist you today 14. Please call with any questions/concerns as needed [phone] or [phone] 15. Follow up with Lactation in 3 weeks   Ed Blalock RN, IBCLC                                                     Silas Flood Lenah Messenger 10/06/2019, 10:24 AM

## 2019-11-11 ENCOUNTER — Emergency Department (HOSPITAL_BASED_OUTPATIENT_CLINIC_OR_DEPARTMENT_OTHER): Payer: Managed Care, Other (non HMO)

## 2019-11-11 ENCOUNTER — Emergency Department: Payer: 59

## 2019-11-11 ENCOUNTER — Emergency Department (HOSPITAL_BASED_OUTPATIENT_CLINIC_OR_DEPARTMENT_OTHER)
Admission: EM | Admit: 2019-11-11 | Discharge: 2019-11-11 | Disposition: A | Discharge status: Home / Self Care | Payer: Managed Care, Other (non HMO) | Attending: Emergency Medicine | Admitting: Emergency Medicine

## 2019-11-11 ENCOUNTER — Other Ambulatory Visit: Payer: Self-pay

## 2019-11-11 ENCOUNTER — Encounter (HOSPITAL_BASED_OUTPATIENT_CLINIC_OR_DEPARTMENT_OTHER): Payer: Self-pay | Admitting: Emergency Medicine

## 2019-11-11 DIAGNOSIS — Z20828 Contact with and (suspected) exposure to other viral communicable diseases: Secondary | ICD-10-CM | POA: Insufficient documentation

## 2019-11-11 DIAGNOSIS — R05 Cough: Secondary | ICD-10-CM | POA: Insufficient documentation

## 2019-11-11 DIAGNOSIS — R6889 Other general symptoms and signs: Secondary | ICD-10-CM

## 2019-11-11 DIAGNOSIS — R0789 Other chest pain: Secondary | ICD-10-CM | POA: Insufficient documentation

## 2019-11-11 DIAGNOSIS — R0602 Shortness of breath: Secondary | ICD-10-CM | POA: Diagnosis present

## 2019-11-11 DIAGNOSIS — R0989 Other specified symptoms and signs involving the circulatory and respiratory systems: Secondary | ICD-10-CM

## 2019-11-11 DIAGNOSIS — Z79899 Other long term (current) drug therapy: Secondary | ICD-10-CM | POA: Insufficient documentation

## 2019-11-11 DIAGNOSIS — J029 Acute pharyngitis, unspecified: Secondary | ICD-10-CM | POA: Diagnosis not present

## 2019-11-11 LAB — SARS CORONAVIRUS 2 (TAT 6-24 HRS): SARS Coronavirus 2: NEGATIVE

## 2019-11-11 IMAGING — DX DG NECK SOFT TISSUE
2 series · 2 of 2 positions shown · non-contrast
Comparison: None.

CLINICAL DATA: Chest pain radiating to the left arm for 3 days.
Anterior neck congestion

EXAM:
NECK SOFT TISSUES - 1+ VIEW

[neck lat]
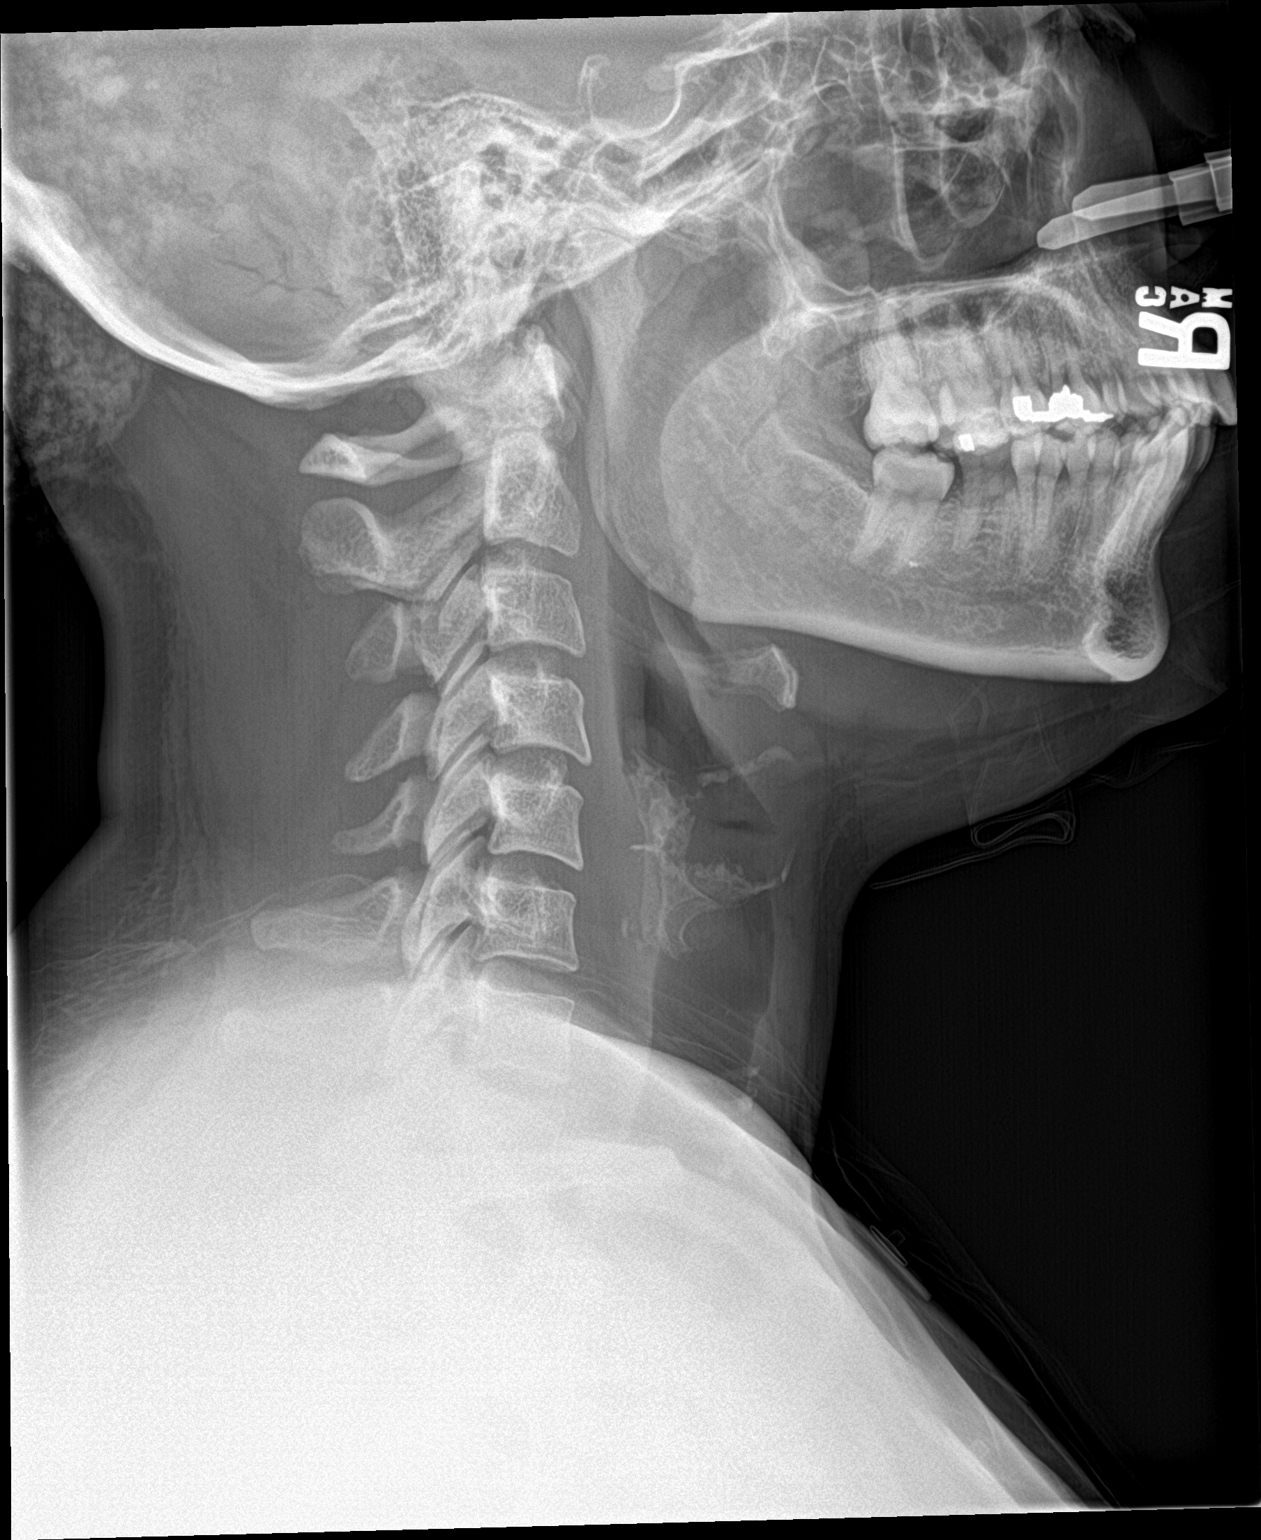

[neck ap]
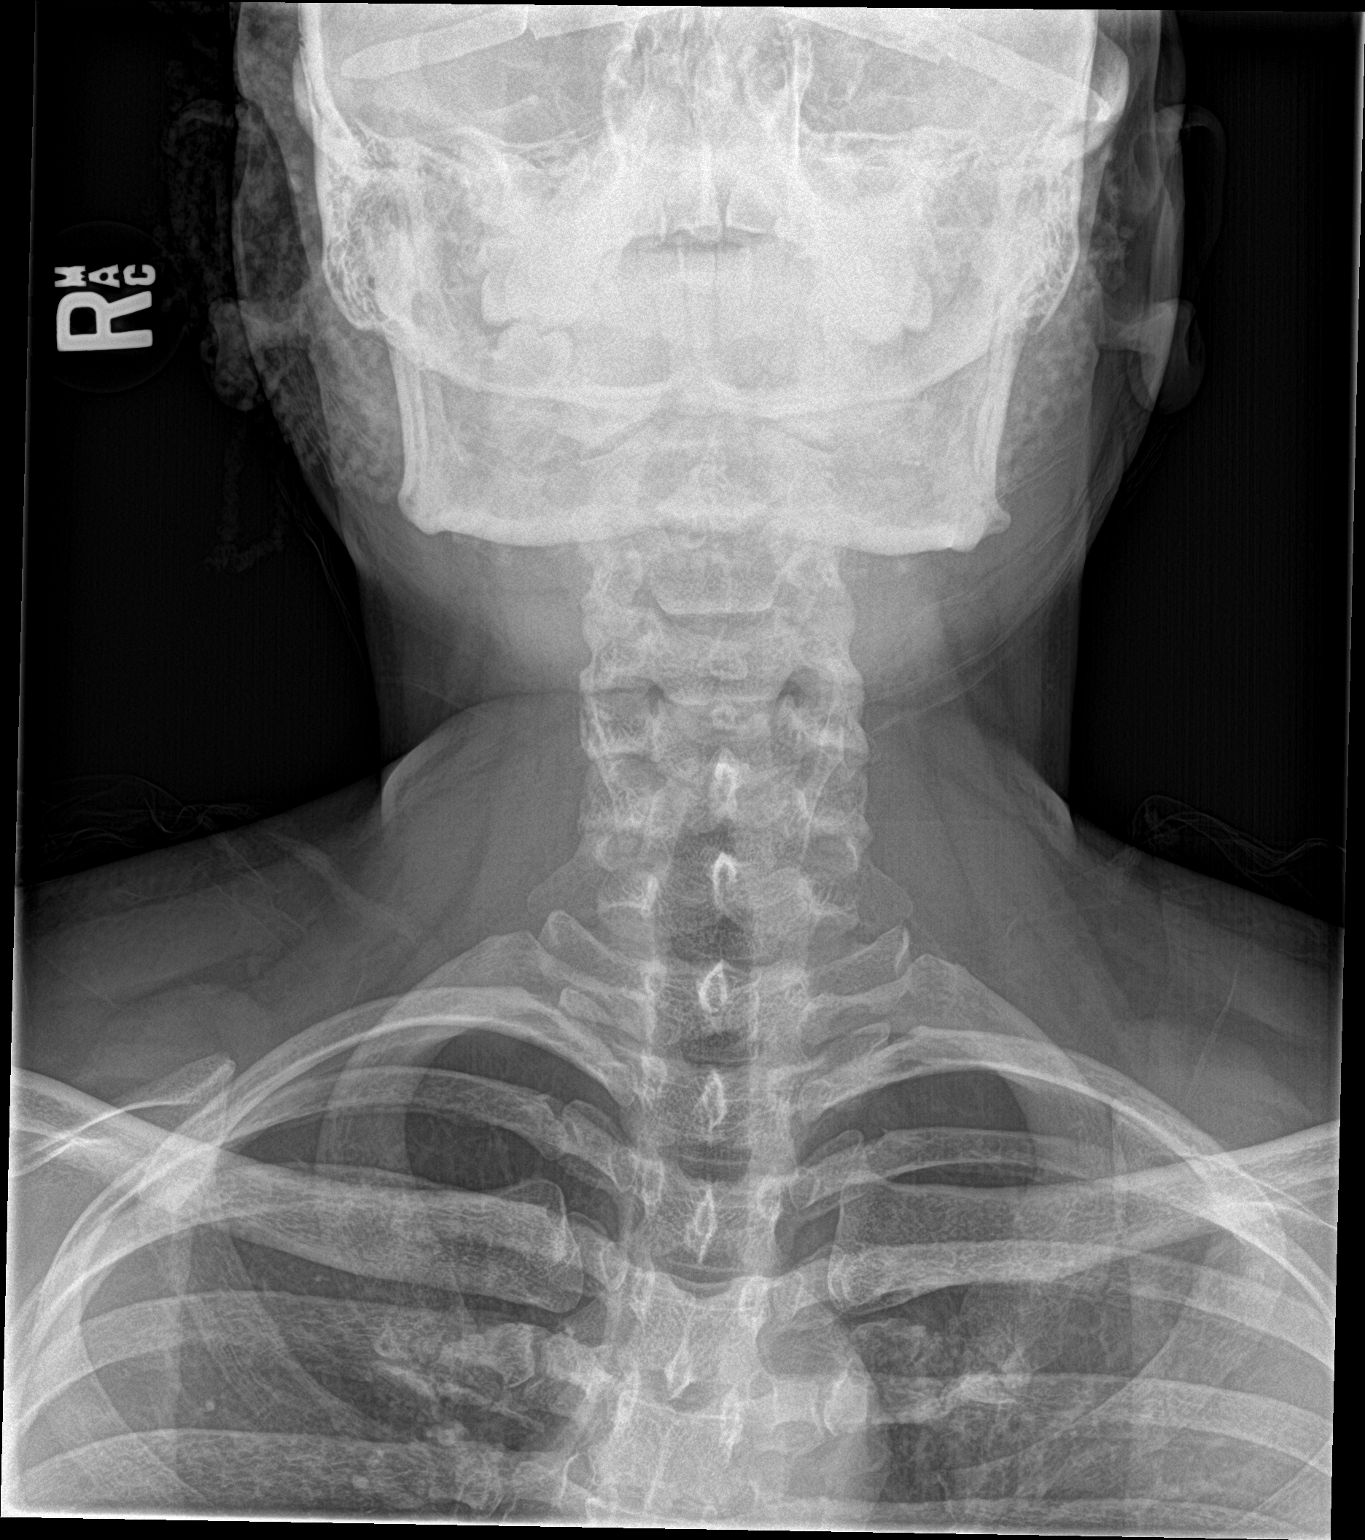

[2 of 2 positions shown; findings below may reference images not displayed]

FINDINGS: There is no evidence of retropharyngeal soft tissue swelling or
epiglottic enlargement. The cervical airway is unremarkable and no
radio-opaque foreign body identified.
IMPRESSION: Negative.

## 2019-11-11 IMAGING — DX DG CHEST 2V
2 series · 2 of 2 positions shown · non-contrast
Comparison: [DATE]

CLINICAL DATA: Shortness of breath with chest pain radiating to the
left arm for 3 days

EXAM:
CHEST - 2 VIEW

[chest pa]
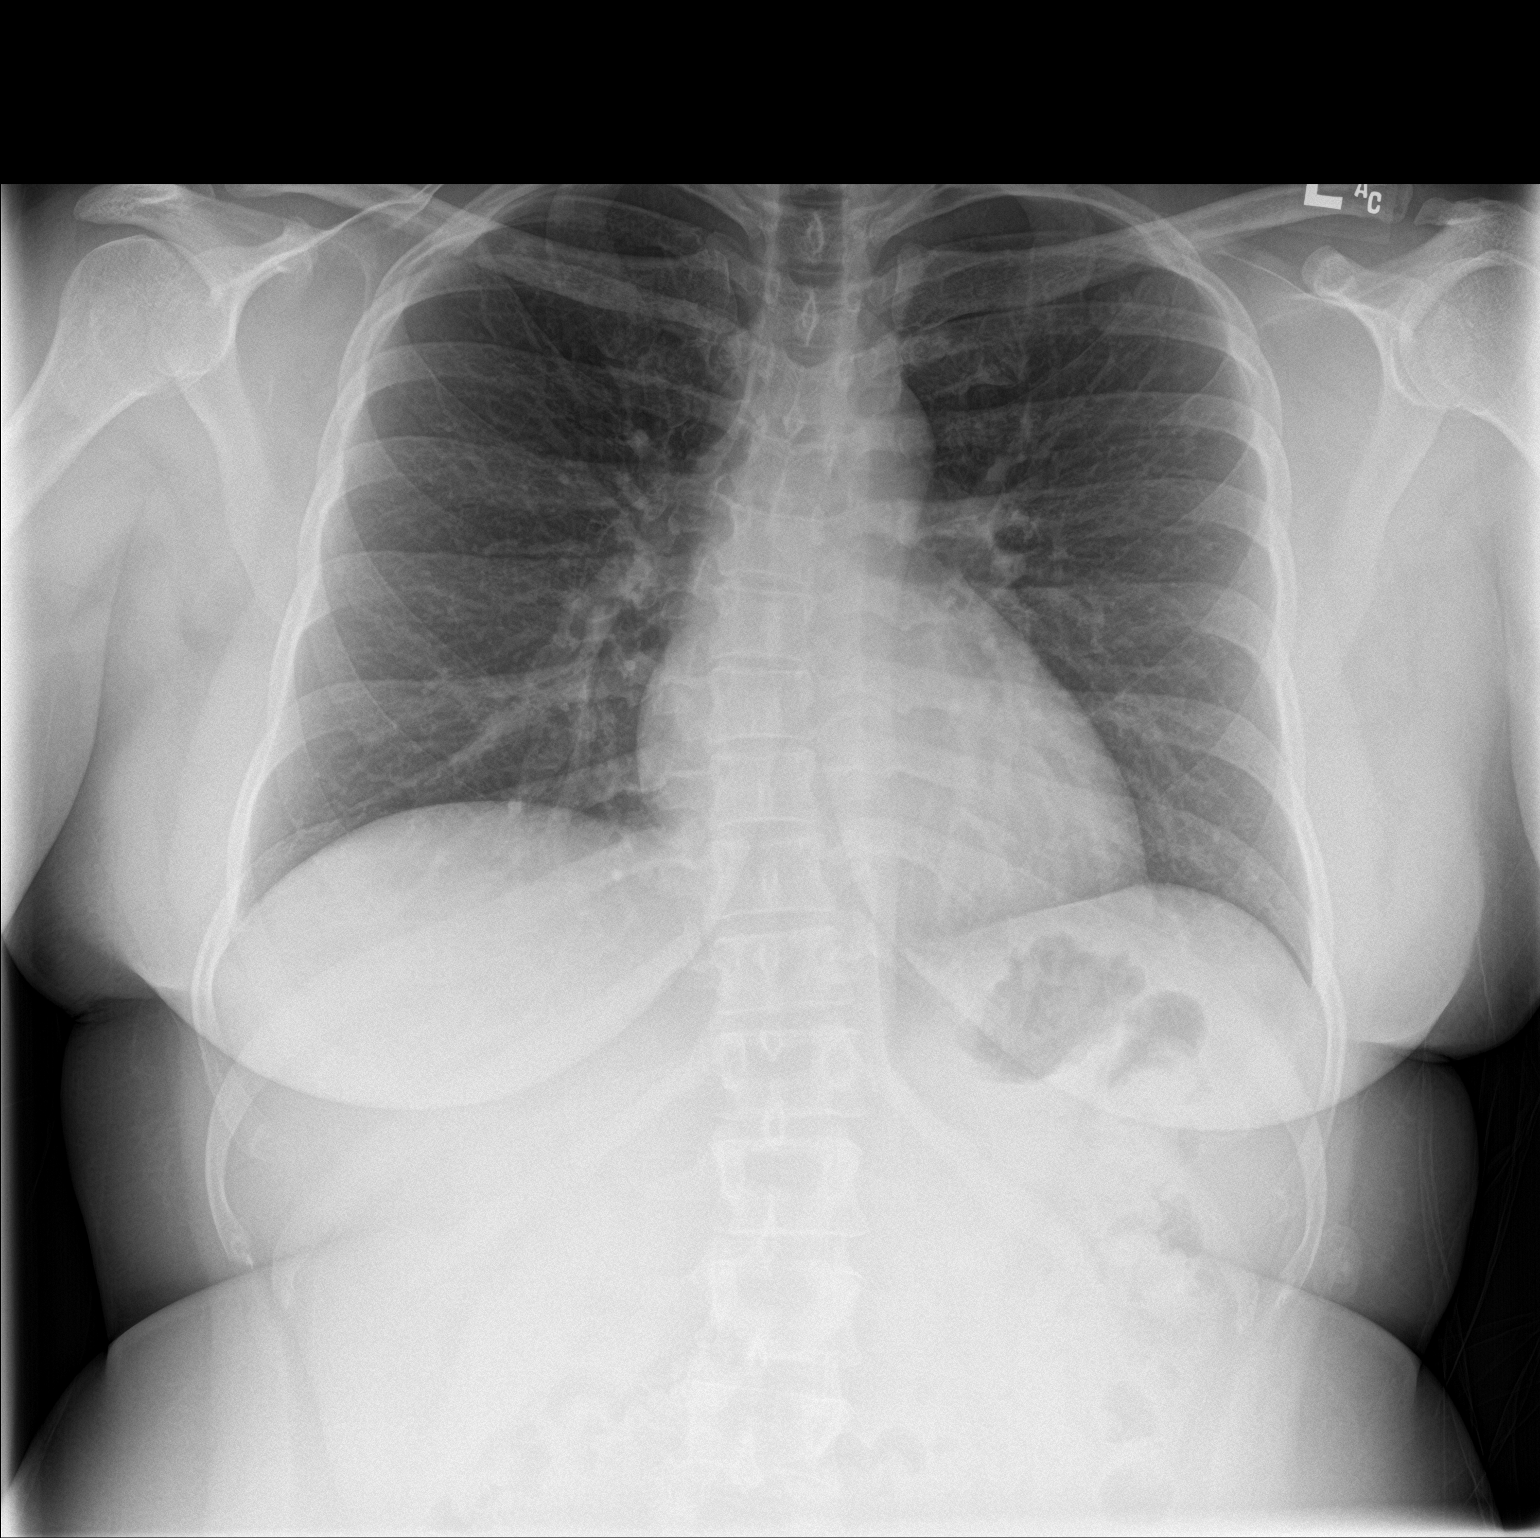

[chest lat]
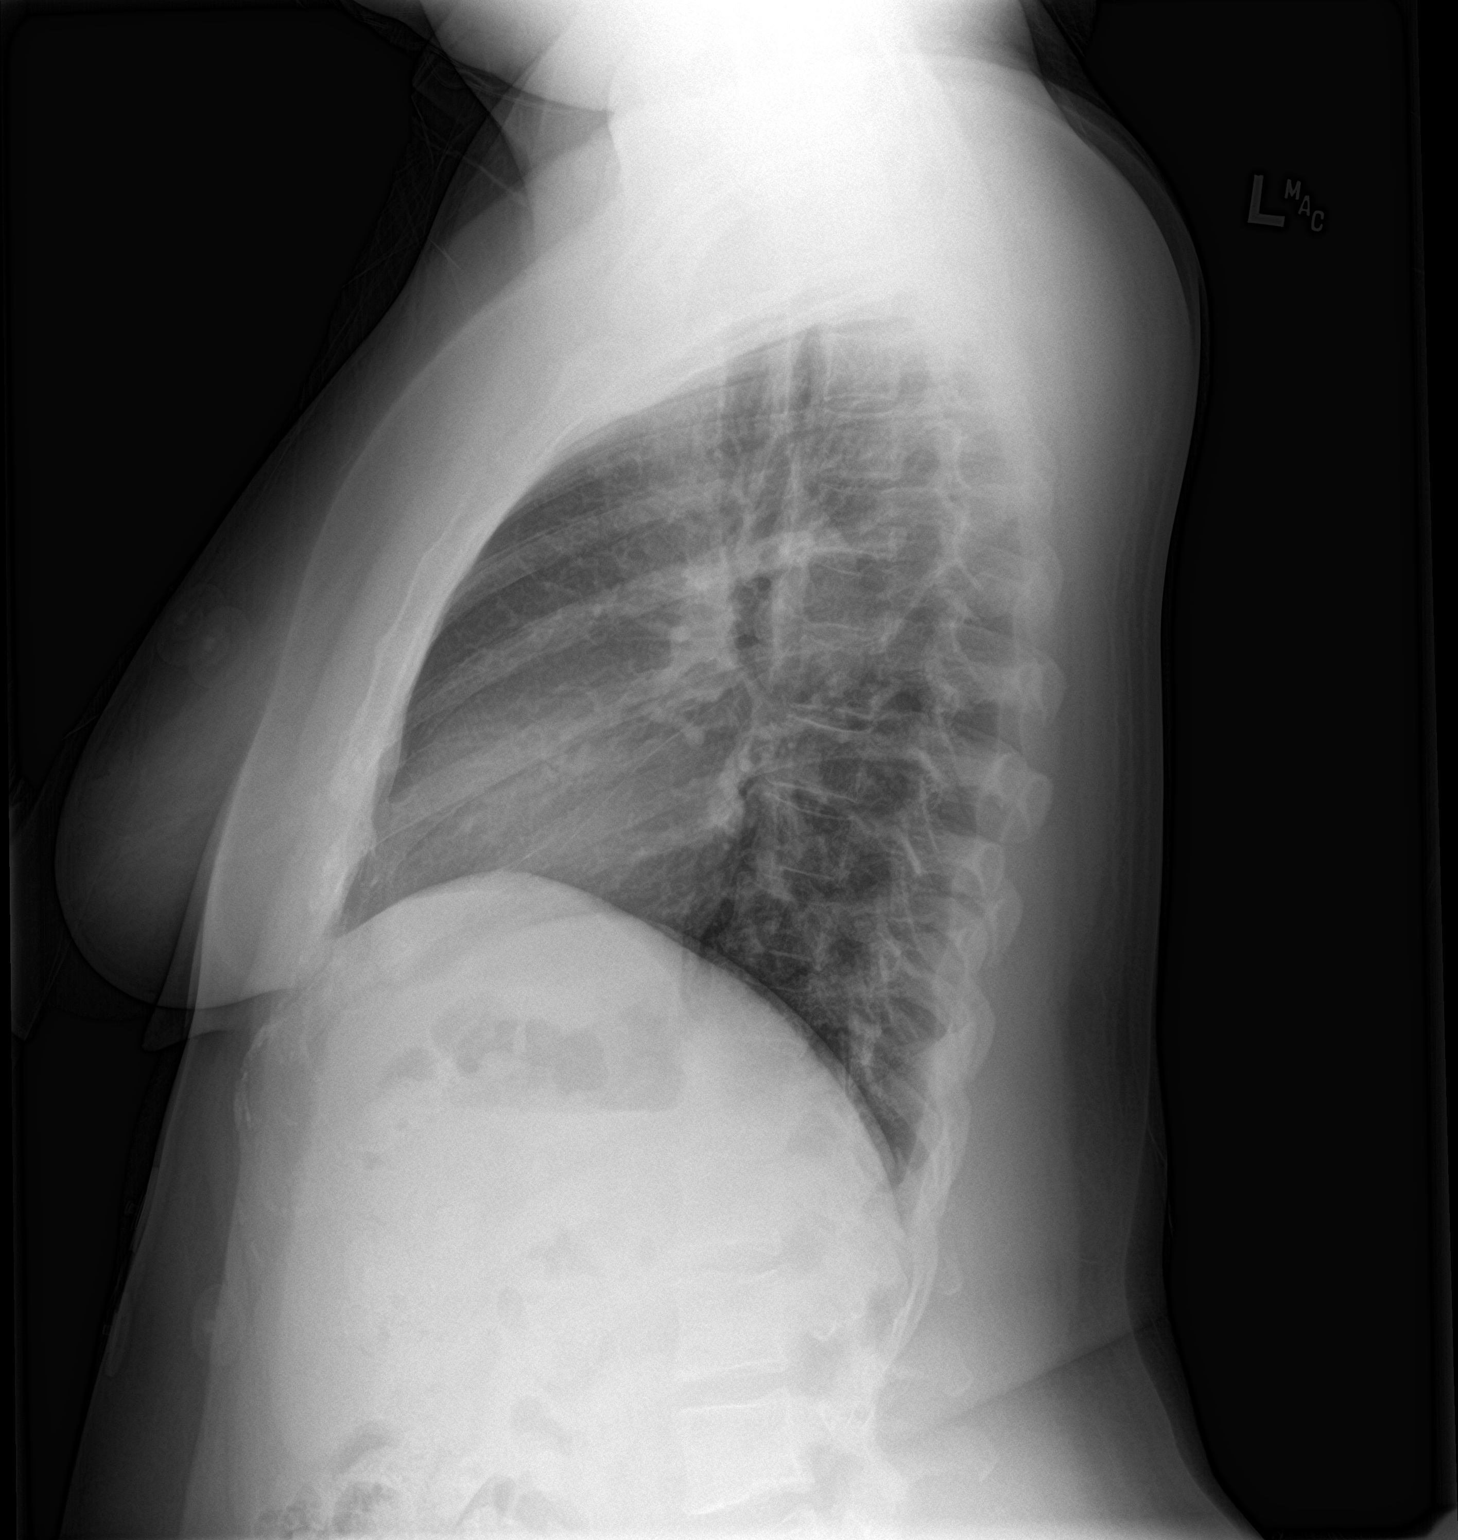

[2 of 2 positions shown; findings below may reference images not displayed]

FINDINGS: Normal heart size and mediastinal contours. Artifact from EKG leads.
No acute infiltrate or edema. No effusion or pneumothorax. No acute
osseous findings. Mild thoracic dextroscoliosis
IMPRESSION: No active cardiopulmonary disease.

## 2019-11-11 MED ORDER — ALBUTEROL SULFATE HFA 108 (90 BASE) MCG/ACT IN AERS
2.0000 | INHALATION_SPRAY | RESPIRATORY_TRACT | Status: DC | PRN
  Administered 2019-11-11: 2 via RESPIRATORY_TRACT

## 2019-11-11 MED ORDER — PANTOPRAZOLE SODIUM 40 MG PO TBEC
40.0000 mg | DELAYED_RELEASE_TABLET | Freq: Once | ORAL | Status: AC
  Administered 2019-11-11: 06:00:00 40 mg via ORAL
  Filled 2019-11-11: qty 1

## 2019-11-11 MED ORDER — ALBUTEROL SULFATE HFA 108 (90 BASE) MCG/ACT IN AERS
INHALATION_SPRAY | RESPIRATORY_TRACT | Status: AC
  Filled 2019-11-11: qty 6.7

## 2019-11-11 NOTE — ED Provider Notes (Signed)
[facility] Provider Note: [name], MD, FACEP  CSN: [id number] MRN: [id number] ARRIVAL: 11/11/19 at 0504 ROOM: [facility]04/[facility]04   CHIEF COMPLAINT  Chest Pain and Shortness of Breath   HISTORY OF PRESENT ILLNESS  11/11/19 5:21 AM [name] [name] is a 33 y.o. female with chest discomfort and shortness of breath that began 3 days ago.  She describes the discomfort in her chest as congestion or pressure and rates it as a 3 out of 10.  It is located in the mid upper chest but sometimes radiates to the left arm.  She denies nasal congestion or fever but has a sore or tight throat when she wakes up in the morning; this morning she coughed up some phlegm.  She has had no recent sick contacts.  She just returned to work about a week ago after maternity leave (vaginal birth).  Her shortness of breath was worse this morning so she decided to be evaluated.   Past Medical History:  Diagnosis Date  . Abnormal Pap smear of cervix     teenager,  repeat pap smear  . Gestational diabetes   . Headache(784.0)    tension only  . Heart murmur    no treatment for as child    Past Surgical History:  Procedure Laterality Date  . BUNIONECTOMY  2005  . DILATION AND CURETTAGE OF UTERUS      Family History  Problem Relation Age of Onset  . Hypertension Mother   . Thyroid disease Mother   . Hypertension Maternal Grandmother     Social History   Tobacco Use  . Smoking status: Never Smoker  . Smokeless tobacco: Never Used  Substance Use Topics  . Alcohol use: Yes    Alcohol/week: 0.0 standard drinks    Comment: Rare  . Drug use: No    Prior to Admission medications   Medication Sig Start Date End Date Taking? Authorizing Provider  acetaminophen (TYLENOL) 325 MG tablet Take 2 tablets (650 mg total) by mouth every 4 (four) hours as needed (for pain scale < 4). 08/23/19   Neta Mends, CNM  benzocaine-Menthol (DERMOPLAST) 20-0.5 % AERO Apply 1 application topically as needed for  irritation (perineal discomfort). 08/23/19   Neta Mends, CNM  calcium carbonate (TUMS - DOSED IN MG ELEMENTAL CALCIUM) 500 MG chewable tablet Chew 1 tablet by mouth as needed for indigestion or heartburn.    [provider]  coconut oil OIL Apply 1 application topically as needed. 08/23/19   Neta Mends, CNM  Multiple Vitamins-Minerals (ALIVE ONCE DAILY WOMENS PO) Take 1 tablet by mouth daily.    [provider]    Allergies Patient has no known allergies.   REVIEW OF SYSTEMS  Negative except as noted here or in the History of Present Illness.   PHYSICAL EXAMINATION  Initial Vital Signs Blood pressure 114/73, pulse 70, temperature 98.4 F (36.9 C), temperature source Oral, resp. rate 18, height 5\' 1"  (1.549 m), weight 80.7 kg, SpO2 100 %, unknown if currently breastfeeding.  Examination General: Well-developed, well-nourished female in no acute distress; appearance consistent with age of record HENT: normocephalic; atraumatic; no stridor; no pharyngeal erythema or exudate; no dysphonia Eyes: pupils equal, round and reactive to light; extraocular muscles intact Neck: supple Heart: regular rate and rhythm Lungs: Clear to auscultation bilaterally Abdomen: soft; nondistended; nontender; bowel sounds present Extremities: No deformity; full range of motion; pulses normal; no edema  Neurologic: Awake, alert and oriented; motor function intact  in all extremities and symmetric; no facial droop Skin: Warm and dry Psychiatric: Normal mood and affect   RESULTS  Summary of this visit's results, reviewed and interpreted by myself:   EKG Interpretation  Date/Time:  Wednesday November 11 2019 05:34:11 EST Ventricular Rate:  65 PR Interval:    QRS Duration: 82 QT Interval:  400 QTC Calculation: 416 R Axis:   72 Text Interpretation: Sinus rhythm Normal ECG No significant change was found Confirmed by Shanon Rosser [phone]) on 11/11/2019 5:35:55 AM      Laboratory  Studies: No results found for this or any previous visit (from the past 24 hour(s)). Imaging Studies: Dg Neck Soft Tissue  Result Date: 11/11/2019 CLINICAL DATA:  Chest pain radiating to the left arm for 3 days. Anterior neck congestion EXAM: NECK SOFT TISSUES - 1+ VIEW COMPARISON:  None. FINDINGS: There is no evidence of retropharyngeal soft tissue swelling or epiglottic enlargement. The cervical airway is unremarkable and no radio-opaque foreign body identified. IMPRESSION: Negative. Electronically Signed   By: Monte Fantasia M.D.   On: 11/11/2019 06:06   Cxr Pa/lat  Result Date: 11/11/2019 CLINICAL DATA:  Shortness of breath with chest pain radiating to the left arm for 3 days EXAM: CHEST - 2 VIEW COMPARISON:  09/09/2016 FINDINGS: Normal heart size and mediastinal contours. Artifact from EKG leads. No acute infiltrate or edema. No effusion or pneumothorax. No acute osseous findings. Mild thoracic dextroscoliosis IMPRESSION: No active cardiopulmonary disease. Electronically Signed   By: Monte Fantasia M.D.   On: 11/11/2019 06:05    ED COURSE and MDM  Nursing notes, initial and subsequent vitals signs, including pulse oximetry, reviewed and interpreted by myself.  Vitals:   11/11/19 0518 11/11/19 0551 11/11/19 0612 11/11/19 0619  BP: 114/73  98/65   Pulse: 70 75 60   Resp: 18 12 15    Temp: 98.4 F (36.9 C)     TempSrc: Oral     SpO2: 100% 100% 100% 100%  Weight: 80.7 kg     Height: 5\' 1"  (1.549 m)      Medications  albuterol (VENTOLIN HFA) 108 (90 Base) MCG/ACT inhaler 2 puff (2 puffs Inhalation Given 11/11/19 0615)  pantoprazole (PROTONIX) EC tablet 40 mg (40 mg Oral Given 11/11/19 0612)    6:24 AM Some subjective improvement after albuterol inhaler treatment with increased air movement heard on exam.  Patient denies frank pain at the present time with the exception of some mild sternal pain (and tenderness).  Vital signs are within normal limits.  I have a low clinical suspicion  of PE and she is PERC/Wells negative.  D-dimer would likely be a false positive given recent pregnancy.  Symptoms could also be indicative of GERD and she was given a dose of Protonix in the ED.  She has no history of GERD.  She was advised to return for worsening symptoms.  PROCEDURES  Procedures   ED DIAGNOSES     ICD-10-CM   1. Shortness of breath  R06.02   2. Throat tightness  R68.89        Shanon Rosser, MD 11/11/19 [phone]

## 2019-11-11 NOTE — ED Triage Notes (Signed)
  Patient comes in with chest pain and SOB that started about 3 days ago.  Patient states she feels like she has congestion in her chest and it radiates to L arm.  No cough or fever.  Has not been around anyone sick that she knows of.  Patient just returned to work after maternity leave.  Patient states SOB got a little worse this morning and decided to come in.  Vitals stable and patient A&O x4.

## 2020-06-02 ENCOUNTER — Other Ambulatory Visit: Payer: Self-pay | Admitting: Podiatry

## 2020-06-02 ENCOUNTER — Other Ambulatory Visit: Payer: Self-pay

## 2020-06-02 ENCOUNTER — Encounter: Payer: Self-pay | Admitting: Podiatry

## 2020-06-02 ENCOUNTER — Ambulatory Visit (INDEPENDENT_AMBULATORY_CARE_PROVIDER_SITE_OTHER): Payer: 59

## 2020-06-02 ENCOUNTER — Ambulatory Visit (INDEPENDENT_AMBULATORY_CARE_PROVIDER_SITE_OTHER): Payer: 59 | Admitting: Podiatry

## 2020-06-02 VITALS — Temp 98.0°F

## 2020-06-02 DIAGNOSIS — M778 Other enthesopathies, not elsewhere classified: Secondary | ICD-10-CM

## 2020-06-02 DIAGNOSIS — M79671 Pain in right foot: Secondary | ICD-10-CM

## 2020-06-02 DIAGNOSIS — M76821 Posterior tibial tendinitis, right leg: Secondary | ICD-10-CM | POA: Diagnosis not present

## 2020-06-02 DIAGNOSIS — M79672 Pain in left foot: Secondary | ICD-10-CM

## 2020-06-03 NOTE — Progress Notes (Signed)
Subjective:   Patient ID: [name] [name], female   DOB: 34 y.o.   MRN: [id number]   HPI Patient states over the last month she started develop pain in both her feet pointing to the inside of the right foot and the top of the left foot.  States what we have done several years ago really helped her and this is been more recent   ROS      Objective:  Physical Exam  Neurovascular status intact with patient having increased her activity levels over the last couple months and is found to have inflammation pain of the posterior tibial insertion right navicular and on the left is found to have dorsal inflammation around the midtarsal joint with pain with no pain noted dorsal right currently     Assessment:  Probability for moderate posterior tibial tendinitis right along with extensor tendinitis left with increase in activity levels and different types of exercising possibly contributory     Plan:  H&P x-rays reviewed and today I did sterile prep and injected posterior tib at its insertion after explaining risk 3 mg dexamethasone Kenalog 5 mg Xylocaine and I then went ahead and for the left I did do extensor tendon injection 3 mg Dexasone Kenalog 5 mg Xylocaine advised on reduced activity heat ice therapy and placed on oral diclofenac.  Reappoint to recheck and advised on supportive shoes  X-rays were negative for signs of fracture did not indicate arthritis or significant loss of range of motion

## 2020-06-20 ENCOUNTER — Ambulatory Visit: Payer: 59 | Admitting: Podiatry

## 2020-09-12 ENCOUNTER — Ambulatory Visit: Payer: 59 | Admitting: Podiatry

## 2020-09-27 ENCOUNTER — Encounter (HOSPITAL_BASED_OUTPATIENT_CLINIC_OR_DEPARTMENT_OTHER): Payer: Self-pay | Admitting: *Deleted

## 2020-09-27 ENCOUNTER — Emergency Department (HOSPITAL_BASED_OUTPATIENT_CLINIC_OR_DEPARTMENT_OTHER): Payer: Managed Care, Other (non HMO)

## 2020-09-27 ENCOUNTER — Other Ambulatory Visit: Payer: Self-pay

## 2020-09-27 ENCOUNTER — Emergency Department (HOSPITAL_BASED_OUTPATIENT_CLINIC_OR_DEPARTMENT_OTHER)
Admission: EM | Admit: 2020-09-27 | Discharge: 2020-09-27 | Disposition: A | Discharge status: Home / Self Care | Payer: Managed Care, Other (non HMO) | Attending: Emergency Medicine | Admitting: Emergency Medicine

## 2020-09-27 DIAGNOSIS — S99911A Unspecified injury of right ankle, initial encounter: Secondary | ICD-10-CM | POA: Diagnosis present

## 2020-09-27 DIAGNOSIS — W19XXXA Unspecified fall, initial encounter: Secondary | ICD-10-CM | POA: Insufficient documentation

## 2020-09-27 DIAGNOSIS — S93431A Sprain of tibiofibular ligament of right ankle, initial encounter: Secondary | ICD-10-CM | POA: Insufficient documentation

## 2020-09-27 DIAGNOSIS — S93491A Sprain of other ligament of right ankle, initial encounter: Secondary | ICD-10-CM

## 2020-09-27 LAB — PREGNANCY, URINE: Preg Test, Ur: NEGATIVE

## 2020-09-27 IMAGING — CR DG ANKLE COMPLETE 3+V*R*
3 series · 3 of 3 positions shown · non-contrast
Comparison: [PN]

CLINICAL DATA: Lateral ankle pain after stepping in hole

EXAM:
RIGHT ANKLE - COMPLETE 3+ VIEW

[t ankle joint ap right]
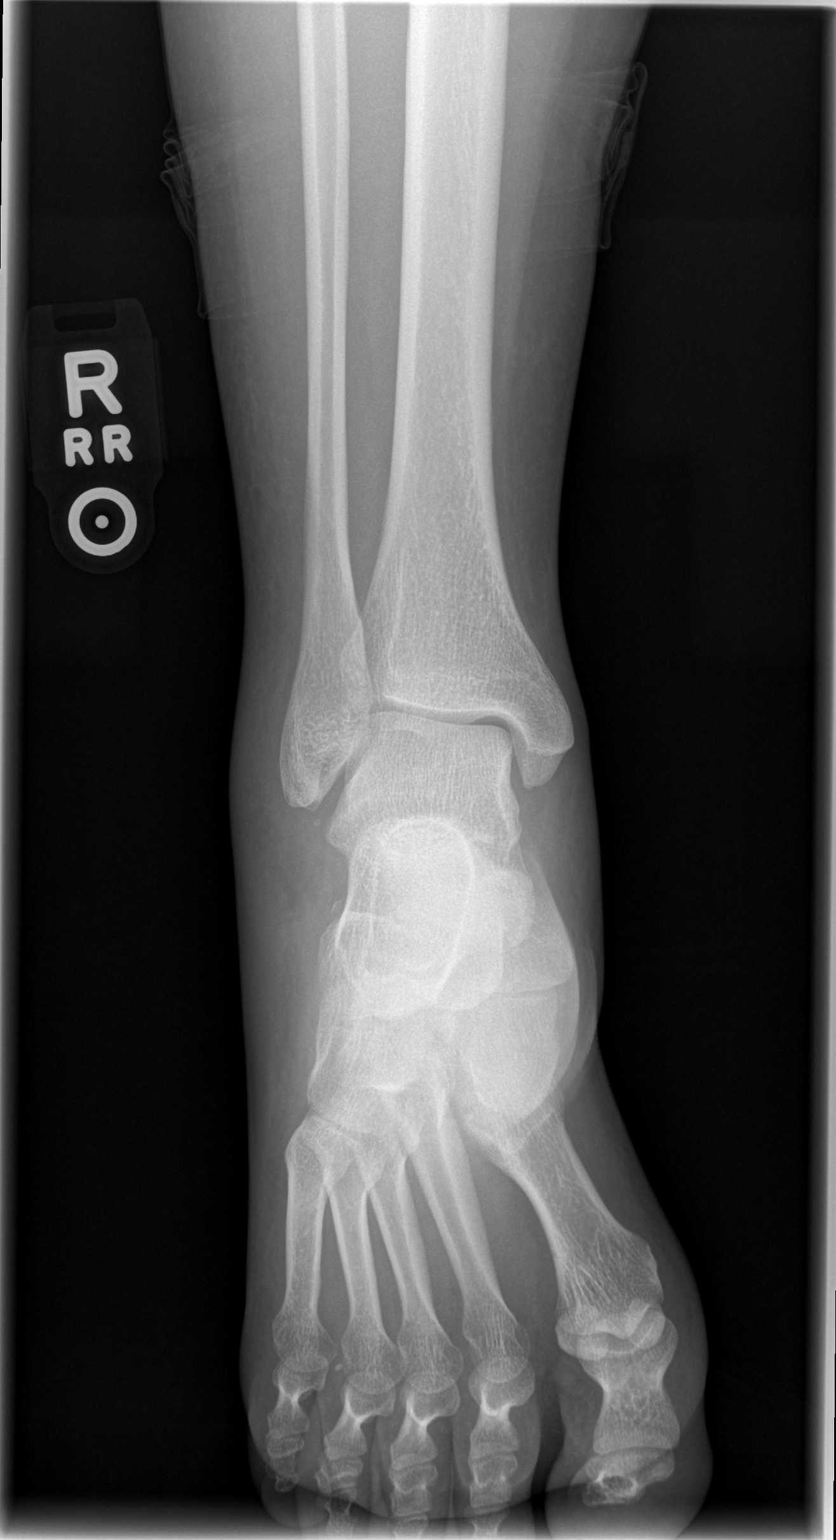

[t ankle joint oblique right]
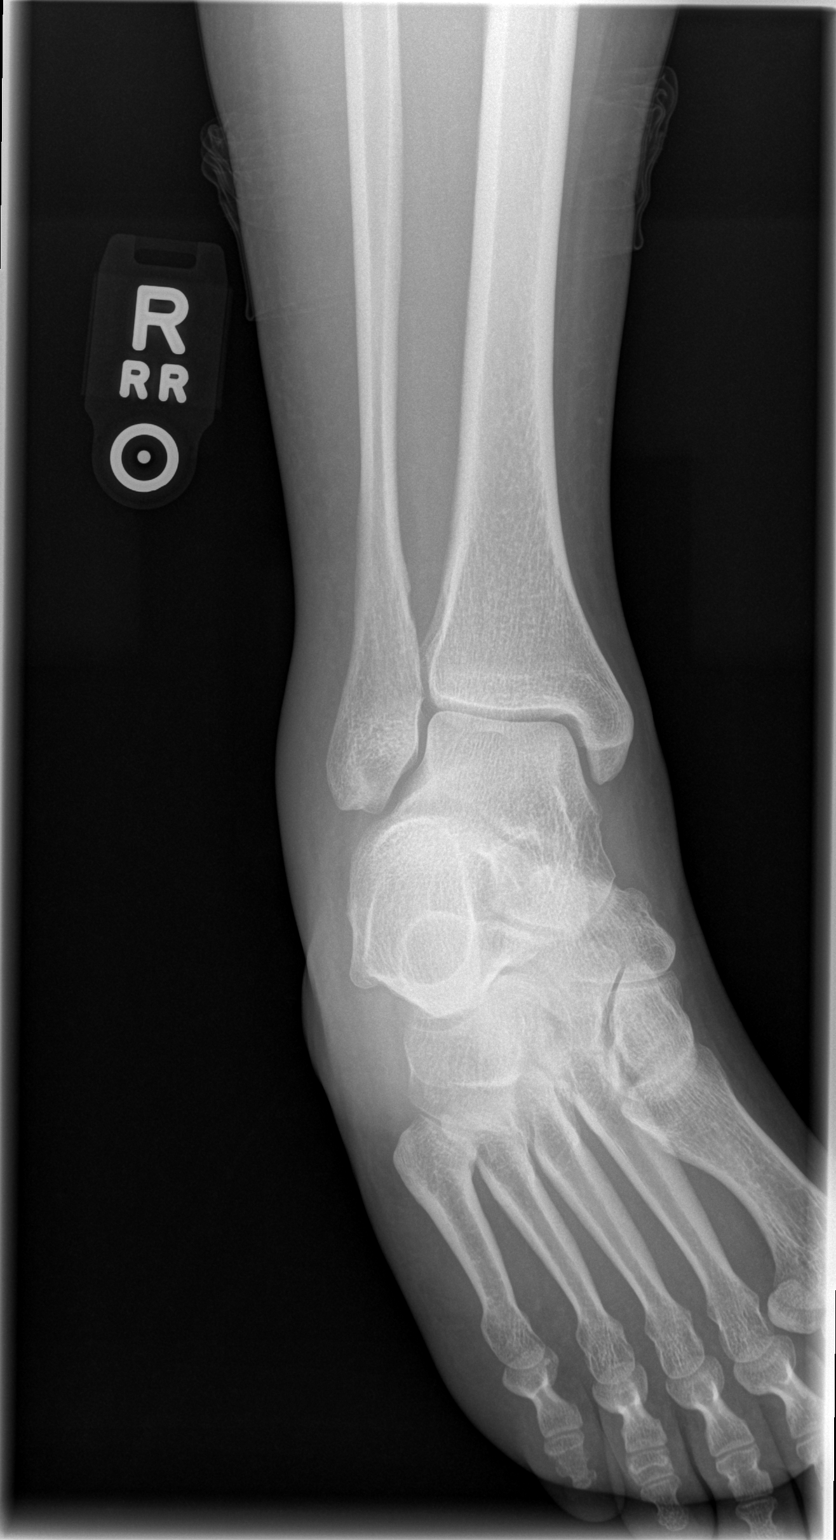

[t ankle joint lat right]
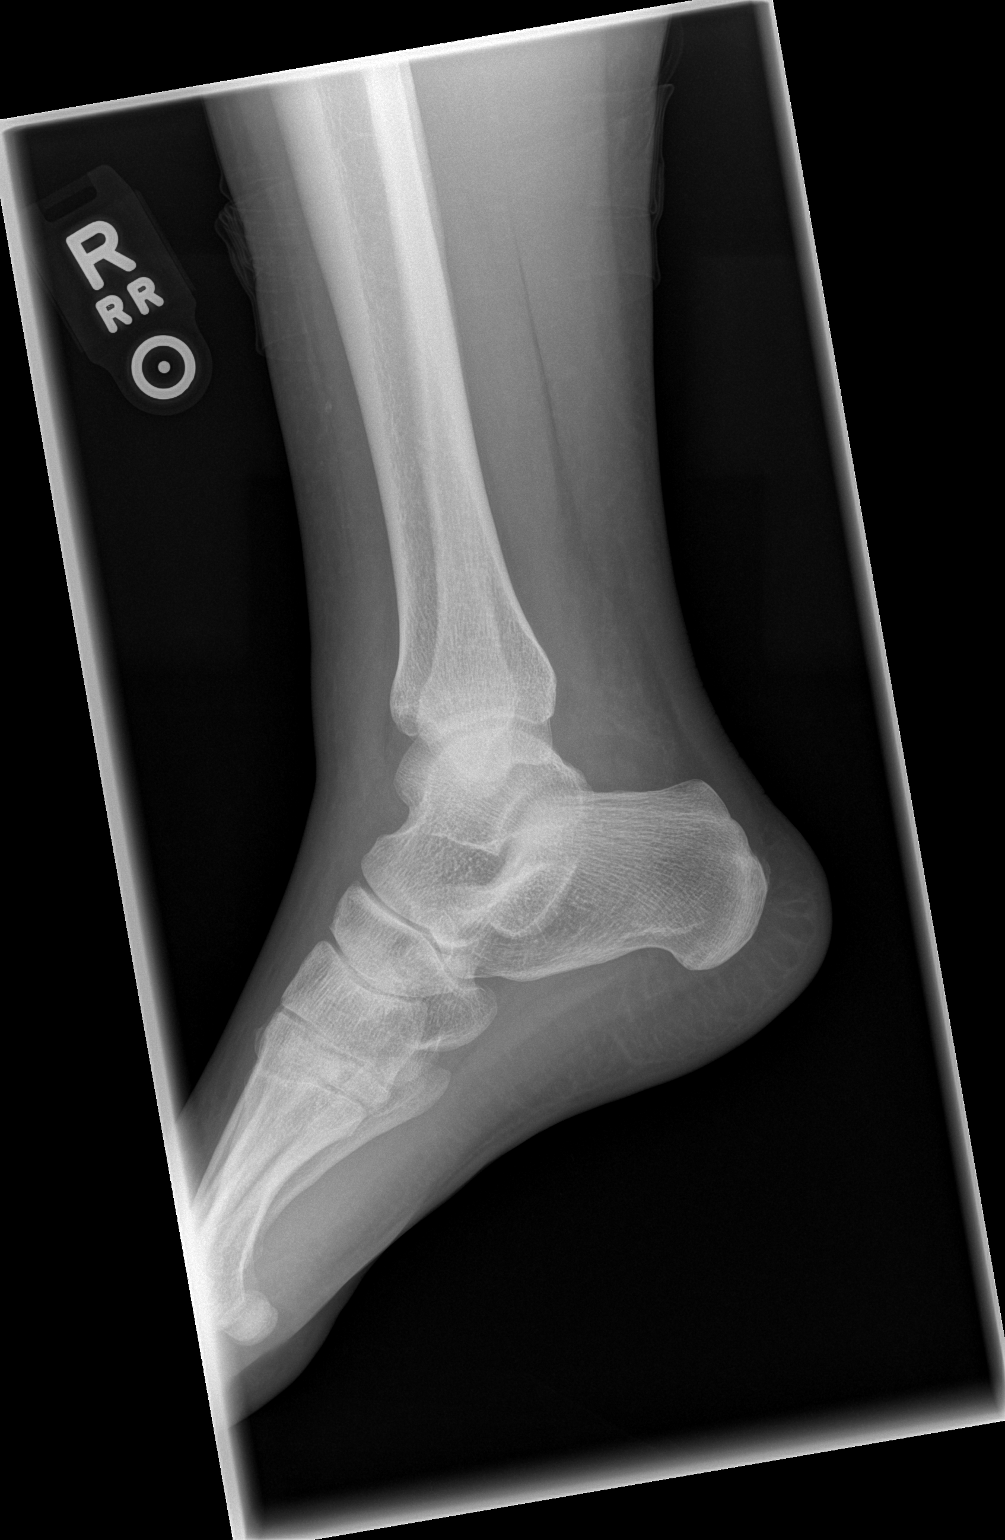

[3 of 3 positions shown; findings below may reference images not displayed]

FINDINGS: There is soft tissue swelling at the lateral malleolus. Alignment is
anatomic. There is a small bony fragment seen only on the AP frontal
view inferior to the talus and lateral to the calcaneus. Small acute
avulsion fracture suspected. Joint spaces are preserved.
IMPRESSION: Lateral ankle soft tissue swelling. Suspect small acute avulsion
fracture laterally from calcaneus or cuboid seen only on frontal
view.

## 2020-09-27 NOTE — Discharge Instructions (Signed)
Contact a health care provider if: °You have rapidly increasing bruising or swelling. °Your pain is not relieved with medicine. °Get help right away if: °Your foot or toes become numb or blue. °You have severe pain that gets worse. °

## 2020-09-27 NOTE — ED Triage Notes (Signed)
Right ankle pain from the fall this past Sunday.

## 2020-09-27 NOTE — ED Provider Notes (Signed)
[facility] EMERGENCY DEPARTMENT Provider Note   CSN: [id number] Arrival date & time: 09/27/20  5784     History Chief Complaint  Patient presents with  . Fall    [name] [name] is a 34 y.o. female  S: [name] [name] is a 34 y.o. female who complains of inversion injury to the right ankle 2 days ago. There is pain and swelling at the lateral aspect of that ankle. The patient was able to bear weight directly after the injury. She has tried icing, elevating, and toe touch weight bearing along with ibuprofen at home without much relief. She has a hx of about 4 previous left ankle sprains. Marland Kitchen   HPI     Past Medical History:  Diagnosis Date  . Abnormal Pap smear of cervix     teenager,  repeat pap smear  . Gestational diabetes   . Headache(784.0)    tension only  . Heart murmur    no treatment for as child    Patient Active Problem List   Diagnosis Date Noted  . SVD (9/18) 08/22/2019  . First degree perineal laceration 08/22/2019  . Gestational diabetes mellitus in pregnancy, diet controlled 08/22/2019  . Adjustment disorder with anxiety 07/08/2017  . Chest pain 03/05/2016  . Murmur 03/05/2016  . Palpitations 02/19/2016  . Unspecified constipation 07/21/2014  . Headache(784.0) 11/16/2013    Past Surgical History:  Procedure Laterality Date  . BUNIONECTOMY  2005  . DILATION AND CURETTAGE OF UTERUS       OB History    Gravida  4   Para  2   Term  2   Preterm      AB  1   Living  2     SAB  1   TAB      Ectopic      Multiple  0   Live Births  2           Family History  Problem Relation Age of Onset  . Hypertension Mother   . Thyroid disease Mother   . Hypertension Maternal Grandmother     Social History   Tobacco Use  . Smoking status: Never Smoker  . Smokeless tobacco: Never Used  Vaping Use  . Vaping Use: Never used  Substance Use Topics  . Alcohol use: Yes    Alcohol/week: 0.0 standard drinks    Comment: Rare  . Drug  use: No    Home Medications Prior to Admission medications   Medication Sig Start Date End Date Taking? Authorizing Provider  Multiple Vitamins-Minerals (ALIVE ONCE DAILY WOMENS PO) Take 1 tablet by mouth daily.    [provider]    Allergies    Patient has no known allergies.  Review of Systems   Review of Systems  Musculoskeletal: Positive for gait problem and joint swelling.  Skin: Negative for wound.  Neurological: Negative for dizziness, weakness, light-headedness and numbness.    Physical Exam Updated Vital Signs BP 131/81 (BP Location: Right Arm)   Pulse 76   Temp 97.9 F (36.6 C) (Oral)   Resp 16   Ht 5\' 1"  (1.549 m)   Wt 88.9 kg   LMP 09/25/2020   SpO2 100%   BMI 37.03 kg/m   Physical Exam Vitals and nursing note reviewed.  Constitutional:      General: She is not in acute distress.    Appearance: She is well-developed. She is not diaphoretic.  HENT:     Head: Normocephalic  and atraumatic.  Eyes:     General: No scleral icterus.    Conjunctiva/sclera: Conjunctivae normal.  Cardiovascular:     Rate and Rhythm: Normal rate and regular rhythm.     Heart sounds: Normal heart sounds. No murmur heard.  No friction rub. No gallop.   Pulmonary:     Effort: Pulmonary effort is normal. No respiratory distress.     Breath sounds: Normal breath sounds.  Abdominal:     General: Bowel sounds are normal. There is no distension.     Palpations: Abdomen is soft. There is no mass.     Tenderness: There is no abdominal tenderness. There is no guarding.  Musculoskeletal:     Cervical back: Normal range of motion.     Comments: Exam of the injured ankle reveals swelling and tenderness over the lateral malleolus. No tenderness over the medial aspect of the ankle. The fifth metatarsal is not tender. The ankle joint is intact without excessive opening on stressing.   Skin:    General: Skin is warm and dry.  Neurological:     Mental Status: She is alert and  oriented to person, place, and time.  Psychiatric:        Behavior: Behavior normal.     ED Results / Procedures / Treatments   Labs (all labs ordered are listed, but only abnormal results are displayed) Labs Reviewed  PREGNANCY, URINE    EKG None  Radiology DG Ankle Complete Right  Result Date: 09/27/2020 CLINICAL DATA:  Lateral ankle pain after stepping in hole EXAM: RIGHT ANKLE - COMPLETE 3+ VIEW COMPARISON:  2018 FINDINGS: There is soft tissue swelling at the lateral malleolus. Alignment is anatomic. There is a small bony fragment seen only on the AP frontal view inferior to the talus and lateral to the calcaneus. Small acute avulsion fracture suspected. Joint spaces are preserved. IMPRESSION: Lateral ankle soft tissue swelling. Suspect small acute avulsion fracture laterally from calcaneus or cuboid seen only on frontal view. Electronically Signed   By: Guadlupe Spanish M.D.   On: 09/27/2020 10:23    Procedures Procedures (including critical care time)  Medications Ordered in ED Medications - No data to display  ED Course  I have reviewed the triage vital signs and the nursing notes.  Pertinent labs & imaging results that were available during my care of the patient were reviewed by me and considered in my medical decision making (see chart for details).  Clinical Course as of Sep 28 1043  Tue Sep 27, 2020  1012 DG Ankle Complete Right [KK]    Clinical Course User Index [KK] Janene Harvey Sherilyn Dacosta   MDM Rules/Calculators/A&P                          Patient X-Ray negative for dislocation, questionable tiny avulsion fracture . Pain managed in ED.   Home Care: Rest and elevate the injured ankle, apply ice intermittently. Use crutches without weight bearing until able to comfortable bear partial weight, then progress to full weight bearing as tolerated. Splint applied. See ortho prn.  Patient will be dc home & is agreeable with above plan.  Final Clinical  Impression(s) / ED Diagnoses Final diagnoses:  Sprain of anterior talofibular ligament of right ankle, initial encounter    Rx / DC Orders ED Discharge Orders    None       Arthor Captain, PA-C 09/27/20 1044    Melene Plan, DO 09/27/20 1047

## 2020-09-28 ENCOUNTER — Ambulatory Visit (INDEPENDENT_AMBULATORY_CARE_PROVIDER_SITE_OTHER): Payer: Managed Care, Other (non HMO) | Admitting: Family Medicine

## 2020-09-28 ENCOUNTER — Encounter: Payer: Self-pay | Admitting: Family Medicine

## 2020-09-28 VITALS — BP 131/81 | HR 105 | Ht 61.0 in | Wt 196.0 lb

## 2020-09-28 DIAGNOSIS — M7670 Peroneal tendinitis, unspecified leg: Secondary | ICD-10-CM | POA: Insufficient documentation

## 2020-09-28 DIAGNOSIS — S93491A Sprain of other ligament of right ankle, initial encounter: Secondary | ICD-10-CM | POA: Diagnosis not present

## 2020-09-28 MED ORDER — IBUPROFEN 800 MG PO TABS: 800.0000 mg | ORAL_TABLET | Freq: Three times a day (TID) | ORAL | 0 refills | Status: DC | PRN

## 2020-09-28 NOTE — Progress Notes (Signed)
[name] [name] - 34 y.o. female MRN [id number]  Date of birth: October 23, 1986  SUBJECTIVE:  Including CC & ROS.  Chief Complaint  Patient presents with  . Ankle Injury    right x 09-25-2020    [name] [name] is a 34 y.o. female that is presenting with right ankle pain.  She had an inversion injury yesterday.  Since that time she said swelling.  She was placed in a cam walker.  No history of surgery.  Has had a history of repeated ankle injuries..  Independent review of the right ankle x-ray from 10/26 shows possible avulsion fracture of the cuboid.   Review of Systems See HPI   HISTORY: Past Medical, Surgical, Social, and Family History Reviewed & Updated per EMR.   Pertinent Historical Findings include:  Past Medical History:  Diagnosis Date  . Abnormal Pap smear of cervix     teenager,  repeat pap smear  . Gestational diabetes   . Headache(784.0)    tension only  . Heart murmur    no treatment for as child    Past Surgical History:  Procedure Laterality Date  . BUNIONECTOMY  2005  . DILATION AND CURETTAGE OF UTERUS      Family History  Problem Relation Age of Onset  . Hypertension Mother   . Thyroid disease Mother   . Hypertension Maternal Grandmother     Social History   Socioeconomic History  . Marital status: Single    Spouse name: n/a  . Number of children: 1  . Years of education: 12th  . Highest education level: Not on file  Occupational History  . Occupation: Engineer, mining    Comment: [employer]  . 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 Occupation: LAB [employer]ST    Employer: [employer]  Tobacco Use  . Smoking status: Never Smoker  . Smokeless tobacco: Never Used  Vaping Use  . Vaping Use: Never used  Substance and Sexual Activity  . Alcohol use: Yes    Alcohol/week: 0.0 standard drinks    Comment: Rare  . Drug use: No  . Sexual activity: Yes    Partners: Male    Birth control/protection: Abstinence  Other Topics  Concern  . Not on file  Social History Narrative  . Not on file   Social Determinants of Health   Financial Resource Strain:   . Difficulty of Paying Living Expenses: Not on file  Food Insecurity:   . Worried About Programme researcher, broadcasting/film/video in the Last Year: Not on file  . Ran Out of Food in the Last Year: Not on file  Transportation Needs:   . Lack of Transportation (Medical): Not on file  . Lack of Transportation (Non-Medical): Not on file  Physical Activity:   . Days of Exercise per Week: Not on file  . Minutes of Exercise per Session: Not on file  Stress:   . Feeling of Stress : Not on file  Social Connections:   . Frequency of Communication with Friends and Family: Not on file  . Frequency of Social Gatherings with Friends and Family: Not on file  . Attends Religious Services: Not on file  . Active Member of Clubs or Organizations: Not on file  . Attends Banker Meetings: Not on file  . Marital Status: Not on file  Intimate Partner Violence:   . Fear of Current or Ex-Partner: Not on file  . Emotionally  Abused: Not on file  . Physically Abused: Not on file  . Sexually Abused: Not on file     PHYSICAL EXAM:  VS: BP 131/81   Pulse (!) 105   Ht 5\' 1"  (1.549 m)   Wt 196 lb (88.9 kg)   LMP 09/25/2020   BMI 37.03 kg/m  Physical Exam Gen: NAD, alert, cooperative with exam, well-appearing MSK:  Right ankle: Significant edema over the lateral malleolus as well as the dorsum of the midfoot. Limited range of motion and plantar flexion dorsiflexion. Tenderness palpation over the distal fibula. Tenderness palpation of the cuboid. Neurovascular intact     ASSESSMENT & PLAN:   Sprain of anterior talofibular ligament of right ankle Injury on 10/26.  She does have a lot of swelling over the dorsum of the foot.  Has tenderness over the ATFL and tenderness over the cuboid.  Possible for fracture. -Counseled on home exercise therapy and supportive care. -Counseled  on CAM Walker. -Could consider further imaging or physical therapy.

## 2020-09-28 NOTE — Assessment & Plan Note (Signed)
Injury on 10/26.  She does have a lot of swelling over the dorsum of the foot.  Has tenderness over the ATFL and tenderness over the cuboid.  Possible for fracture. -Counseled on home exercise therapy and supportive care. -Counseled on CAM Walker. -Could consider further imaging or physical therapy.

## 2020-09-28 NOTE — Patient Instructions (Signed)
Nice to meet you Please ice and elevate  Please use ibuprofen as needed Please do range of motion movements   Please send me a message in MyChart with any questions or updates.  Please see me back in 2 weeks.   --Dr. Jordan Likes

## 2020-10-17 ENCOUNTER — Ambulatory Visit: Payer: Managed Care, Other (non HMO) | Admitting: Family Medicine

## 2020-10-24 ENCOUNTER — Encounter: Payer: Self-pay | Admitting: Family Medicine

## 2020-10-24 ENCOUNTER — Other Ambulatory Visit: Payer: Self-pay

## 2020-10-24 ENCOUNTER — Ambulatory Visit (INDEPENDENT_AMBULATORY_CARE_PROVIDER_SITE_OTHER): Payer: Managed Care, Other (non HMO) | Admitting: Family Medicine

## 2020-10-24 DIAGNOSIS — S93491D Sprain of other ligament of right ankle, subsequent encounter: Secondary | ICD-10-CM | POA: Diagnosis not present

## 2020-10-24 NOTE — Assessment & Plan Note (Addendum)
Has been improving. Mild pain with walking.  Significant improvement in the swelling. -Counseled on home exercise therapy and supportive care. -Lace up ankle brace. -Could consider physical therapy or MRI if needed.

## 2020-10-24 NOTE — Progress Notes (Signed)
[name] - 34 y.o. female MRN [id number]  Date of birth: 12/22/1985  SUBJECTIVE:  Including CC & ROS.  Chief Complaint  Patient presents with  . Follow-up    right ankle    [name] [name] is a 34 y.o. female that is following up for right ankle pain.  She reports improvement in the swelling.  Only having intermittent pain.  It does seem to be exacerbated with prolonged walking.   Review of Systems See HPI   HISTORY: Past Medical, Surgical, Social, and Family History Reviewed & Updated per EMR.   Pertinent Historical Findings include:  Past Medical History:  Diagnosis Date  . Abnormal Pap smear of cervix     teenager,  repeat pap smear  . Gestational diabetes   . Headache(784.0)    tension only  . Heart murmur    no treatment for as child    Past Surgical History:  Procedure Laterality Date  . BUNIONECTOMY  2005  . DILATION AND CURETTAGE OF UTERUS      Family History  Problem Relation Age of Onset  . Hypertension Mother   . Thyroid disease Mother   . Hypertension Maternal Grandmother     Social History   Socioeconomic History  . Marital status: Single    Spouse name: n/a  . Number of children: 1  . Years of education: 12th  . Highest education level: Not on file  Occupational History  . Occupation: Engineer, mining    Comment: [employer]  . 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 Occupation: LAB [employer]    Employer: [employer]  Tobacco Use  . Smoking status: Never Smoker  . Smokeless tobacco: Never Used  Vaping Use  . Vaping Use: Never used  Substance and Sexual Activity  . Alcohol use: Yes    Alcohol/week: 0.0 standard drinks    Comment: Rare  . Drug use: No  . Sexual activity: Yes    Partners: Male    Birth control/protection: Abstinence  Other Topics Concern  . Not on file  Social History Narrative  . Not on file   Social Determinants of Health   Financial Resource Strain:   . Difficulty of Paying Living  Expenses: Not on file  Food Insecurity:   . Worried About Programme researcher, broadcasting/film/video in the Last Year: Not on file  . Ran Out of Food in the Last Year: Not on file  Transportation Needs:   . Lack of Transportation (Medical): Not on file  . Lack of Transportation (Non-Medical): Not on file  Physical Activity:   . Days of Exercise per Week: Not on file  . Minutes of Exercise per Session: Not on file  Stress:   . Feeling of Stress : Not on file  Social Connections:   . Frequency of Communication with Friends and Family: Not on file  . Frequency of Social Gatherings with Friends and Family: Not on file  . Attends Religious Services: Not on file  . Active Member of Clubs or Organizations: Not on file  . Attends Banker Meetings: Not on file  . Marital Status: Not on file  Intimate Partner Violence:   . Fear of Current or Ex-Partner: Not on file  . Emotionally Abused: Not on file  . Physically Abused: Not on file  . Sexually Abused: Not on file     PHYSICAL EXAM:  VS: BP 118/72   Pulse 76  Ht 5\' 1"  (1.549 m)   Wt 194 lb (88 kg)   LMP 09/25/2020   BMI 36.66 kg/m  Physical Exam Gen: NAD, alert, cooperative with exam, well-appearing MSK:  Right ankle: Some swelling over the ATFL. Mild translation with anterior drawer. No swelling of the dorsum of the foot. Normal range of motion. Neurovascularly intact     ASSESSMENT & PLAN:   Sprain of anterior talofibular ligament of right ankle Has been improving. Mild pain with walking.  Significant improvement in the swelling. -Counseled on home exercise therapy and supportive care. -Lace up ankle brace. -Could consider physical therapy or MRI if needed.

## 2020-10-24 NOTE — Patient Instructions (Signed)
Good to see you Please try the brace  Please use ice as needed  Please continue the exercises   Please send me a message in MyChart with any questions or updates.  Please see me back in 4 weeks .   --Dr. Jordan Likes

## 2020-11-21 ENCOUNTER — Ambulatory Visit (INDEPENDENT_AMBULATORY_CARE_PROVIDER_SITE_OTHER): Payer: Managed Care, Other (non HMO) | Admitting: Family Medicine

## 2020-11-21 DIAGNOSIS — F430 Acute stress reaction: Secondary | ICD-10-CM | POA: Diagnosis not present

## 2020-11-21 NOTE — Patient Instructions (Signed)
Good to see you Please continue the exercises   Please send me a message in MyChart with any questions or updates.  We will setup a virtual visit once the MRI is resulted.   --Dr. Kesa Birky  

## 2020-11-21 NOTE — Progress Notes (Signed)
  [name] [name] - 34 y.o. female MRN [id number]  Date of birth: 06/23/1986  SUBJECTIVE:  Including CC & ROS.  No chief complaint on file.   [name] [name] is a 34 y.o. female that is presenting with ongoing right ankle pain following her injury from 2 months ago.  The swelling has improved but she continues to have fibular pain laterally.  This is continued through the whole course of this injury.   Review of Systems See HPI   HISTORY: Past Medical, Surgical, Social, and Family History Reviewed & Updated per EMR.   Pertinent Historical Findings include:  Past Medical History:  Diagnosis Date  . Abnormal Pap smear of cervix     teenager,  repeat pap smear  . Gestational diabetes   . Headache(784.0)    tension only  . Heart murmur    no treatment for as child    Past Surgical History:  Procedure Laterality Date  . BUNIONECTOMY  2005  . DILATION AND CURETTAGE OF UTERUS      Family History  Problem Relation Age of Onset  . Hypertension Mother   . Thyroid disease Mother   . Hypertension Maternal Grandmother     Social History   Socioeconomic History  . Marital status: Single    Spouse name: n/a  . Number of children: 1  . Years of education: 12th  . Highest education level: Not on file  Occupational History  . Occupation: Engineer, mining    Comment: [employer]  . 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 Occupation: LAB [employer]    Employer: [employer]  Tobacco Use  . Smoking status: Never Smoker  . Smokeless tobacco: Never Used  Vaping Use  . Vaping Use: Never used  Substance and Sexual Activity  . Alcohol use: Yes    Alcohol/week: 0.0 standard drinks    Comment: Rare  . Drug use: No  . Sexual activity: Yes    Partners: Male    Birth control/protection: Abstinence  Other Topics Concern  . Not on file  Social History Narrative  . Not on file   Social Determinants of Health   Financial Resource Strain: Not on file  Food  Insecurity: Not on file  Transportation Needs: Not on file  Physical Activity: Not on file  Stress: Not on file  Social Connections: Not on file  Intimate Partner Violence: Not on file     PHYSICAL EXAM:  VS: BP 118/70   Ht 5\' 1"  (1.549 m)   Wt 194 lb (88 kg)   BMI 36.66 kg/m  Physical Exam Gen: NAD, alert, cooperative with exam, well-appearing MSK:  Right ankle: Tenderness to palpation of the lateral malleolus. Tender to palpation over the lateral ankle joint. No ecchymosis or swelling. Neurovascular intact     ASSESSMENT & PLAN:   Stress reaction Initial injury was on 10/26.  Pain is occurring still over the lateral malleolus.  This is been ongoing since the original injury.  Concerned that there is a bony involvement as to the continuation of her pain. -Counseled on home exercise therapy and supportive care. -MRI to evaluate for occult fracture or stress changes.

## 2020-11-21 NOTE — Assessment & Plan Note (Addendum)
Initial injury was on 10/26.  Pain is occurring still over the lateral malleolus.  This is been ongoing since the original injury.  Concerned that there is a bony involvement as to the continuation of her pain. -Counseled on home exercise therapy and supportive care. -MRI to evaluate for occult fracture or stress changes.

## 2020-11-24 ENCOUNTER — Telehealth: Payer: Self-pay | Admitting: Family Medicine

## 2020-11-24 NOTE — Telephone Encounter (Signed)
-----   Message from Annita Brod, [place] sent at 11/24/2020  2:16 PM EST ----- Call and get her a virtual appt to discuss her mri results.... it has to be after 12/18/20  thx

## 2020-11-24 NOTE — Telephone Encounter (Signed)
Left pt message to call office to schedule Virtual MRI review appt---glh

## 2020-12-15 ENCOUNTER — Telehealth: Payer: Self-pay | Admitting: Family Medicine

## 2020-12-15 NOTE — Telephone Encounter (Signed)
Taron frm [facility] cld regarding missing Auth for pt's MRI scheduled 12/18/20.   -- Rep request a call back with status of either Appeal of denied MRI & or ask if appt needs to be cancelled until approval given?  --Forwarding message to med asst.  -glh

## 2020-12-18 ENCOUNTER — Other Ambulatory Visit: Payer: Managed Care, Other (non HMO)

## 2020-12-19 ENCOUNTER — Telehealth: Payer: Managed Care, Other (non HMO) | Admitting: Family Medicine

## 2021-01-08 ENCOUNTER — Ambulatory Visit
Admission: RE | Admit: 2021-01-08 | Discharge: 2021-01-08 | Disposition: A | Discharge status: Home / Self Care | Payer: Managed Care, Other (non HMO) | Source: Ambulatory Visit | Attending: Family Medicine | Admitting: Family Medicine

## 2021-01-08 ENCOUNTER — Other Ambulatory Visit: Payer: Self-pay

## 2021-01-08 DIAGNOSIS — F430 Acute stress reaction: Secondary | ICD-10-CM

## 2021-01-08 IMAGING — MR MR ANKLE*R* W/O CM
5 series · 40 of 40 positions shown · non-contrast
Comparison: None.

CLINICAL DATA: Pain around talus swelling for 3 months twisted
ankle

EXAM:
MRI OF THE RIGHT ANKLE WITHOUT CONTRAST
TECHNIQUE: Multiplanar, multisequence MR imaging of the ankle was performed. No
intravenous contrast was administered.

[Series 4: T2 fat-sat · axial · 3.0mm · 0.62mm/px · z∈[-81,+40]mm · 8 of 32 slices shown (1 of 2)]
[im 1/32]
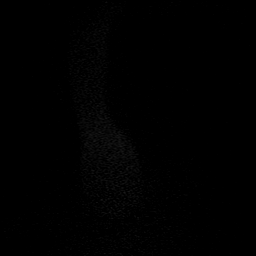
[im 5/32]
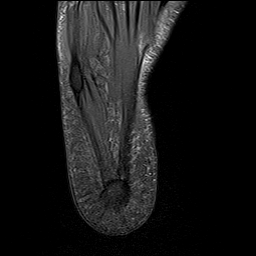
[im 9/32]
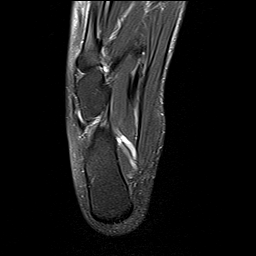
[im 14/32]
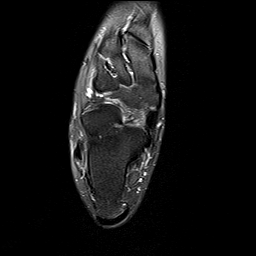
[im 18/32]
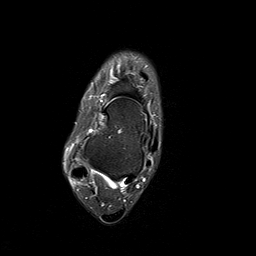
[im 23/32]
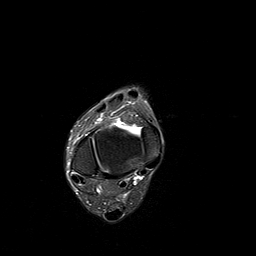
[im 27/32]
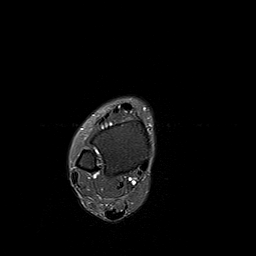
[im 32/32]
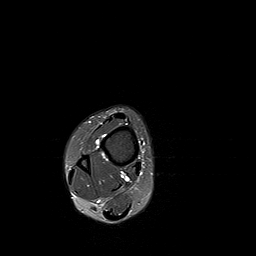

[Series 5: PD fat-sat · axial · 3.0mm · 0.62mm/px · z∈[-81,+40]mm · 9 of 32 slices shown]
[im 1/32]
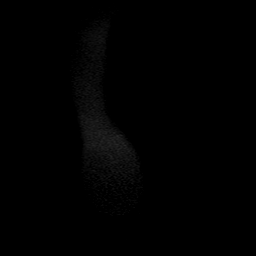
[im 4/32]
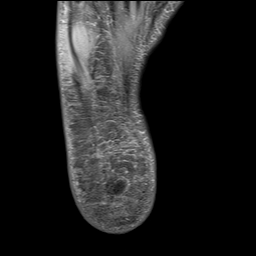
[im 8/32]
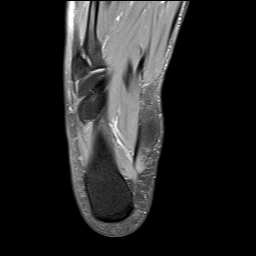
[im 12/32]
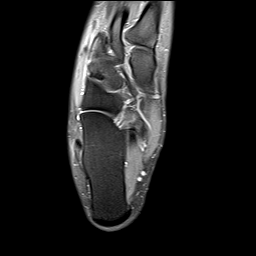
[im 16/32]
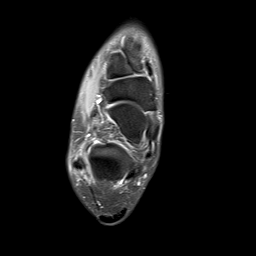
[im 20/32]
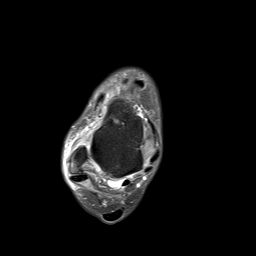
[im 24/32]
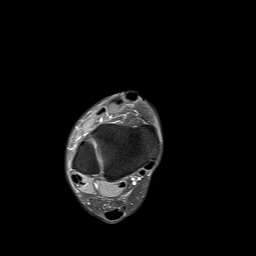
[im 28/32]
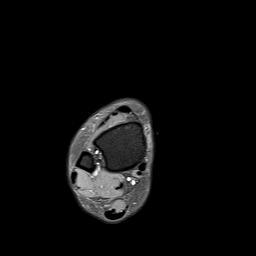
[im 32/32]
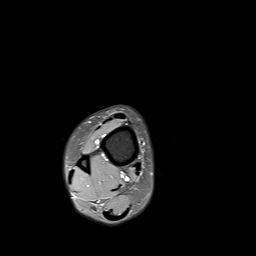

[Series 6: T1 · sagittal · 4.0mm · 0.66mm/px · 6 of 20 slices shown]
[im 1/20]
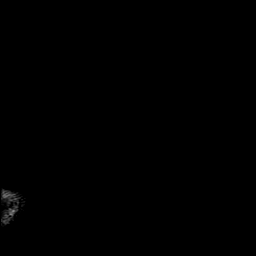
[im 4/20]
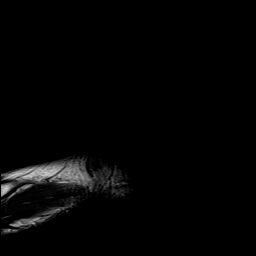
[im 8/20]
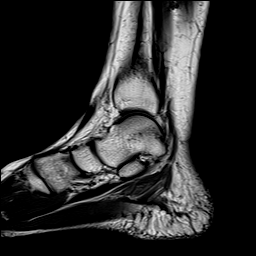
[im 12/20]
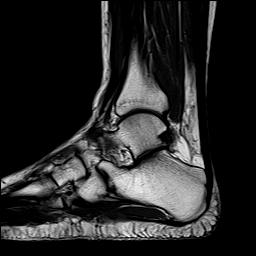
[im 16/20]
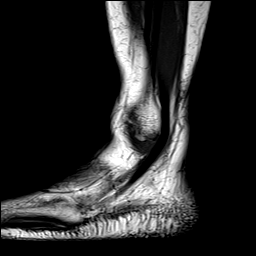
[im 20/20]
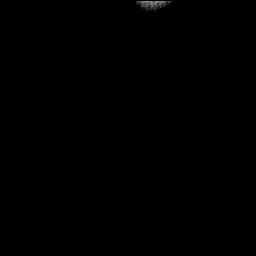

[Series 7: STIR · sagittal · 4.0mm · 0.33mm/px · 6 of 20 slices shown]
[im 1/20]
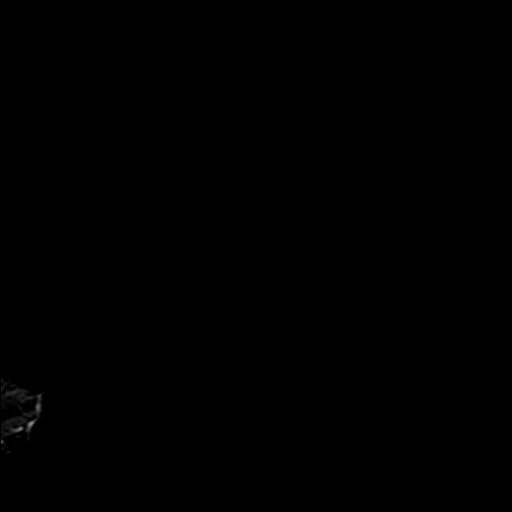
[im 4/20]
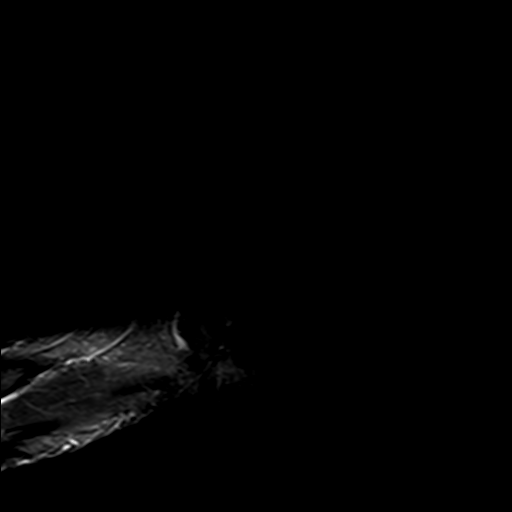
[im 8/20]
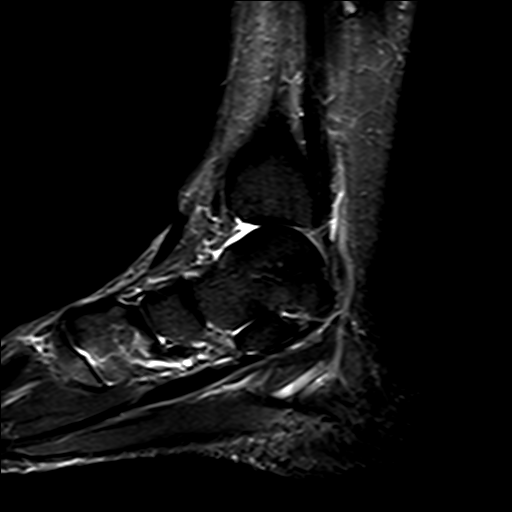
[im 12/20]
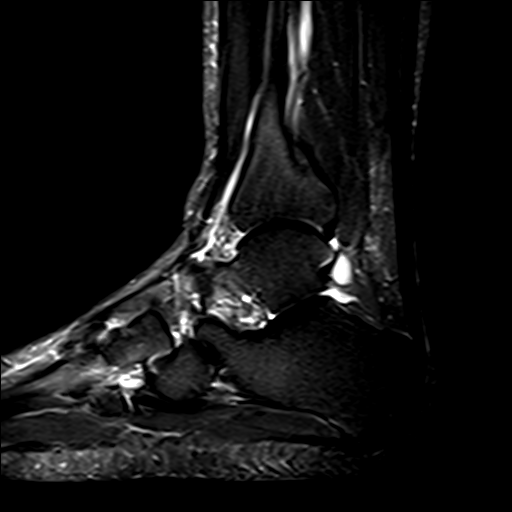
[im 16/20]
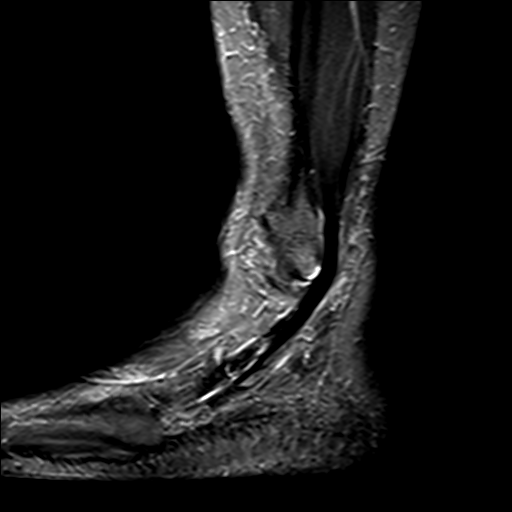
[im 20/20]
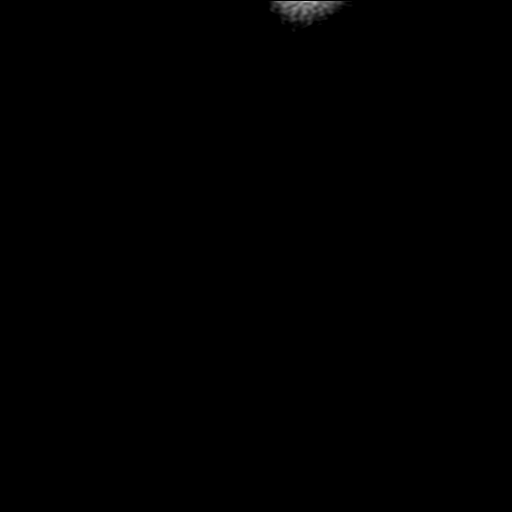

[Series 8: T2 fat-sat · coronal · 3.0mm · 0.62mm/px · 11 of 39 slices shown (2 of 2)]
[im 1/39]
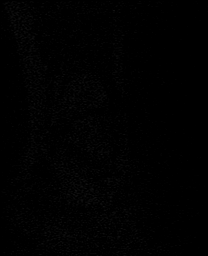
[im 4/39]
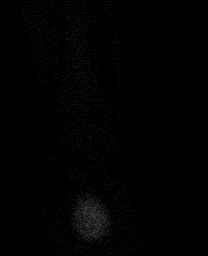
[im 8/39]
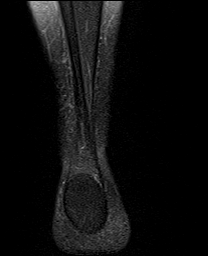
[im 12/39]
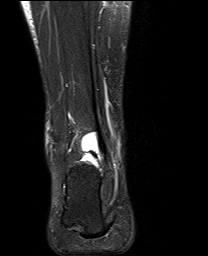
[im 16/39]
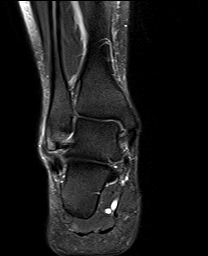
[im 20/39]
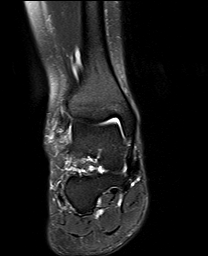
[im 23/39]
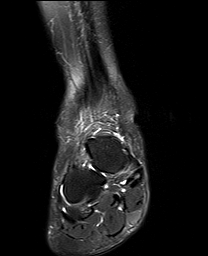
[im 27/39]
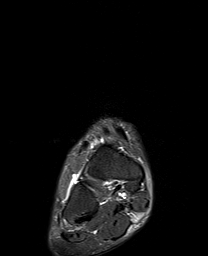
[im 31/39]
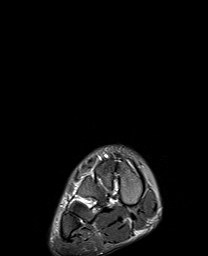
[im 35/39]
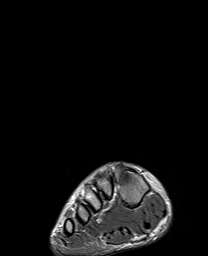
[im 39/39]
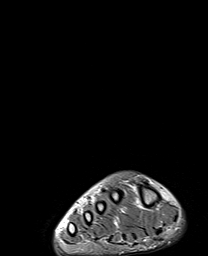

[40 of 40 positions shown; findings below may reference images not displayed]

FINDINGS: TENDONS

Peroneal: There is a tiny focal longitudinal split tear seen at the
level of the posterior calcaneus with a normal distal insertion
site. There is a small amount of fluid seen surrounding the peroneal
tendons.

Posteromedial: Posterior tibial tendon intact. Flexor hallucis
longus tendon intact. Flexor digitorum longus tendon intact.

Anterior: Tibialis anterior tendon intact. Extensor hallucis longus
tendon intact Extensor digitorum longus tendon intact.

Achilles:  Intact.

Plantar Fascia: Intact.

LIGAMENTS

Lateral: Increased signal seen within the anterior and posterior
talofibular ligaments with surrounding soft tissue edema. However
there is intact fibers seen throughout. Anterior and posterior
tibiofibular ligaments intact.

Medial: Deltoid ligament intact. Spring ligament intact.

CARTILAGE

Ankle Joint: There is a small ankle joint effusion. Normal ankle
mortise. No chondral defect.

Subtalar Joints/Sinus Tarsi: Normal subtalar joints. Trace subtalar
joint fluid is noted. Mild edema seen within the sinus tarsi.

Bones: Increased marrow signal seen within the distal fibular tip at
the insertion site of the talofibular ligaments. Surrounding soft
tissue edema seen. No osseous fracture seen.

Soft Tissue: Soft tissue edema seen around the lateral malleolus.
IMPRESSION: 1. Small focal longitudinal split tear of the peroneal brevis tendon
at the level of the hindfoot with a normal distal insertion site.
2. Mild peroneal tenosynovitis.
3. Intrasubstance sprain of the anterior and posterior talofibular
ligaments with adjacent reactive marrow within the lateral
malleolus. No definite osseous fracture
4. Small ankle and subtalar joint effusion
5. Mild edema within the sinus tarsi.

## 2021-01-09 ENCOUNTER — Ambulatory Visit (INDEPENDENT_AMBULATORY_CARE_PROVIDER_SITE_OTHER): Payer: 59 | Admitting: Podiatry

## 2021-01-09 ENCOUNTER — Ambulatory Visit (INDEPENDENT_AMBULATORY_CARE_PROVIDER_SITE_OTHER): Payer: 59

## 2021-01-09 ENCOUNTER — Encounter: Payer: Self-pay | Admitting: Podiatry

## 2021-01-09 DIAGNOSIS — M79671 Pain in right foot: Secondary | ICD-10-CM

## 2021-01-09 DIAGNOSIS — M779 Enthesopathy, unspecified: Secondary | ICD-10-CM

## 2021-01-09 DIAGNOSIS — T148XXA Other injury of unspecified body region, initial encounter: Secondary | ICD-10-CM

## 2021-01-09 DIAGNOSIS — M79672 Pain in left foot: Secondary | ICD-10-CM

## 2021-01-09 DIAGNOSIS — M778 Other enthesopathies, not elsewhere classified: Secondary | ICD-10-CM

## 2021-01-09 MED ORDER — PREDNISONE 10 MG PO TABS: ORAL_TABLET | ORAL | 0 refills | Status: DC

## 2021-01-09 NOTE — Progress Notes (Signed)
Subjective:   Patient ID: [name] [name], female   DOB: 35 y.o.   MRN: [id number]   HPI Patient states she twisted her ankle in October she has been having foot pain and recently the top of the foot started to bother her a lot more. She had an MRI that she wanted to review with me done yesterday   ROS      Objective:  Physical Exam  Neurovascular status intact a lot of discomfort within the peroneal tarsus complex mild discomfort peroneal brevis complex with discomfort into the sinus tarsi and plantar fascial right     Assessment:  Probability that this patient has created inflammatory reaction with a small tear of the peroneal tendon which could be contributory to the pain     Plan:  H&P reviewed all conditions and at this point I did inject the sinus tarsi and into the peroneal tarsus area right 3 mg Dexasone Kenalog 5 mg Xylocaine placed on a Sterapred DS Dosepak and applied air fracture walker to completely immobilize foot lower leg due to the multiple points of pain. Reappoint 3 weeks or earlier if needed  X-rays indicate that there is no signs of fracture associated with this acute injury dorsal foot appears to be soft tissue

## 2021-01-10 ENCOUNTER — Other Ambulatory Visit: Payer: Self-pay

## 2021-01-10 ENCOUNTER — Telehealth (INDEPENDENT_AMBULATORY_CARE_PROVIDER_SITE_OTHER): Payer: Managed Care, Other (non HMO) | Admitting: Family Medicine

## 2021-01-10 DIAGNOSIS — M7671 Peroneal tendinitis, right leg: Secondary | ICD-10-CM | POA: Diagnosis not present

## 2021-01-10 NOTE — Assessment & Plan Note (Signed)
Having changes of the peroneal tendons that is likely leading to the reactive marrow edema and effusion due to the instability. Received an injection yesterday.  - counseled on home exercise therapy and supportive care - consider custom orthotics.  - referral to PT.

## 2021-01-10 NOTE — Progress Notes (Signed)
Virtual Visit via Video Note  I connected with Viona Gilmore on 01/10/21 at  1:20 PM EST by a video enabled telemedicine application and verified that I am speaking with the correct person using two identifiers.  Location: Patient: work Provider: home   I discussed the limitations of evaluation and management by telemedicine and the availability of in person appointments. The patient expressed understanding and agreed to proceed.  History of Present Illness:  Ms. Hendriks is a 35 yo F that is following up after her MRI. This was revealing for peroneal tenosynovitis with reactive marrow within the lateral malleolus. Small ankle effusion and subtalar joint effusion.    Observations/Objective:  Gen: NAD, alert, cooperative with exam, well-appearing   Assessment and Plan:  Peroneal Tendinitis:  Having changes of the peroneal tendons that is likely leading to the reactive marrow edema and effusion due to the instability. Received an injection yesterday.  - counseled on home exercise therapy and supportive care - consider custom orthotics.  - referral to PT.   Follow Up Instructions:    I discussed the assessment and treatment plan with the patient. The patient was provided an opportunity to ask questions and all were answered. The patient agreed with the plan and demonstrated an understanding of the instructions.   The patient was advised to call back or seek an in-person evaluation if the symptoms worsen or if the condition fails to improve as anticipated.    Clare Gandy, MD

## 2021-01-30 ENCOUNTER — Ambulatory Visit: Payer: Managed Care, Other (non HMO) | Attending: Family Medicine | Admitting: Physical Therapy

## 2021-01-30 ENCOUNTER — Encounter: Payer: Self-pay | Admitting: Podiatry

## 2021-01-30 ENCOUNTER — Ambulatory Visit (INDEPENDENT_AMBULATORY_CARE_PROVIDER_SITE_OTHER): Payer: 59 | Admitting: Podiatry

## 2021-01-30 ENCOUNTER — Other Ambulatory Visit: Payer: Self-pay

## 2021-01-30 ENCOUNTER — Encounter: Payer: Self-pay | Admitting: Physical Therapy

## 2021-01-30 DIAGNOSIS — M25571 Pain in right ankle and joints of right foot: Secondary | ICD-10-CM | POA: Diagnosis present

## 2021-01-30 DIAGNOSIS — M779 Enthesopathy, unspecified: Secondary | ICD-10-CM | POA: Diagnosis not present

## 2021-01-30 DIAGNOSIS — M76821 Posterior tibial tendinitis, right leg: Secondary | ICD-10-CM

## 2021-01-30 DIAGNOSIS — R600 Localized edema: Secondary | ICD-10-CM | POA: Diagnosis present

## 2021-01-30 DIAGNOSIS — M25671 Stiffness of right ankle, not elsewhere classified: Secondary | ICD-10-CM | POA: Diagnosis present

## 2021-01-30 DIAGNOSIS — R262 Difficulty in walking, not elsewhere classified: Secondary | ICD-10-CM | POA: Diagnosis present

## 2021-01-30 DIAGNOSIS — M778 Other enthesopathies, not elsewhere classified: Secondary | ICD-10-CM

## 2021-01-30 MED ORDER — DICLOFENAC SODIUM 75 MG PO TBEC: 75.0000 mg | DELAYED_RELEASE_TABLET | Freq: Two times a day (BID) | ORAL | 2 refills | Status: DC

## 2021-01-30 NOTE — Progress Notes (Signed)
Subjective:   Patient ID: [name] [name], female   DOB: 35 y.o.   MRN: [id number]   HPI Patient states she is improved not perfect but better than she was previously   ROS      Objective:  Physical Exam  Neurovascular status intact with discomfort still noted upon motion and deep palpation but improved from previous visit     Assessment:  Doing well overall inflammation of the foot left     Plan:  H&P reviewed topical medicines oral medicines physical therapy and continued immobilization as needed.  Patient will be seen back and is discharged but encouraged to call with questions

## 2021-01-30 NOTE — Therapy (Signed)
[facility] [address] [place], [place], [address] Phone: [phone]   Fax:  [phone]  Physical Therapy Evaluation  Patient Details  Name: [name] [name] MRN: [id number] Date of Birth: 1986/02/13 Referring Provider (PT): Lenn Cal Date: 01/30/2021   PT End of Session - 01/30/21 1228    Visit Number 1    Date for PT Re-Evaluation 03/30/21    PT Start Time 1107    PT Stop Time 1142    PT Time Calculation (min) 35 min    Activity Tolerance Patient tolerated treatment well    Behavior During Therapy [facility] for tasks assessed/performed           Past Medical History:  Diagnosis Date  . Abnormal Pap smear of cervix     teenager,  repeat pap smear  . Gestational diabetes   . Headache(784.0)    tension only  . Heart murmur    no treatment for as child    Past Surgical History:  Procedure Laterality Date  . BUNIONECTOMY  2005  . DILATION AND CURETTAGE OF UTERUS      There were no vitals filed for this visit.    Subjective Assessment - 01/30/21 1106    Subjective Pt reports in Oct 2021 she sustained lateral ankle sprain while playing paintball with her son. States that she was in cam boot for 6 weeks. Has had ongoing pain on lateral side of R ankle and recently top of R foot. Had injection ~3 weeks ago and pain on top of R foot has since resolved. Recently had MRI that showed no bony abnormalities but did have signficant effusion/swelling in foot. Pt works at [employer] and is required to be on feet for up to 8 hours. Has trouble standing for more than an hour without increased pain. Denies N/T in foot. Does state occasional sharp, radiating pain up lateral side of right leg.    Pertinent History reports hx of 3 prior R ankle sprains    Limitations Standing;Walking    How long can you stand comfortably? ~1 hour    Diagnostic tests MRI R ankle    Patient Stated Goals increase ankle stability, reduce ankle  pain/swelling    Currently in Pain? Yes    Pain Score 4     Pain Location Ankle    Pain Orientation Right    Pain Descriptors / Indicators Dull    Pain Type Chronic pain    Pain Radiating Towards occasionally radiating toward R lateral leg    Pain Onset More than a month ago    Pain Frequency Intermittent    Aggravating Factors  standing, walking    Pain Relieving Factors hot bath, ice, rest, massage              [facility] PT Assessment - 01/30/21 0001      Assessment   Medical Diagnosis R peroneal tendonitis    Referring Provider (PT) Jordan Likes    Onset Date/Surgical Date --   Oct 2021   Prior Therapy yes      Precautions   Precautions None      Restrictions   Weight Bearing Restrictions No      Balance Screen   Has the patient fallen in the past 6 months Yes    How many times? 1    Has the patient had a decrease in activity level because of a fear of falling?  No    Is the patient  reluctant to leave their home because of a fear of falling?  No      Home Environment   Additional Comments 6 stairs in home; reports no trouble with stairs      Prior Function   Level of Independence Independent    Vocation Full time employment    Vocation Requirements [employer]    Leisure walking, playing with son, exercise      Observation/Other Assessments-Edema    Edema Figure 8      Figure 8 Edema   Figure 8 - Right  57 cm    Figure 8 - Left  58 cm      Sensation   Light Touch Appears Intact      Functional Tests   Functional tests Sit to Stand;Single leg stance      Single Leg Stance   Comments decreased stability RLE      Sit to Stand   Comments WFL      ROM / Strength   AROM / PROM / Strength AROM;PROM;Strength      AROM   AROM Assessment Site Ankle    Right/Left Ankle Right    Right Ankle Dorsiflexion -20    Right Ankle Plantar Flexion 35    Right Ankle Inversion 20    Right Ankle Eversion 5      PROM   PROM Assessment Site Ankle    Right/Left Ankle Right     Right Ankle Dorsiflexion -10    Right Ankle Plantar Flexion 40   + pain at end range   Right Ankle Inversion 25    Right Ankle Eversion 10      Strength   Strength Assessment Site Ankle    Right/Left Ankle Right    Right Ankle Dorsiflexion 4+/5    Right Ankle Plantar Flexion 3+/5    Right Ankle Inversion 4+/5    Right Ankle Eversion 4/5      Palpation   Palpation comment tender to palpation distal R peroneal tendon      Ambulation/Gait   Gait Comments mild antalgic gait                      Objective measurements completed on examination: See above findings.       [facility] Adult PT Treatment/Exercise - 01/30/21 0001      Exercises   Exercises Ankle      Ankle Exercises: Stretches   Other Stretch inversion/eversion stretch      Ankle Exercises: Seated   Heel Raises 10 reps    Toe Raise 10 reps    Other Seated Ankle Exercises red TB ankle 4 ways                  PT Education - 01/30/21 1228    Education Details Pt educated on POC and HEP    Person(s) Educated Patient    Methods Explanation;Demonstration;Handout    Comprehension Verbalized understanding;Returned demonstration            PT Short Term Goals - 01/30/21 1234      PT SHORT TERM GOAL #1   Title Pt will be I with initial HEP    Time 2    Period Weeks    Status New    Target Date 02/13/21             PT Long Term Goals - 01/30/21 1234      PT LONG TERM GOAL #1   Title Pt will be  I with advanced HEP    Time 6    Period Weeks    Status New    Target Date 03/13/21      PT LONG TERM GOAL #2   Title Pt will demo R ankle DF to neutral    Baseline -20    Time 6    Period Weeks    Status New    Target Date 03/13/21      PT LONG TERM GOAL #3   Title Pt will demo R ankle MMT equivalent to L ankle    Time 6    Period Weeks    Status New    Target Date 03/13/21      PT LONG TERM GOAL #4   Title Pt will report 50% reduction in R ankle pain    Time 6    Period  Weeks    Status New    Target Date 03/13/21      PT LONG TERM GOAL #5   Title Pt will report able to work through shift at [employer] with no increase in R ankle pain    Time 6    Period Weeks    Status New    Target Date 03/13/21                  Plan - 01/30/21 1228    Clinical Impression Statement Pt presents to clinic s/p R inversion ankle sprain ~Oct 2021 with ongoing chronic pain/swelling. Pt MRI showed small longitudinal tear of peroneal brevis tendon along with effusion/edema around distal peroneal tendon. Pt was given small ankle stability brace today from MD. Reports no ROM/WB restrictions. Demos tenderness to palpation along R peroneal tendon. R ankle DF very limited and pt reports pain at end range ankle ROM. Strength testing accuracy limited d/t pain. Pt works at [employer] and is required to stand/walk for prolonged periods throughout the day. Pt would benefit from skilled PT to address ankle stability, ROM, swelling, and strength in order to return to PLOF.    Examination-Activity Limitations Stand;Locomotion Level    Examination-Participation Restrictions Occupation;Community Activity;Interpersonal Relationship    Stability/Clinical Decision Making Stable/Uncomplicated    Clinical Decision Making Low    Rehab Potential Good    PT Frequency 2x / week   1x/week d/t schedule   PT Duration 6 weeks    PT Treatment/Interventions ADLs/Self Care Home Management;Electrical Stimulation;Iontophoresis 4mg /ml Dexamethasone;Neuromuscular re-education;Balance training;Therapeutic exercise;Therapeutic activities;Patient/family education;Manual techniques;Passive range of motion;Taping;Vasopneumatic Device    PT Next Visit Plan initiate ankle ROM, stability ex's, and manual/modalities as indicated. vaso for swelling if indicated    PT Home Exercise Plan see pt instructions    Consulted and Agree with Plan of Care Patient           Patient will benefit from skilled therapeutic  intervention in order to improve the following deficits and impairments:  Abnormal gait,Decreased range of motion,Difficulty walking,Increased muscle spasms,Pain,Decreased balance,Impaired flexibility,Decreased strength,Increased edema  Visit Diagnosis: Pain in right ankle and joints of right foot  Stiffness of right ankle, not elsewhere classified  Difficulty in walking, not elsewhere classified  Localized edema     Problem List Patient Active Problem List   Diagnosis Date Noted  . Peroneal tendinitis 09/28/2020  . SVD (9/18) 08/22/2019  . First degree perineal laceration 08/22/2019  . Gestational diabetes mellitus in pregnancy, diet controlled 08/22/2019  . Adjustment disorder with anxiety 07/08/2017  . Chest pain 03/05/2016  . Murmur 03/05/2016  . Palpitations 02/19/2016  . Unspecified  constipation 07/21/2014  . ZDGLOVFI(433.2) 11/16/2013   Lysle Rubens, PT, DPT Maryanna Shape Janifer Gieselman 01/30/2021, 12:36 PM  [facility] [address] [place], [place], [address] Phone: [phone]   Fax:  [phone]  Name: JASLYNNE DAHAN MRN: [id number] Date of Birth: November 27, 1986

## 2021-01-30 NOTE — Patient Instructions (Signed)
Access Code: [email or web address] URL: [email or web address] Date: 01/30/2021 Prepared by: Lysle Rubens  Exercises Seated Heel Raise - 1 x daily - 7 x weekly - 3 sets - 10 reps Seated Toe Raise - 1 x daily - 7 x weekly - 3 sets - 10 reps Ankle Eversion with Resistance - 1 x daily - 7 x weekly - 3 sets - 10 reps Ankle Inversion with Resistance - 1 x daily - 7 x weekly - 3 sets - 10 reps Ankle Dorsiflexion with Resistance - 1 x daily - 7 x weekly - 3 sets - 10 reps Ankle and Toe Plantarflexion with Resistance - 1 x daily - 7 x weekly - 3 sets - 10 reps Seated Ankle Inversion Eversion PROM - 1 x daily - 7 x weekly - 3 sets - 10 reps

## 2021-02-06 ENCOUNTER — Other Ambulatory Visit: Payer: Self-pay

## 2021-02-06 ENCOUNTER — Ambulatory Visit: Payer: Managed Care, Other (non HMO) | Attending: Family Medicine | Admitting: Physical Therapy

## 2021-02-06 ENCOUNTER — Encounter: Payer: Self-pay | Admitting: Physical Therapy

## 2021-02-06 DIAGNOSIS — R262 Difficulty in walking, not elsewhere classified: Secondary | ICD-10-CM | POA: Diagnosis present

## 2021-02-06 DIAGNOSIS — R600 Localized edema: Secondary | ICD-10-CM | POA: Diagnosis present

## 2021-02-06 DIAGNOSIS — M25571 Pain in right ankle and joints of right foot: Secondary | ICD-10-CM | POA: Diagnosis not present

## 2021-02-06 DIAGNOSIS — M25671 Stiffness of right ankle, not elsewhere classified: Secondary | ICD-10-CM | POA: Insufficient documentation

## 2021-02-06 NOTE — Therapy (Signed)
[facility] [place] [facility] [address] [place], [place], [address] Phone: [phone]   Fax:  [phone]  Physical Therapy Treatment  Patient Details  Name: [name] [name] MRN: [id number] Date of Birth: 1985/12/11 Referring Provider (PT): Lenn Cal Date: 02/06/2021   PT End of Session - 02/06/21 1529    Visit Number 2    Date for PT Re-Evaluation 03/30/21    PT Start Time 1444    PT Stop Time 1526    PT Time Calculation (min) 42 min    Activity Tolerance Patient tolerated treatment well    Behavior During Therapy [facility] for tasks assessed/performed           Past Medical History:  Diagnosis Date  . Abnormal Pap smear of cervix     teenager,  repeat pap smear  . Gestational diabetes   . Headache(784.0)    tension only  . Heart murmur    no treatment for as child    Past Surgical History:  Procedure Laterality Date  . BUNIONECTOMY  2005  . DILATION AND CURETTAGE OF UTERUS      There were no vitals filed for this visit.   Subjective Assessment - 02/06/21 1444    Subjective Pt reports that home exercises are going well.    Currently in Pain? Yes    Pain Score 3     Pain Location Ankle    Pain Orientation Right                             [facility] Adult PT Treatment/Exercise - 02/06/21 0001      Ankle Exercises: Aerobic   Recumbent Bike L2 x 6 min    Nustep L5 x 6 min      Ankle Exercises: Stretches   Gastroc Stretch 1 rep;20 seconds    Slant Board Stretch 1 rep;20 seconds      Ankle Exercises: Standing   Rocker Board 1 minute    Heel Raises Both;15 reps    Other Standing Ankle Exercises standing heel taps from 4" stair 2x10 RLE    Other Standing Ankle Exercises resisted gait 30# x5 each direction      Ankle Exercises: Seated   ABC's 1 rep    Towel Inversion/Eversion --   x10   Heel Raises 20 reps    Toe Raise 20 reps    Other Seated Ankle Exercises sitfit x20 DF/PF and inv/ev      Ankle  Exercises: Machines for Strengthening   Cybex Leg Press 20# 2x15 with DF; 20# heel raises 2x15                    PT Short Term Goals - 02/06/21 1531      PT SHORT TERM GOAL #1   Title Pt will be I with initial HEP    Time 2    Period Weeks    Status Achieved    Target Date 02/13/21             PT Long Term Goals - 01/30/21 1234      PT LONG TERM GOAL #1   Title Pt will be I with advanced HEP    Time 6    Period Weeks    Status New    Target Date 03/13/21      PT LONG TERM GOAL #2   Title Pt will demo R ankle DF  to neutral    Baseline -20    Time 6    Period Weeks    Status New    Target Date 03/13/21      PT LONG TERM GOAL #3   Title Pt will demo R ankle MMT equivalent to L ankle    Time 6    Period Weeks    Status New    Target Date 03/13/21      PT LONG TERM GOAL #4   Title Pt will report 50% reduction in R ankle pain    Time 6    Period Weeks    Status New    Target Date 03/13/21      PT LONG TERM GOAL #5   Title Pt will report able to work through shift at [employer] with no increase in R ankle pain    Time 6    Period Weeks    Status New    Target Date 03/13/21                 Plan - 02/06/21 1529    Clinical Impression Statement Pt tolerated progression to TE well. Reports of mild R ankle pain with standing ex's resolved with rest. Demos most difficulty with inversion/eversion ROM ex's. Increased instability RLE compared to LLE. Continue to progress ankle ROM and stability.    PT Treatment/Interventions ADLs/Self Care Home Management;Electrical Stimulation;Iontophoresis 4mg /ml Dexamethasone;Neuromuscular re-education;Balance training;Therapeutic exercise;Therapeutic activities;Patient/family education;Manual techniques;Passive range of motion;Taping;Vasopneumatic Device    PT Next Visit Plan ankle ROM, stability ex's, and manual/modalities as indicated. vaso for swelling if indicated    Consulted and Agree with Plan of Care Patient            Patient will benefit from skilled therapeutic intervention in order to improve the following deficits and impairments:  Abnormal gait,Decreased range of motion,Difficulty walking,Increased muscle spasms,Pain,Decreased balance,Impaired flexibility,Decreased strength,Increased edema  Visit Diagnosis: Pain in right ankle and joints of right foot  Stiffness of right ankle, not elsewhere classified  Difficulty in walking, not elsewhere classified  Localized edema     Problem List Patient Active Problem List   Diagnosis Date Noted  . Peroneal tendinitis 09/28/2020  . SVD (9/18) 08/22/2019  . First degree perineal laceration 08/22/2019  . Gestational diabetes mellitus in pregnancy, diet controlled 08/22/2019  . Adjustment disorder with anxiety 07/08/2017  . Chest pain 03/05/2016  . Murmur 03/05/2016  . Palpitations 02/19/2016  . Unspecified constipation 07/21/2014  . 07/23/2014) 11/16/2013   11/18/2013, PT, DPT Lysle Rubens Sugg 02/06/2021, 3:31 PM  [facility] [place] [facility] [address] [place], [place], [place] Phone: [phone]   Fax:  [phone]  Name: LAURNA SHETLEY MRN: Viona Gilmore Date of Birth: Nov 26, 1986

## 2021-02-20 ENCOUNTER — Ambulatory Visit: Payer: Managed Care, Other (non HMO) | Admitting: Physical Therapy

## 2021-02-27 ENCOUNTER — Other Ambulatory Visit: Payer: Self-pay

## 2021-02-27 ENCOUNTER — Encounter: Payer: Self-pay | Admitting: Physical Therapy

## 2021-02-27 ENCOUNTER — Ambulatory Visit: Payer: Managed Care, Other (non HMO) | Admitting: Physical Therapy

## 2021-02-27 DIAGNOSIS — R262 Difficulty in walking, not elsewhere classified: Secondary | ICD-10-CM

## 2021-02-27 DIAGNOSIS — M25671 Stiffness of right ankle, not elsewhere classified: Secondary | ICD-10-CM

## 2021-02-27 DIAGNOSIS — M25571 Pain in right ankle and joints of right foot: Secondary | ICD-10-CM

## 2021-02-27 DIAGNOSIS — R600 Localized edema: Secondary | ICD-10-CM

## 2021-02-27 NOTE — Therapy (Signed)
[facility] [place] [facility] [address] [place], [place], [address] Phone: [phone]   Fax:  [phone]  Physical Therapy Treatment  Patient Details  Name: [name] [name] MRN: [id number] Date of Birth: [birth year] Referring Provider (PT): Lenn Cal Date: 02/27/2021   PT End of Session - 02/27/21 1604    Visit Number 3    Date for PT Re-Evaluation 03/30/21    PT Start Time 1533    PT Stop Time 1612    PT Time Calculation (min) 39 min    Activity Tolerance Patient tolerated treatment well    Behavior During Therapy [facility] for tasks assessed/performed           Past Medical History:  Diagnosis Date  . Abnormal Pap smear of cervix     teenager,  repeat pap smear  . Gestational diabetes   . Headache(784.0)    tension only  . Heart murmur    no treatment for as child    Past Surgical History:  Procedure Laterality Date  . BUNIONECTOMY  2005  . DILATION AND CURETTAGE OF UTERUS      There were no vitals filed for this visit.   Subjective Assessment - 02/27/21 1538    Subjective Reports that she is feeling better, still an ache laterally around the lateral malleolous    Currently in Pain? Yes    Pain Score 3     Pain Location Ankle    Pain Orientation Right;Lateral    Pain Descriptors / Indicators Aching    Aggravating Factors  standing and walking                             [facility] Adult PT Treatment/Exercise - 02/27/21 0001      Modalities   Modalities Iontophoresis      Iontophoresis   Type of Iontophoresis Dexamethasone    Location right posterior lateral malleolous    Dose 74mA    Time 4 hour patch      Ankle Exercises: Aerobic   Recumbent Bike L2 x 6 min    Nustep L5 x 6 min      Ankle Exercises: Stretches   Soleus Stretch 3 reps;20 seconds    Gastroc Stretch 3 reps;20 seconds      Ankle Exercises: Standing   Other Standing Ankle Exercises on airex SLS balance      Ankle Exercises:  Seated   Heel Raises 20 reps    Toe Raise 20 reps    Other Seated Ankle Exercises red TB ankle 4 ways, on airex ankle circles      Ankle Exercises: Machines for Strengthening   Cybex Leg Press 20# 2x15 with DF; 20# heel raises 2x15                    PT Short Term Goals - 02/06/21 1531      PT SHORT TERM GOAL #1   Title Pt will be I with initial HEP    Time 2    Period Weeks    Status Achieved    Target Date 02/13/21             PT Long Term Goals - 02/27/21 1607      PT LONG TERM GOAL #1   Title Pt will be I with advanced HEP    Status On-going      PT LONG TERM GOAL #2  Title Pt will demo R ankle DF to neutral    Status On-going      PT LONG TERM GOAL #3   Title Pt will demo R ankle MMT equivalent to L ankle    Status On-going                 Plan - 02/27/21 1605    Clinical Impression Statement Patient has tight calves, tolerates exercises well, she does have a tender spot in the right poeterior latral malleolous area, there is a little bit of swelling here, I put ionto patch here today to see if this would help pain and swelling, she does hvae weakness with eversion    PT Next Visit Plan see if ionto helped, continue with strength, start proprioception    Consulted and Agree with Plan of Care Patient           Patient will benefit from skilled therapeutic intervention in order to improve the following deficits and impairments:  Abnormal gait,Decreased range of motion,Difficulty walking,Increased muscle spasms,Pain,Decreased balance,Impaired flexibility,Decreased strength,Increased edema  Visit Diagnosis: Stiffness of right ankle, not elsewhere classified  Pain in right ankle and joints of right foot  Difficulty in walking, not elsewhere classified  Localized edema     Problem List Patient Active Problem List   Diagnosis Date Noted  . Peroneal tendinitis 09/28/2020  . SVD (9/18) 08/22/2019  . First degree perineal laceration  08/22/2019  . Gestational diabetes mellitus in pregnancy, diet controlled 08/22/2019  . Adjustment disorder with anxiety 07/08/2017  . Chest pain 03/05/2016  . Murmur 03/05/2016  . Palpitations 02/19/2016  . Unspecified constipation 07/21/2014  . [id number](956.2) 11/16/2013    Jearld Lesch., PT 02/27/2021, 4:08 PM  [facility] [place] [facility] [address] [place], [place], [address] Phone: [phone]   Fax:  [phone]  Name: [name] [name] MRN: [id number] Date of Birth: 1986-07-14

## 2021-03-08 ENCOUNTER — Ambulatory Visit: Payer: Managed Care, Other (non HMO) | Attending: Family Medicine | Admitting: Physical Therapy

## 2021-03-08 DIAGNOSIS — R600 Localized edema: Secondary | ICD-10-CM | POA: Insufficient documentation

## 2021-03-08 DIAGNOSIS — M25571 Pain in right ankle and joints of right foot: Secondary | ICD-10-CM | POA: Insufficient documentation

## 2021-03-08 DIAGNOSIS — M25671 Stiffness of right ankle, not elsewhere classified: Secondary | ICD-10-CM | POA: Insufficient documentation

## 2021-03-08 DIAGNOSIS — R262 Difficulty in walking, not elsewhere classified: Secondary | ICD-10-CM | POA: Insufficient documentation

## 2021-03-13 ENCOUNTER — Ambulatory Visit: Payer: Managed Care, Other (non HMO) | Admitting: Physical Therapy

## 2021-03-13 ENCOUNTER — Ambulatory Visit: Payer: 59 | Admitting: Podiatry

## 2021-03-20 ENCOUNTER — Other Ambulatory Visit: Payer: Self-pay

## 2021-03-20 ENCOUNTER — Ambulatory Visit: Payer: Managed Care, Other (non HMO) | Admitting: Physical Therapy

## 2021-03-20 ENCOUNTER — Encounter: Payer: Self-pay | Admitting: Physical Therapy

## 2021-03-20 DIAGNOSIS — R600 Localized edema: Secondary | ICD-10-CM | POA: Diagnosis present

## 2021-03-20 DIAGNOSIS — R262 Difficulty in walking, not elsewhere classified: Secondary | ICD-10-CM

## 2021-03-20 DIAGNOSIS — M25571 Pain in right ankle and joints of right foot: Secondary | ICD-10-CM

## 2021-03-20 DIAGNOSIS — M25671 Stiffness of right ankle, not elsewhere classified: Secondary | ICD-10-CM | POA: Diagnosis not present

## 2021-03-20 NOTE — Therapy (Signed)
[facility] [place] [facility] [address] [place], [place], [address] Phone: [phone]   Fax:  [phone]  Physical Therapy Treatment  Patient Details  Name: [name] [name] MRN: [id number] Date of Birth: 12-Jul-1986 Referring Provider (PT): Lenn Cal Date: 03/20/2021   PT End of Session - 03/20/21 1613    Visit Number 4    Date for PT Re-Evaluation 03/30/21    PT Start Time 1445    PT Stop Time 1524    PT Time Calculation (min) 39 min    Activity Tolerance Patient tolerated treatment well    Behavior During Therapy [facility] for tasks assessed/performed           Past Medical History:  Diagnosis Date  . Abnormal Pap smear of cervix     teenager,  repeat pap smear  . Gestational diabetes   . Headache(784.0)    tension only  . Heart murmur    no treatment for as child    Past Surgical History:  Procedure Laterality Date  . BUNIONECTOMY  2005  . DILATION AND CURETTAGE OF UTERUS      There were no vitals filed for this visit.   Subjective Assessment - 03/20/21 1453    Subjective Patient reports that the patch did help, reports that walking still hurts 15-20 minutes and has to rest, pain with cold weather    Currently in Pain? Yes    Pain Score 1     Pain Location Ankle    Pain Orientation Right;Lateral    Pain Relieving Factors the patch helped              [facility] PT Assessment - 03/20/21 0001      AROM   Right Ankle Dorsiflexion -5      Strength   Right Ankle Plantar Flexion 4-/5                         [facility] Adult PT Treatment/Exercise - 03/20/21 0001      Iontophoresis   Type of Iontophoresis Dexamethasone    Location right posterior lateral malleolous    Dose 78mA    Time 4 hour patch      Ankle Exercises: Aerobic   Recumbent Bike L3 x 6 min    Nustep L5 x 6 min      Ankle Exercises: Stretches   Soleus Stretch 3 reps;20 seconds    Gastroc Stretch 3 reps;20 seconds      Ankle  Exercises: Standing   Other Standing Ankle Exercises resisted gait 40# all directions, Airex SLS balance activities      Ankle Exercises: Supine   T-Band all motions red tband      Ankle Exercises: Machines for Strengthening   Cybex Leg Press 30# 2x15 with DF; 30# heel raises 2x15                    PT Short Term Goals - 02/06/21 1531      PT SHORT TERM GOAL #1   Title Pt will be I with initial HEP    Time 2    Period Weeks    Status Achieved    Target Date 02/13/21             PT Long Term Goals - 03/20/21 1615      PT LONG TERM GOAL #1   Title Pt will be I with advanced HEP    Status  Achieved                 Plan - 03/20/21 1613    Clinical Impression Statement Patient continues to report doing well overall, less pain, still tender and sore, still has tight calves, the ROM is improved but still lacking motion for DF, strength is improved but still less than the left    PT Next Visit Plan work on proprioception    Consulted and Agree with Plan of Care Patient           Patient will benefit from skilled therapeutic intervention in order to improve the following deficits and impairments:  Abnormal gait,Decreased range of motion,Difficulty walking,Increased muscle spasms,Pain,Decreased balance,Impaired flexibility,Decreased strength,Increased edema  Visit Diagnosis: Stiffness of right ankle, not elsewhere classified  Pain in right ankle and joints of right foot  Difficulty in walking, not elsewhere classified  Localized edema     Problem List Patient Active Problem List   Diagnosis Date Noted  . Peroneal tendinitis 09/28/2020  . SVD (9/18) 08/22/2019  . First degree perineal laceration 08/22/2019  . Gestational diabetes mellitus in pregnancy, diet controlled 08/22/2019  . Adjustment disorder with anxiety 07/08/2017  . Chest pain 03/05/2016  . Murmur 03/05/2016  . Palpitations 02/19/2016  . Unspecified constipation 07/21/2014  .  ONGEXBMW(413.2) 11/16/2013    Jearld Lesch., PT 03/20/2021, 4:15 PM  [facility] [place] [facility] [address] [place], [place], [address] Phone: [phone]   Fax:  [phone]  Name: [name] [name] MRN: [id number] Date of Birth: 07-23-1986

## 2021-03-27 ENCOUNTER — Ambulatory Visit: Payer: Managed Care, Other (non HMO) | Admitting: Physical Therapy

## 2021-04-03 ENCOUNTER — Ambulatory Visit: Payer: Managed Care, Other (non HMO) | Admitting: Physical Therapy

## 2021-04-10 ENCOUNTER — Ambulatory Visit: Payer: Managed Care, Other (non HMO) | Admitting: Physical Therapy

## 2021-08-14 ENCOUNTER — Other Ambulatory Visit: Payer: Self-pay

## 2021-08-14 ENCOUNTER — Ambulatory Visit (INDEPENDENT_AMBULATORY_CARE_PROVIDER_SITE_OTHER): Payer: Managed Care, Other (non HMO) | Admitting: Podiatry

## 2021-08-14 DIAGNOSIS — M7671 Peroneal tendinitis, right leg: Secondary | ICD-10-CM

## 2021-08-14 DIAGNOSIS — M7751 Other enthesopathy of right foot: Secondary | ICD-10-CM | POA: Diagnosis not present

## 2021-08-14 MED ORDER — TRIAMCINOLONE ACETONIDE 10 MG/ML IJ SUSP
20.0000 mg | Freq: Once | INTRAMUSCULAR | Status: AC
  Administered 2021-08-14: 20 mg

## 2021-08-14 NOTE — Progress Notes (Signed)
Subjective:   Patient ID: [name] [name], female   DOB: 35 y.o.   MRN: [id number]   HPI Patient states she is developed pain in the right ankle and to the outside of the foot with both problems appearing to be separate.  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  Patient will be seen back as needed for treatment

## 2021-12-11 ENCOUNTER — Ambulatory Visit: Payer: Managed Care, Other (non HMO) | Admitting: Plastic Surgery

## 2021-12-11 ENCOUNTER — Other Ambulatory Visit: Payer: Self-pay

## 2021-12-11 VITALS — BP 107/67 | HR 84 | Ht 61.0 in | Wt 196.8 lb

## 2021-12-11 DIAGNOSIS — L730 Acne keloid: Secondary | ICD-10-CM

## 2021-12-12 ENCOUNTER — Encounter: Payer: Self-pay | Admitting: Plastic Surgery

## 2021-12-12 NOTE — Progress Notes (Signed)
° °  Referring Provider Wynona Canes, MD [address] [place],  [place] [address]   CC:  Acne scarring  [name] [name] is an 36 y.o. female.  HPI: Patient is a 36 year old who has concerns of acne scarring.  She thinks she has contour regularity as well as areas of hyperpigmentation.  She is interested in a procedure for treatment of acne.  She does not have a complex skin regimen with time.  No Known Allergies  Outpatient Encounter Medications as of 12/11/2021  Medication Sig   Multiple Vitamins-Minerals (ALIVE ONCE DAILY WOMENS PO) Take 1 tablet by mouth daily.   [DISCONTINUED] diclofenac (VOLTAREN) 75 MG EC tablet Take 1 tablet (75 mg total) by mouth 2 (two) times daily.   [DISCONTINUED] ibuprofen (ADVIL) 800 MG tablet Take 1 tablet (800 mg total) by mouth every 8 (eight) hours as needed.   [DISCONTINUED] predniSONE (DELTASONE) 10 MG tablet 12 day tapering dose   [DISCONTINUED] predniSONE (STERAPRED UNI-PAK 48 TAB) 10 MG (48) TBPK tablet See admin instructions.   No facility-administered encounter medications on file as of 12/11/2021.     Past Medical History:  Diagnosis Date   Abnormal Pap smear of cervix     teenager,  repeat pap smear   Gestational diabetes    Headache(784.0)    tension only   Heart murmur    no treatment for as child    Past Surgical History:  Procedure Laterality Date   BUNIONECTOMY  2005   DILATION AND CURETTAGE OF UTERUS      Family History  Problem Relation Age of Onset   Hypertension Mother    Thyroid disease Mother    Hypertension Maternal Grandmother     Social History   Social History Narrative   Not on file     Review of Systems General: Denies fevers, chills, weight loss CV: Denies chest pain, shortness of breath, palpitations   Physical Exam Vitals with BMI 12/11/2021 11/21/2020 10/24/2020  Height 5\' 1"  5\' 1"  5\' 1"   Weight 196 lbs 13 oz 194 lbs 194 lbs  BMI 37.2 36.67 36.67  Systolic 107 118  Diastolic 67 70 72  Pulse  84 - 76    General:  No acute distress,  Alert and oriented, Non-Toxic, Normal speech and affect Skin: Patient has some cobblestoning and acne scars of varying sizes.  No active acne  Assessment/Plan I think for the pigmentation that hydroquinone could be of benefit to the patient.  Resurfacing laser could be considered but she has some skin pigment so we have to worry about postinflammatory hyperpigmentation.  We will continue to discuss options.

## 2022-02-26 ENCOUNTER — Ambulatory Visit: Payer: Managed Care, Other (non HMO) | Admitting: Podiatry

## 2022-03-05 ENCOUNTER — Ambulatory Visit (INDEPENDENT_AMBULATORY_CARE_PROVIDER_SITE_OTHER): Payer: Managed Care, Other (non HMO)

## 2022-03-05 ENCOUNTER — Ambulatory Visit (INDEPENDENT_AMBULATORY_CARE_PROVIDER_SITE_OTHER): Payer: Managed Care, Other (non HMO) | Admitting: Podiatry

## 2022-03-05 ENCOUNTER — Encounter: Payer: Self-pay | Admitting: Podiatry

## 2022-03-05 DIAGNOSIS — M7671 Peroneal tendinitis, right leg: Secondary | ICD-10-CM | POA: Diagnosis not present

## 2022-03-05 DIAGNOSIS — M7751 Other enthesopathy of right foot: Secondary | ICD-10-CM | POA: Diagnosis not present

## 2022-03-05 MED ORDER — TRIAMCINOLONE ACETONIDE 10 MG/ML IJ SUSP
20.0000 mg | Freq: Once | INTRAMUSCULAR | Status: AC
  Administered 2022-03-05: 20 mg

## 2022-03-07 NOTE — Progress Notes (Signed)
Subjective:  ? ?Patient ID: [name] [name], female   DOB: 36 y.o.   MRN: [id number]  ? ?HPI ?Patient states she is feeling better in the joint but still having some discomfort in the outside of her foot does have a small white spot but that is not that where the tenderness comes from ? ? ?ROS ? ? ?   ?Objective:  ?Physical Exam  ?Continue peroneal tendinitis right with inflammation noted improvement of sinus tarsitis right ? ?   ?Assessment:  ?Peroneal tendinitis right ? ?   ?Plan:  ?Reviewed condition went to a different place where there is inflammation did a careful sheath injection 3 mg Dexasone Kenalog 5 mg Xylocaine and discussed reduced activity ice therapy and if symptoms continue will be seen back ?   ? ? ?

## 2022-04-13 ENCOUNTER — Emergency Department (HOSPITAL_BASED_OUTPATIENT_CLINIC_OR_DEPARTMENT_OTHER): Payer: Managed Care, Other (non HMO)

## 2022-04-13 ENCOUNTER — Emergency Department (HOSPITAL_COMMUNITY): Payer: Managed Care, Other (non HMO)

## 2022-04-13 ENCOUNTER — Other Ambulatory Visit: Payer: Self-pay

## 2022-04-13 ENCOUNTER — Encounter (HOSPITAL_BASED_OUTPATIENT_CLINIC_OR_DEPARTMENT_OTHER): Payer: Self-pay | Admitting: Emergency Medicine

## 2022-04-13 ENCOUNTER — Emergency Department (HOSPITAL_BASED_OUTPATIENT_CLINIC_OR_DEPARTMENT_OTHER)
Admission: EM | Admit: 2022-04-13 | Discharge: 2022-04-13 | Disposition: A | Discharge status: Home / Self Care | Payer: Managed Care, Other (non HMO) | Attending: Emergency Medicine | Admitting: Emergency Medicine

## 2022-04-13 DIAGNOSIS — G43809 Other migraine, not intractable, without status migrainosus: Secondary | ICD-10-CM

## 2022-04-13 DIAGNOSIS — Z20822 Contact with and (suspected) exposure to covid-19: Secondary | ICD-10-CM | POA: Diagnosis not present

## 2022-04-13 DIAGNOSIS — R202 Paresthesia of skin: Secondary | ICD-10-CM | POA: Diagnosis not present

## 2022-04-13 DIAGNOSIS — R200 Anesthesia of skin: Secondary | ICD-10-CM | POA: Insufficient documentation

## 2022-04-13 DIAGNOSIS — Y900 Blood alcohol level of less than 20 mg/100 ml: Secondary | ICD-10-CM | POA: Insufficient documentation

## 2022-04-13 DIAGNOSIS — R531 Weakness: Secondary | ICD-10-CM | POA: Diagnosis not present

## 2022-04-13 LAB — CBG MONITORING, ED: Glucose-Capillary: 113 mg/dL — ABNORMAL HIGH (ref 70–99)

## 2022-04-13 LAB — PROTIME-INR
INR: 0.9 (ref 0.8–1.2)
Prothrombin Time: 12.3 seconds (ref 11.4–15.2)

## 2022-04-13 LAB — COMPREHENSIVE METABOLIC PANEL
ALT: 41 U/L (ref 0–44)
AST: 24 U/L (ref 15–41)
Albumin: 3.9 g/dL (ref 3.5–5.0)
Alkaline Phosphatase: 57 U/L (ref 38–126)
Anion gap: 8 (ref 5–15)
BUN: 9 mg/dL (ref 6–20)
CO2: 26 mmol/L (ref 22–32)
Calcium: 8.7 mg/dL — ABNORMAL LOW (ref 8.9–10.3)
Chloride: 104 mmol/L (ref 98–111)
Creatinine, Ser: 0.8 mg/dL (ref 0.44–1.00)
GFR, Estimated: >60 mL/min (ref >60)
Glucose, Bld: 102 mg/dL — ABNORMAL HIGH (ref 70–99)
Potassium: 3.7 mmol/L (ref 3.5–5.1)
Sodium: 138 mmol/L (ref 135–145)
Total Bilirubin: 0.5 mg/dL (ref 0.3–1.2)
Total Protein: 7.2 g/dL (ref 6.5–8.1)

## 2022-04-13 LAB — CBC WITH DIFFERENTIAL/PLATELET
Abs Immature Granulocytes: 0.02 10*3/uL (ref 0.00–0.07)
Basophils Absolute: 0 10*3/uL (ref 0.0–0.1)
Basophils Relative: 0 %
Eosinophils Absolute: 0 10*3/uL (ref 0.0–0.5)
Eosinophils Relative: 0 %
HCT: 35.1 % — ABNORMAL LOW (ref 36.0–46.0)
Hemoglobin: 11.9 g/dL — ABNORMAL LOW (ref 12.0–15.0)
Immature Granulocytes: 0 %
Lymphocytes Relative: 33 %
Lymphs Abs: 2.4 10*3/uL (ref 0.7–4.0)
MCH: 28.7 pg (ref 26.0–34.0)
MCHC: 33.9 g/dL (ref 30.0–36.0)
MCV: 84.8 fL (ref 80.0–100.0)
Monocytes Absolute: 0.5 10*3/uL (ref 0.1–1.0)
Monocytes Relative: 7 %
Neutro Abs: 4.3 10*3/uL (ref 1.7–7.7)
Neutrophils Relative %: 60 %
Platelets: 323 10*3/uL (ref 150–400)
RBC: 4.14 MIL/uL (ref 3.87–5.11)
RDW: 12.9 % (ref 11.5–15.5)
WBC: 7.3 10*3/uL (ref 4.0–10.5)
nRBC: 0 % (ref 0.0–0.2)

## 2022-04-13 LAB — ETHANOL: Alcohol, Ethyl (B): <10 mg/dL (ref <10)

## 2022-04-13 LAB — RESP PANEL BY RT-PCR (FLU A&B, COVID) ARPGX2
Influenza A by PCR: NEGATIVE
Influenza B by PCR: NEGATIVE
SARS Coronavirus 2 by RT PCR: NEGATIVE

## 2022-04-13 LAB — MAGNESIUM: Magnesium: 1.7 mg/dL (ref 1.7–2.4)

## 2022-04-13 LAB — APTT: aPTT: 25 seconds (ref 24–36)

## 2022-04-13 IMAGING — CT CT HEAD CODE STROKE
3 series · 15 of 47 positions shown, 18 images · non-contrast
Comparison: None Available.

CLINICAL DATA: Code stroke. Initial evaluation for neuro deficit,
stroke suspected.



[Series 3: head wo · axial · 0.44mm/px · z∈[+1009,+1134]mm · 9 of 30 slices shown, 12 images]
[im 3/30  brain]
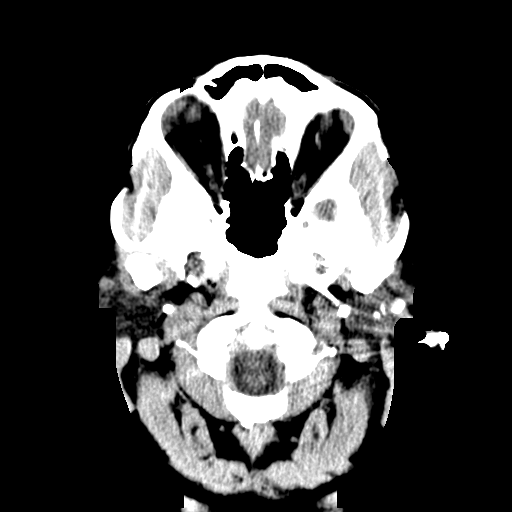
[im 3/30  bone]
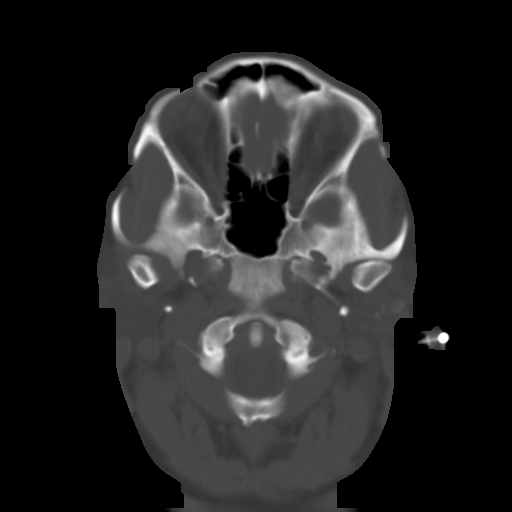
[im 6/30  brain]
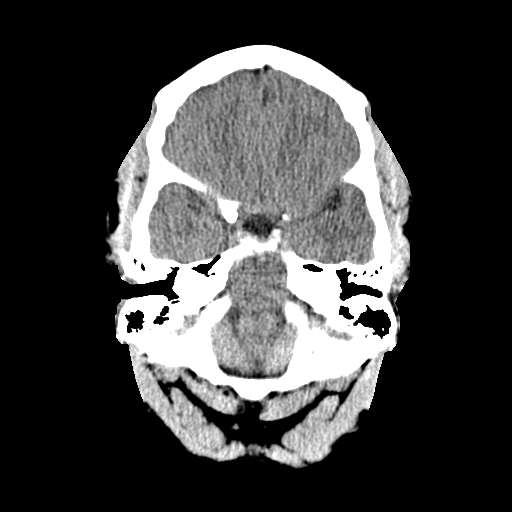
[im 9/30  brain]
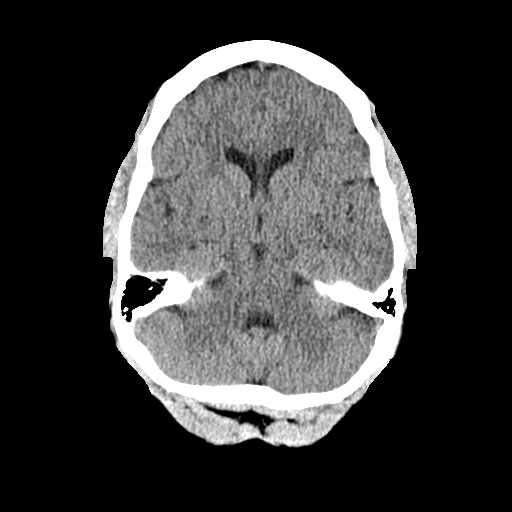
[im 12/30  brain]
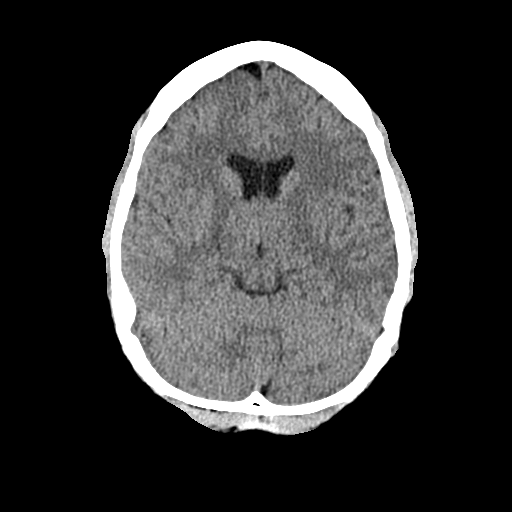
[im 16/30  brain]
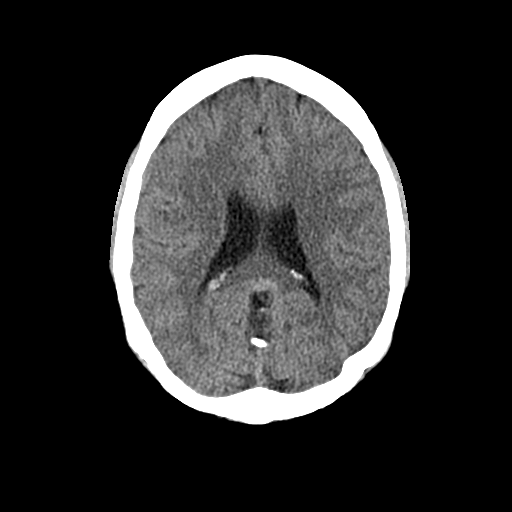
[im 16/30  bone]
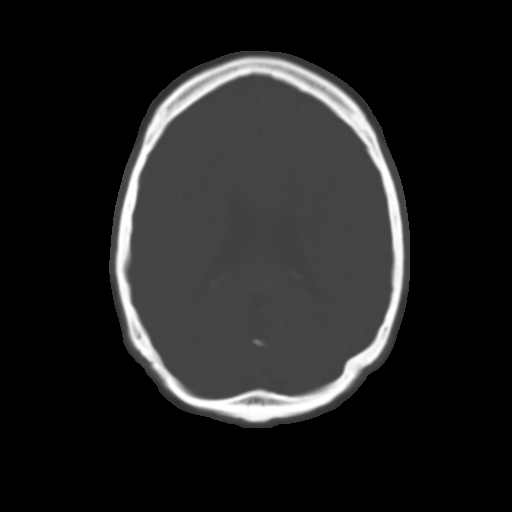
[im 19/30  brain]
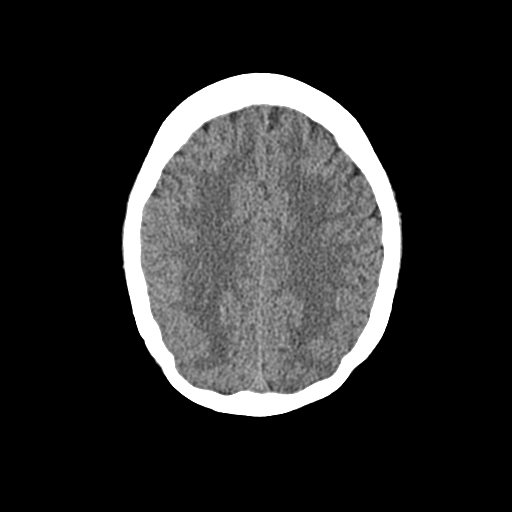
[im 22/30  brain]
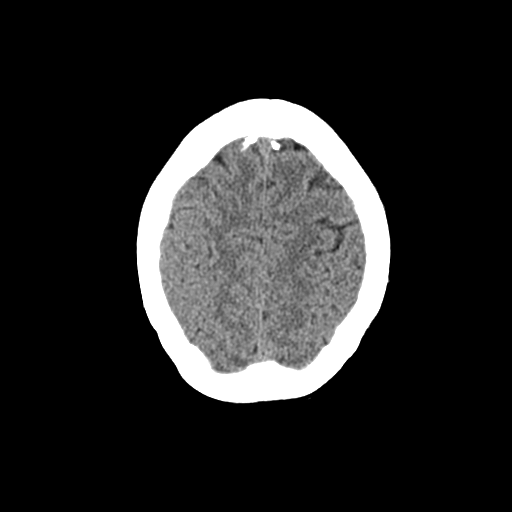
[im 25/30  brain]
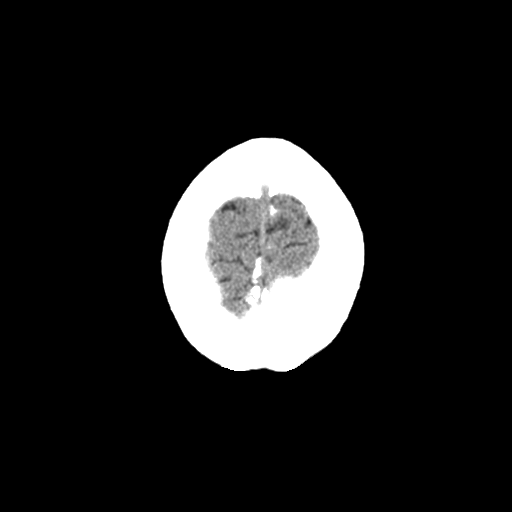
[im 28/30  brain]
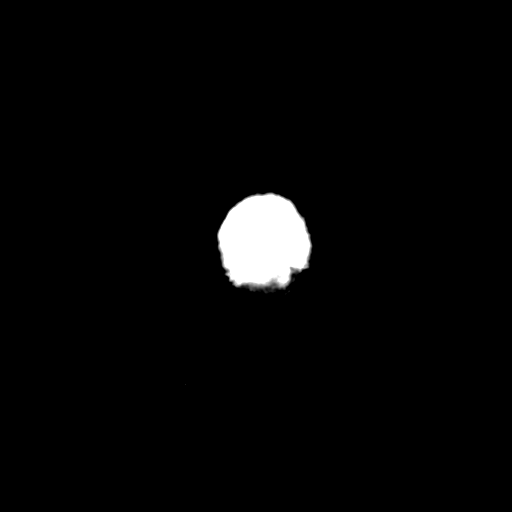
[im 28/30  bone]
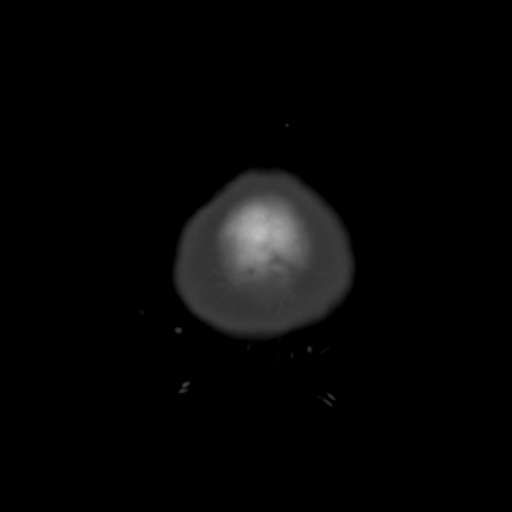

[Series 5: cor soft · coronal · 0.31mm/px · 3 of 61 slices shown]
[im 21/61  brain]
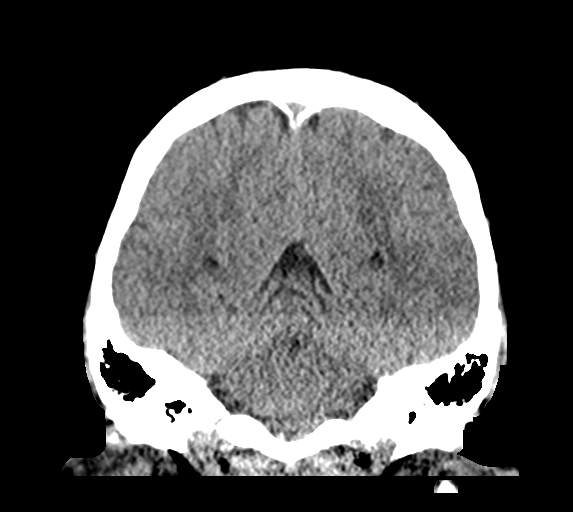
[im 27/61  brain]
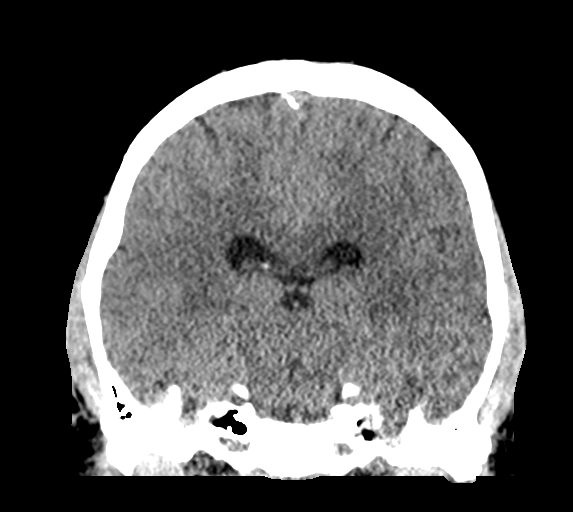
[im 34/61  brain]
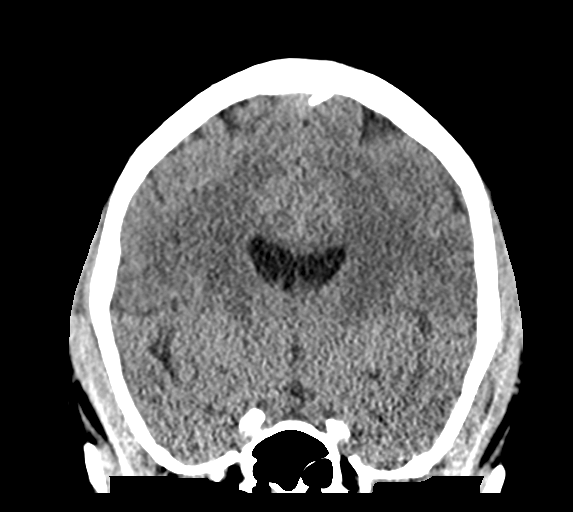

[Series 6: sag soft · sagittal · 0.31mm/px · 3 of 53 slices shown]
[im 18/53  brain]
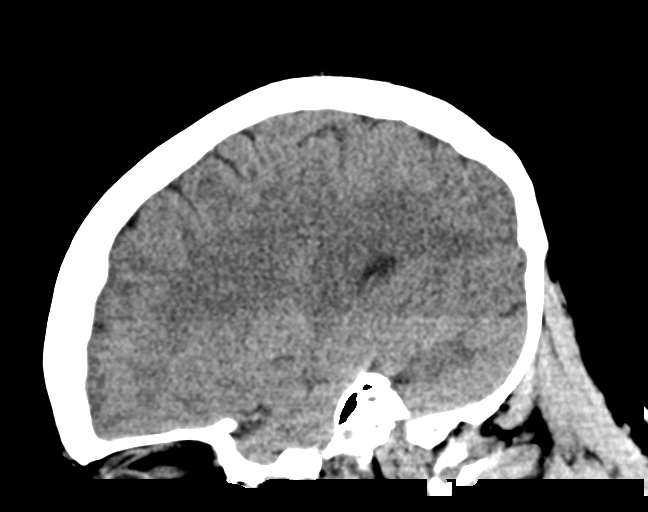
[im 27/53  brain]
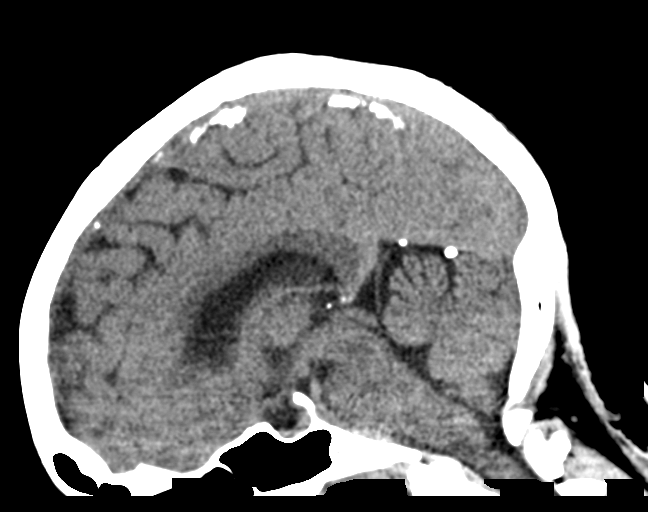
[im 35/53  brain]
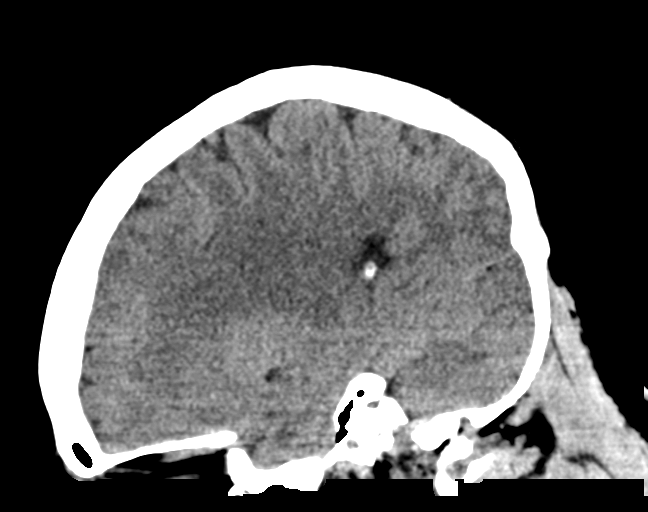

[15 of 47 positions shown; findings below may reference images not displayed]

FINDINGS: Brain: Cerebral volume within normal limits for patient age.

No evidence for acute intracranial hemorrhage. No findings to
suggest acute large vessel territory infarct. No mass lesion,
midline shift, or mass effect. Ventricles are normal in size without
evidence for hydrocephalus. No extra-axial fluid collection
identified.

Vascular: No hyperdense vessel identified.

Skull: Scalp soft tissues demonstrate no acute abnormality.
Calvarium intact.

Sinuses/Orbits: Globes and orbital soft tissues within normal
limits.

Visualized paranasal sinuses are clear. No mastoid effusion.

ASPECTS (Alberta Stroke Program Early CT Score)

- Ganglionic level infarction (caudate, lentiform nuclei, internal
capsule, insula, M1-M3 cortex): 7

- Supraganglionic infarction (M4-M6 cortex): 3

Total score (0-10 with 10 being normal): 10
IMPRESSION: 1. Negative head CT.  No acute intracranial abnormality.
2. ASPECTS is 10.

Critical Value/emergent results were called by telephone at the time
of interpretation on [DATE] at [DATE] to provider AVELAR ,
who verbally acknowledged these results.

## 2022-04-13 IMAGING — MR MR HEAD W/O CM
12 of 13 series · 43 of 48 positions shown · non-contrast
Comparison: Head CT from earlier today

CLINICAL DATA: Left-sided numbness

EXAM:
MRI HEAD WITHOUT CONTRAST
TECHNIQUE: Multiplanar, multiecho pulse sequences of the brain and surrounding
structures were obtained without intravenous contrast.

[Series 5: DWI · axial · 3.0mm · 0.88mm/px · z∈[-156,-25]mm · 6 of 96 slices shown (1 of 4)]
[im 1/96]
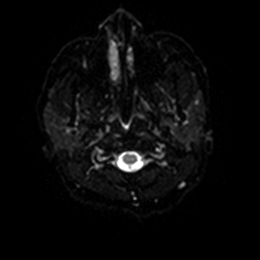
[im 20/96]
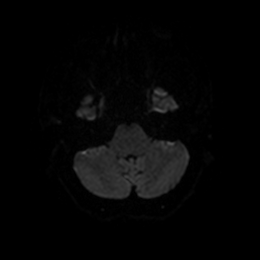
[im 39/96]
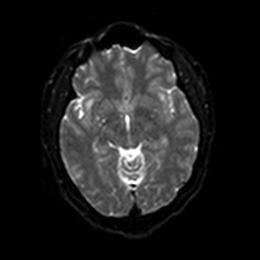
[im 58/96]
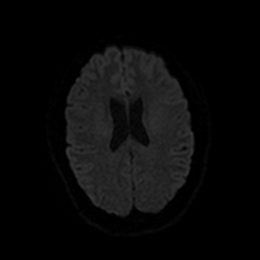
[im 77/96]
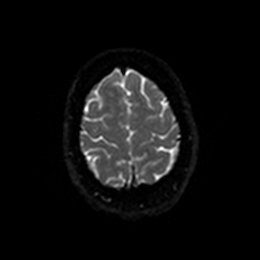
[im 96/96]
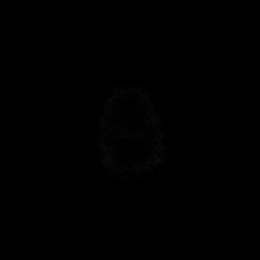

[Series 6: DWI · axial · 3.0mm · 0.88mm/px · z∈[-156,-25]mm · 4 of 48 slices shown (2 of 4)]
[im 1/48]
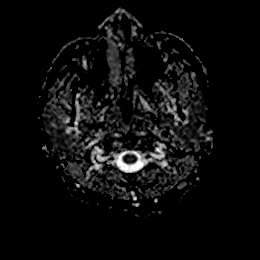
[im 16/48]
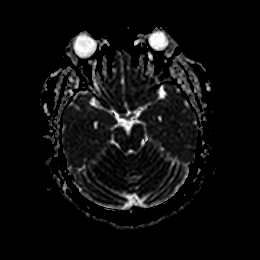
[im 32/48]
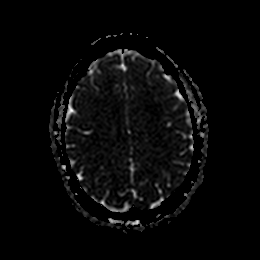
[im 48/48]
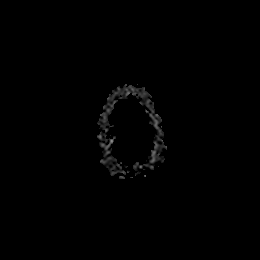

[Series 7: DWI · coronal · 4.0mm · 0.88mm/px · 5 of 64 slices shown (3 of 4)]
[im 1/64]
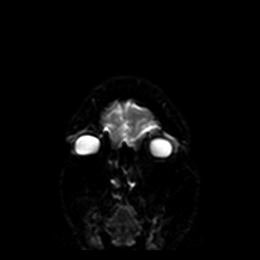
[im 16/64]
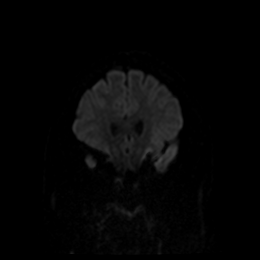
[im 32/64]
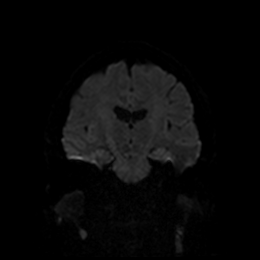
[im 48/64]
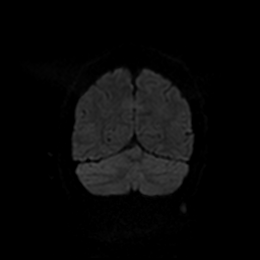
[im 64/64]
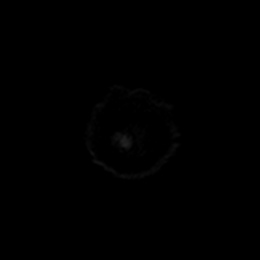

[Series 8: DWI · coronal · 4.0mm · 0.88mm/px · 2 of 32 slices shown (4 of 4)]
[im 1/32]
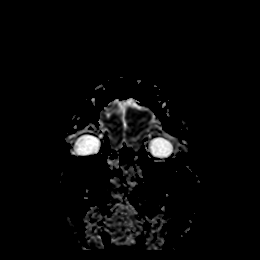
[im 32/32]
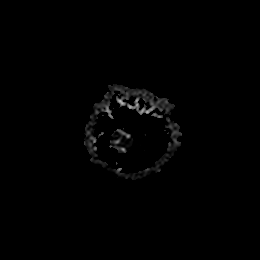

[Series 9: T1 · sagittal · 5.0mm · 0.75mm/px · 2 of 23 slices shown]
[im 1/23]
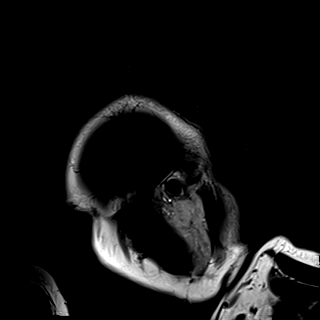
[im 23/23]
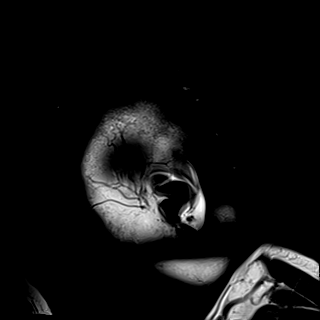

[Series 10: T2 · axial · 5.0mm · 0.72mm/px · z∈[-166,-42]mm · 2 of 25 slices shown (1 of 2)]
[im 1/25]
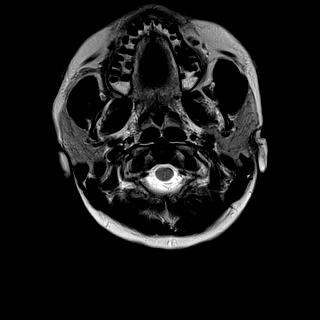
[im 25/25]
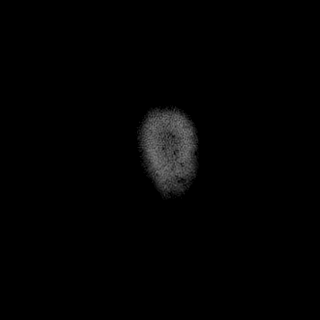

[Series 11: FLAIR · axial · 5.0mm · 0.45mm/px · z∈[-165,-42]mm · 2 of 25 slices shown]
[im 1/25]
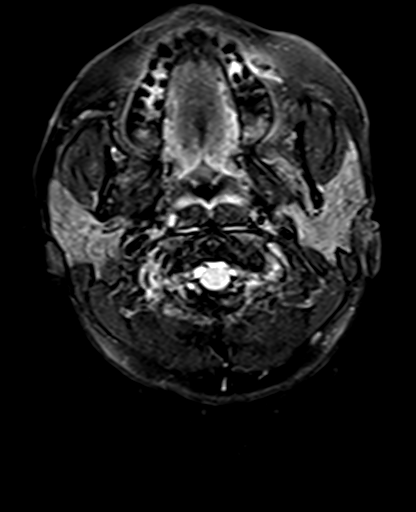
[im 25/25]
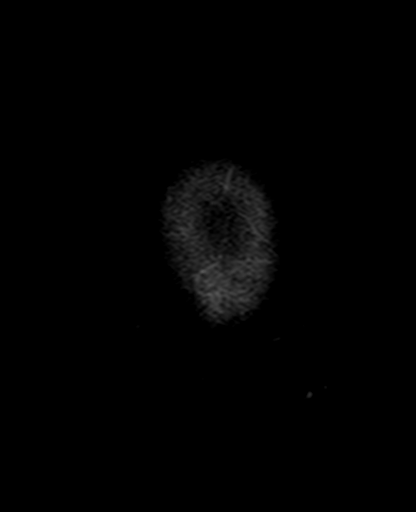

[Series 12: mag_images · axial · 3.0mm · 0.90mm/px · z∈[-166,+2]mm · 5 of 60 slices shown]
[im 1/60]
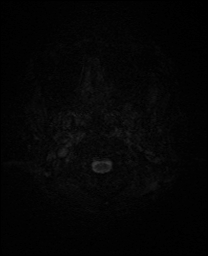
[im 15/60]
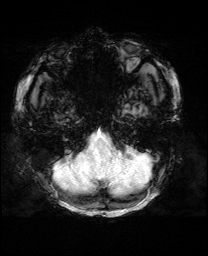
[im 30/60]
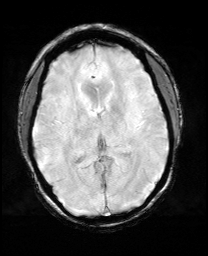
[im 45/60]
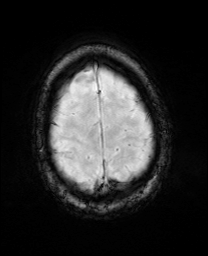
[im 60/60]
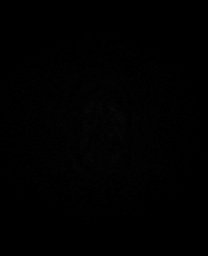

[Series 13: pha_images · axial · 3.0mm · 0.90mm/px · z∈[-166,-3]mm · 4 of 58 slices shown]
[im 1/58]
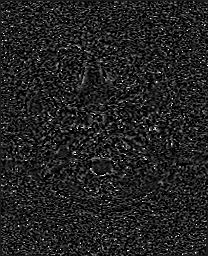
[im 20/58]
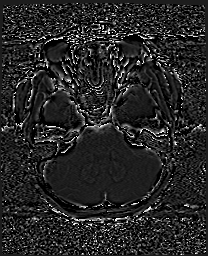
[im 39/58]
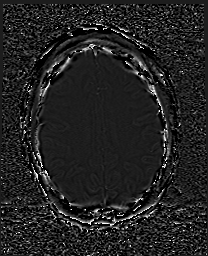
[im 58/58]
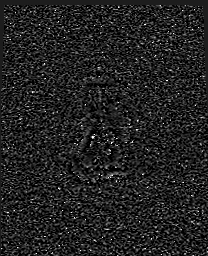

[Series 14: swi_images · axial · 3.0mm · 0.90mm/px · z∈[-166,+2]mm · 5 of 60 slices shown]
[im 1/60]
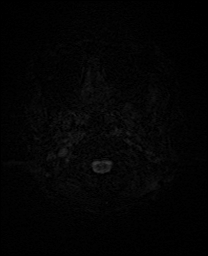
[im 15/60]
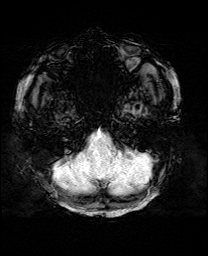
[im 30/60]
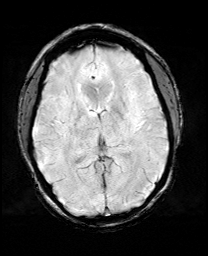
[im 45/60]
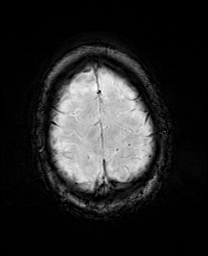
[im 60/60]
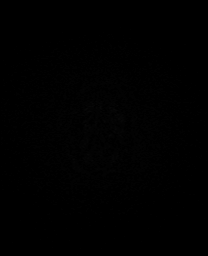

[Series 15: mip_images(sw) · axial · 24.0mm · 0.90mm/px · z∈[-156,-8]mm · 4 of 53 slices shown]
[im 1/53]
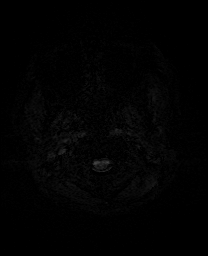
[im 18/53]
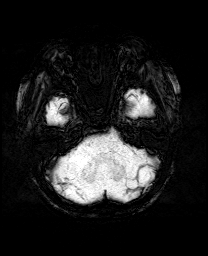
[im 35/53]
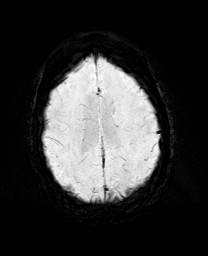
[im 53/53]
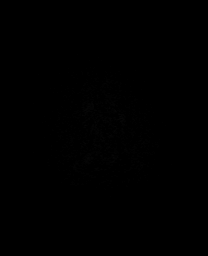

[Series 17: T2 · coronal · 5.0mm · 0.34mm/px · 2 of 29 slices shown (2 of 2)]
[im 1/29]
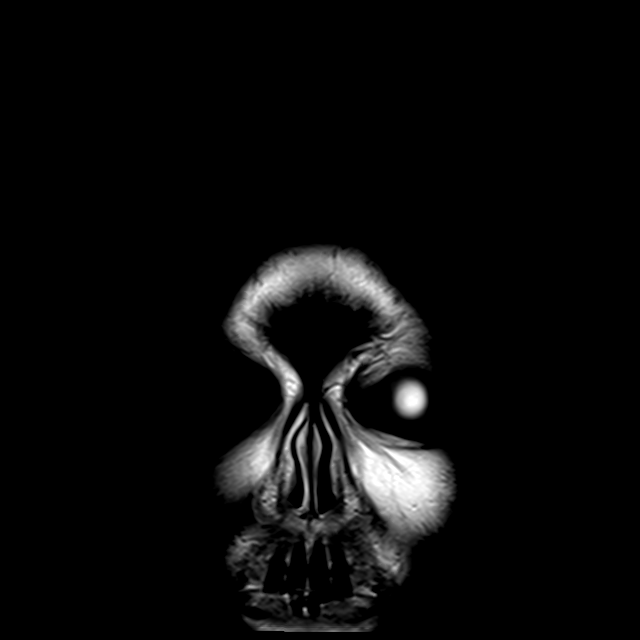
[im 29/29]
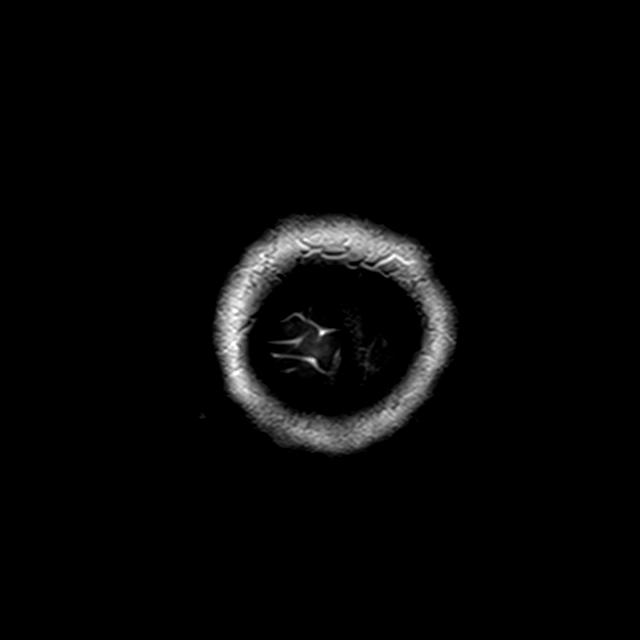

[43 of 48 positions shown; findings below may reference images not displayed]

FINDINGS: Brain: No acute infarction, hemorrhage, hydrocephalus, extra-axial
collection or mass lesion. Negative for white matter disease or
atrophy.

Vascular: Normal flow voids

Skull and upper cervical spine: Normal marrow signal

Sinuses/Orbits: Negative
IMPRESSION: Normal MRI of the brain.

## 2022-04-13 MED ORDER — PROCHLORPERAZINE EDISYLATE 10 MG/2ML IJ SOLN
10.0000 mg | Freq: Once | INTRAMUSCULAR | Status: AC
  Administered 2022-04-13: 10 mg via INTRAVENOUS
  Filled 2022-04-13: qty 2

## 2022-04-13 MED ORDER — KETOROLAC TROMETHAMINE 30 MG/ML IJ SOLN
30.0000 mg | Freq: Once | INTRAMUSCULAR | Status: AC
  Administered 2022-04-13: 30 mg via INTRAVENOUS
  Filled 2022-04-13: qty 1

## 2022-04-13 MED ORDER — DIPHENHYDRAMINE HCL 50 MG/ML IJ SOLN
12.5000 mg | Freq: Once | INTRAMUSCULAR | Status: AC
  Administered 2022-04-13: 12.5 mg via INTRAVENOUS
  Filled 2022-04-13: qty 1

## 2022-04-13 NOTE — ED Triage Notes (Addendum)
Pt reports after laying down she started experiencing left sided numbness and left sided lower back pain. Pt states she just started phentermine and feels palpitations. Pt noted to be carrying wallet and cell phone in left hand without difficutly ?

## 2022-04-13 NOTE — ED Notes (Signed)
Patient is mr for a scan ?

## 2022-04-13 NOTE — Consult Note (Signed)
[facility] ?[facility] Consult [facility] ? ? ?Patient Name:   [name], [name] ?Date of Birth:   22-Nov-1986 ?Identification Number:   MRN - [id number] ?Date of Service:   04/13/2022 00:56:11 ? ?Diagnosis: ?      R44.8 - Other and unspecified symptoms and signs involving general sensations and perceptions ? ?Impression: ?     [name] [name] is a 36 year-old woman with a PMH of HLD, Pre-DM who presents with left sided numbness. TNK was not given as she has no disabling symptoms. ? ?Our recommendations are outlined below. ? ?Recommendations: ? ?      Stroke/Telemetry Floor ?      Neuro Checks ?      Bedside Swallow Eval ?      DVT Prophylaxis ?      IV Fluids, Normal Saline ?      Head of Bed 30 Degrees ?      Euglycemia and Avoid Hyperthermia (PRN Acetaminophen) ?      Initiate or continue Aspirin 325 MG daily ?      Antihypertensives PRN if Blood pressure is greater than 220/120 or there is a concern for End organ damage/contraindications for permissive HTN. If blood pressure is greater than 220/120 give labetalol PO or IV or Vasotec IV with a goal of 15% reduction in BP during the first 24 hours. ?      - MRI Brain without contrast ?      - Carotid Dopplers ?      - TTE ?      - Labs: lipid panel; HgbA1c ?      - TTE ?      - PT/OT/ST where applicable ? ? ?Sign Out: ?      Discussed with Emergency Department Provider ? ? ? ?------------------------------------------------------------------------------ ? ?Advanced Imaging: ?Advanced Imaging Deferred because: ? ?Non-disabling symptoms as verified by the patient; no cortical signs so not consistent with LVO ? ? ?Metrics: ?Last Known Well: 04/12/2022 22:30:00 ?[facility] Notification Time: 04/13/2022 00:56:11 ?Arrival Time: 04/13/2022 00:09:00 ?Stamp Time: 04/13/2022 00:56:11 ?Initial Response Time: 04/13/2022 00:58:16 ?Symptoms: Left sided numbness. ?Initial patient interaction: 04/13/2022 01:00:01 ?NIHSS Assessment Completed: 04/13/2022  01:01:40 ?Patient is not a candidate for Thrombolytic. ?Thrombolytic Medical Decision: 04/13/2022 01:03:38 ?Patient was not deemed candidate for Thrombolytic because of following reasons: ?No disabling symptoms. ? ?I personally Reviewed the CT Head and it Showed no ICH ? ?Primary Provider Notified of Diagnostic Impression and Management Plan on: 04/13/2022 01:26:07 ? ? ? ?------------------------------------------------------------------------------ ? ?History of Present Illness: ?Patient is a 36 year old Female. ? ?Patient was brought by private transportation with symptoms of Left sided numbness. ?[name] [name] is a 36 year-old woman with a PMH of HLD, Pre-DM who presents with left sided numbness. She reports that she was laying in bed watching TV when her left side went numb. She says that she got up to go to the bathroom and had difficulty walking. She also says that she had palpitations. [name] [name] denies any improvement in her symptoms. She also denies ever having had symptoms like this before. ? ? ?Past Medical History: ?     Hyperlipidemia ?     There is no history of Hypertension ?     There is no history of Diabetes Mellitus ?     There is no history of Atrial Fibrillation ?     There is no history of Coronary Artery Disease ?     There is no history of Stroke ?  There is no history of Seizures ? ?Medications: ? ?No Anticoagulant use  ?No Antiplatelet use ?Reviewed EMR for current medications ? ?Allergies:  ?NKDA ? ?Social History: ?Smoking: No ?Drug Use: No ? ?Family History: ? ?There is no family history of premature cerebrovascular disease pertinent to this consultation ? ?ROS : ?14 Points Review of Systems was performed and was negative except mentioned in HPI. ? ?Past Surgical History: ?There Is No Surgical History Contributory To Today?s Visit ? ? ? ?Examination: ?BP(137/93), Pulse(73), ?1A: Level of Consciousness - Alert; keenly responsive + 0 ?1B: Ask Month and Age - Both Questions Right + 0 ?1C: Blink  Eyes & Squeeze Hands - Performs Both Tasks + 0 ?2: Test Horizontal Extraocular Movements - Normal + 0 ?3: Test Visual Fields - No Visual Loss + 0 ?4: Test Facial Palsy (Use Grimace if Obtunded) - Normal symmetry + 0 ?5A: Test Left Arm Motor Drift - No Drift for 10 Seconds + 0 ?5B: Test Right Arm Motor Drift - No Drift for 10 Seconds + 0 ?6A: Test Left Leg Motor Drift - No Drift for 5 Seconds + 0 ?6B: Test Right Leg Motor Drift - No Drift for 5 Seconds + 0 ?7: Test Limb Ataxia (FNF/Heel-Shin) - No Ataxia + 0 ?8: Test Sensation - Mild-Moderate Loss: Less Sharp/More Dull + 1 ?9: Test Language/Aphasia - Normal; No aphasia + 0 ?10: Test Dysarthria - Normal + 0 ?11: Test Extinction/Inattention - No abnormality + 0 ? ?NIHSS Score: 1 ? ? ?Pre-Morbid Modified Rankin Scale: ?0 Points = No symptoms at all ? ? ?Patient/Family was informed the Neurology Consult would occur via TeleHealth consult by way of interactive audio and video telecommunications and consented to receiving care in this manner. ? ? ?Patient is being evaluated for possible acute neurologic impairment and high probability of imminent or life-threatening deterioration. I spent total of 35 minutes providing care to this patient, including time for face to face visit via telemedicine, review of medical records, imaging studies and discussion of findings with providers, the patient and/or family. ? ? ?Dr Wilford Sports ? ? ?[facility] ?[phone] ? ? ?Case [id number] ? ?

## 2022-04-13 NOTE — ED Provider Notes (Signed)
?[facility] EMERGENCY DEPARTMENT ?Provider Note ? ? ?CSN: [id number] ?Arrival date & time: 04/13/22  0001 ? ?An emergency department physician performed an initial assessment on this suspected stroke patient at [facility] (called teleneuro). ? ?History ? ?Chief Complaint  ?Patient presents with  ? Numbness  ? ? ?[name] [name] is a 36 y.o. female. ? ?36 yo F with a chief complaints of sudden onset numbness and weakness to her left side.  She was laying back to watch TV and about 11 PM realized that her left side felt a little bit tingly.  She got up to try and walk and felt like she had trouble properly maneuvering her left leg.  She then decided to come to the hospital for evaluation.  She has recently started phentermine for weight loss.  Had denied any symptoms with starting that other than feeling like her heart was racing on her off and on. ? ? ? ?  ? ?Home Medications ?Prior to Admission medications   ?Medication Sig Start Date End Date Taking? Authorizing Provider  ?baclofen (OZOBAX) 5 MG/5ML SOLN Take 10 mg by mouth 3 (three) times daily as needed. 03/06/22   [provider]  ?diclofenac (VOLTAREN) 50 MG EC tablet Take 50 mg by mouth 2 (two) times daily. 02/13/22   [provider]  ?ergocalciferol (VITAMIN D2) 1.25 MG (50000 UT) capsule Take by mouth.    [provider]  ?Multiple Vitamins-Minerals (ALIVE ONCE DAILY WOMENS PO) Take 1 tablet by mouth daily.    [provider]  ?   ? ?Allergies    ?Patient has no known allergies.   ? ?Review of Systems   ?Review of Systems ? ?Physical Exam ?Updated Vital Signs ?BP (!) 140/93   Pulse 70   Temp 98.3 ?F (36.8 ?C) (Oral)   Resp 20   Ht 5\' 1"  (1.549 m)   Wt 87.5 kg   LMP 04/02/2022   SpO2 100%   BMI 36.47 kg/m?  ?Physical Exam ?Vitals and nursing note reviewed.  ?Constitutional:   ?   General: She is not in acute distress. ?   Appearance: She is well-developed. She is not diaphoretic.  ?HENT:  ?   Head: Normocephalic and  atraumatic.  ?Eyes:  ?   Pupils: Pupils are equal, round, and reactive to light.  ?Cardiovascular:  ?   Rate and Rhythm: Normal rate and regular rhythm.  ?   Heart sounds: No murmur heard. ?  No friction rub. No gallop.  ?Pulmonary:  ?   Effort: Pulmonary effort is normal.  ?   Breath sounds: No wheezing or rales.  ?Abdominal:  ?   General: There is no distension.  ?   Palpations: Abdomen is soft.  ?   Tenderness: There is no abdominal tenderness.  ?Musculoskeletal:     ?   General: No tenderness.  ?   Cervical back: Normal range of motion and neck supple.  ?Skin: ?   General: Skin is warm and dry.  ?Neurological:  ?   Mental Status: She is alert and oriented to person, place, and time.  ?   GCS: GCS eye subscore is 4. GCS verbal subscore is 5. GCS motor subscore is 6.  ?   Cranial Nerves: Cranial nerves 2-12 are intact.  ?   Sensory: Sensation is intact.  ?   Motor: Motor function is intact.  ?   Coordination: Coordination is intact.  ?   Gait: Gait abnormal.  ?  Comments: Painful appearing gait.  Subjective increase sensation to light touch to the left side of her face.  Otherwise benign exam.  ?Psychiatric:     ?   Behavior: Behavior normal.  ? ? ?ED Results / Procedures / Treatments   ?Labs ?(all labs ordered are listed, but only abnormal results are displayed) ?Labs Reviewed  ?CBC WITH DIFFERENTIAL/PLATELET - Abnormal; Notable for the following components:  ?    Result Value  ? Hemoglobin 11.9 (*)   ? HCT 35.1 (*)   ? All other components within normal limits  ?COMPREHENSIVE METABOLIC PANEL - Abnormal; Notable for the following components:  ? Glucose, Bld 102 (*)   ? Calcium 8.7 (*)   ? All other components within normal limits  ?CBG MONITORING, ED - Abnormal; Notable for the following components:  ? Glucose-Capillary 113 (*)   ? All other components within normal limits  ?RESP PANEL BY RT-PCR (FLU A&B, COVID) ARPGX2  ?MAGNESIUM  ?ETHANOL  ?PROTIME-INR  ?APTT  ?RAPID URINE DRUG SCREEN, HOSP PERFORMED   ?URINALYSIS, ROUTINE W REFLEX MICROSCOPIC  ?PREGNANCY, URINE  ? ? ?EKG ?EKG Interpretation ? ?Date/Time:  Friday Apr 13 2022 00:16:16 EDT ?Ventricular Rate:  79 ?PR Interval:  147 ?QRS Duration: 76 ?QT Interval:  355 ?QTC Calculation: 407 ?R Axis:   68 ?Text Interpretation: Sinus rhythm No significant change since last tracing Confirmed by Deno Etienne [phone]) on 04/13/2022 12:26:57 AM ? ?Radiology ?CT HEAD CODE STROKE WO CONTRAST ? ?Result Date: 04/13/2022 ?CLINICAL DATA:  Code stroke. Initial evaluation for neuro deficit, stroke suspected. EXAM: CT HEAD WITHOUT CONTRAST TECHNIQUE: Contiguous axial images were obtained from the base of the skull through the vertex without intravenous contrast. RADIATION DOSE REDUCTION: This exam was performed according to the departmental dose-optimization program which includes automated exposure control, adjustment of the mA and/or kV according to patient size and/or use of iterative reconstruction technique. COMPARISON:  None Available. FINDINGS: Brain: Cerebral volume within normal limits for patient age. No evidence for acute intracranial hemorrhage. No findings to suggest acute large vessel territory infarct. No mass lesion, midline shift, or mass effect. Ventricles are normal in size without evidence for hydrocephalus. No extra-axial fluid collection identified. Vascular: No hyperdense vessel identified. Skull: Scalp soft tissues demonstrate no acute abnormality. Calvarium intact. Sinuses/Orbits: Globes and orbital soft tissues within normal limits. Visualized paranasal sinuses are clear. No mastoid effusion. ASPECTS [facility] Stroke Program Early CT Score) - Ganglionic level infarction (caudate, lentiform nuclei, internal capsule, insula, M1-M3 cortex): 7 - Supraganglionic infarction (M4-M6 cortex): 3 Total score (0-10 with 10 being normal): 10 IMPRESSION: 1. Negative head CT.  No acute intracranial abnormality. 2. ASPECTS is 10. Critical Value/emergent results were called by  telephone at the time of interpretation on 04/13/2022 at 1:06 am to provider Zailyn Thoennes , who verbally acknowledged these results. Electronically Signed   By: Jeannine Boga M.D.   On: 04/13/2022 01:06   ? ?Procedures ?Procedures  ? ? ?Medications Ordered in ED ?Medications - No data to display ? ?ED Course/ Medical Decision Making/ A&P ?  ?                        ?Medical Decision Making ?Amount and/or Complexity of Data Reviewed ?Labs: ordered. ?Radiology: ordered. ? ? ?36 yo F with a chief complaints of sudden onset numbness and weakness to the left side of her body.  This started about an hour and a half ago.  No obvious neurologic deficits  on exam with exception of when she gets up to try and walk.  Will discuss with neurology.  CT of the head blood work. ? ?On my record review there do seem to be a couple of case reports of people on phentermine that developed one-sided paresthesias. ? ?I discussed case with Dr. Lorrin Goodell, based on my history and physical exam he recommended acting a teleneurology code stroke. ? ?Patient was seen by the teleneurologist.  Low NIHSS score did not recommend thrombolytics.  Recommended MRI.  I discussed case with Dr. Randal Buba who accepts the patient in ED to ED transfer. ? ?The patients results and plan were reviewed and discussed.   ?Any x-rays performed were independently reviewed by myself.  ? ?Differential diagnosis were considered with the presenting HPI. ? ?Medications - No data to display ? ?Vitals:  ? 04/13/22 0008 04/13/22 0030 04/13/22 0045 04/13/22 0100  ?BP: 129/80 132/82 (!) 137/93 (!) 140/93  ?Pulse: 73 80 70 70  ?Resp: 16 16 15 20   ?Temp: 98.3 ?F (36.8 ?C)     ?TempSrc: Oral     ?SpO2: 100% 100% 99% 100%  ?Weight:      ?Height:      ? ? ?Final diagnoses:  ?Numbness  ? ? ? ? ? ? ? ? ? ? ?Final Clinical Impression(s) / ED Diagnoses ?Final diagnoses:  ?Numbness  ? ? ?Rx / DC Orders ?ED Discharge Orders   ? ? None  ? ?  ? ? ?  ?Deno Etienne, DO ?04/13/22 0150 ? ?

## 2022-04-13 NOTE — ED Notes (Signed)
Pt in mri 

## 2022-04-13 NOTE — ED Provider Notes (Signed)
Patient was seen by Dr. Adela Lank.  Please see his note.  Patient was sent here for an MRI of the brain.  Radiology results reviewed.  MRI does not show any acute abnormality.  Teleneurologist evaluation recommended MRI, carotid Dopplers in addition to further evaluation.  Will discuss with neuro hospitalist here to see if patient is appropriate for further outpatient management versus need for admission. ? ?Case discussed with Dr. Selina Cooley, neurology.  Patient is overall low risk.  Patient is having a slight headache right now.  It is possible this could be a complex migraine.  We will proceed with migraine cocktail as patient states she does feel like she still has some numbness on the left side of her face arm and leg.  If emptying to improve then she can safely be discharged, otherwise we will plan on admission at that time. ? ?10:03 AM patient states she is feeling better after the migraine headache cocktail.  Headache has resolved.  The numbness symptom has also resolved.  At this time suspect symptoms may have been related to a complex migraine rather than stroke/TIA. ? ?Evaluation and diagnostic testing in the emergency department does not suggest an emergent condition requiring admission or immediate intervention beyond what has been performed at this time.  The patient is safe for discharge and has been instructed to return immediately for worsening symptoms, change in symptoms or any other concerns. ? ?  ?Linwood Dibbles, MD ?04/13/22 1004 ? ?

## 2022-04-13 NOTE — ED Notes (Signed)
Called to give report to [facility] ED charge without answer. Private message sent to return call.  ?

## 2022-04-13 NOTE — ED Notes (Signed)
Neurologist on stroke cart with MD. ?

## 2022-04-13 NOTE — Discharge Instructions (Signed)
The test today did not show any signs of stroke.  You most likely had a complex migraine causing your headache and numbness.  You can try over-the-counter medications if you develop recurrent headache symptoms.  Follow-up with your primary care doctor to be rechecked.   ?

## 2022-04-13 NOTE — ED Notes (Addendum)
Patient transported to CT with RN on monitor.

## 2022-04-13 NOTE — ED Notes (Signed)
Code stroke initiated on teleneuro. ?

## 2022-04-13 NOTE — Progress Notes (Signed)
Code stroke activated at 0045. Pt to CT at 0050 and back at 0056.  Dr Grandville Silos on screen at [phone] ?

## 2022-08-22 ENCOUNTER — Ambulatory Visit: Payer: Managed Care, Other (non HMO) | Admitting: Podiatry

## 2022-09-12 ENCOUNTER — Other Ambulatory Visit (HOSPITAL_COMMUNITY)
Admission: RE | Admit: 2022-09-12 | Discharge: 2022-09-12 | Disposition: A | Discharge status: Home / Self Care | Payer: 59 | Source: Ambulatory Visit | Attending: Nurse Practitioner | Admitting: Nurse Practitioner

## 2022-09-12 DIAGNOSIS — R635 Abnormal weight gain: Secondary | ICD-10-CM | POA: Insufficient documentation

## 2022-09-12 LAB — COMPREHENSIVE METABOLIC PANEL
ALT: 42 U/L (ref 0–44)
AST: 28 U/L (ref 15–41)
Albumin: 4.5 g/dL (ref 3.5–5.0)
Alkaline Phosphatase: 48 U/L (ref 38–126)
Anion gap: 7 (ref 5–15)
BUN: 12 mg/dL (ref 6–20)
CO2: 24 mmol/L (ref 22–32)
Calcium: 9.2 mg/dL (ref 8.9–10.3)
Chloride: 104 mmol/L (ref 98–111)
Creatinine, Ser: 0.77 mg/dL (ref 0.44–1.00)
GFR, Estimated: >60 mL/min (ref >60)
Glucose, Bld: 85 mg/dL (ref 70–99)
Potassium: 3.9 mmol/L (ref 3.5–5.1)
Sodium: 135 mmol/L (ref 135–145)
Total Bilirubin: 0.7 mg/dL (ref 0.3–1.2)
Total Protein: 8 g/dL (ref 6.5–8.1)

## 2022-09-12 LAB — LIPID PANEL
Cholesterol: 212 mg/dL — ABNORMAL HIGH (ref 0–200)
HDL: 44 mg/dL (ref >40)
LDL Cholesterol: 159 mg/dL — ABNORMAL HIGH (ref 0–99)
Total CHOL/HDL Ratio: 4.8 RATIO
Triglycerides: 45 mg/dL (ref <150)
VLDL: 9 mg/dL (ref 0–40)

## 2022-09-12 LAB — IRON AND TIBC
Iron: 79 ug/dL (ref 28–170)
Saturation Ratios: 21 % (ref 10.4–31.8)
TIBC: 384 ug/dL (ref 250–450)
UIBC: 305 ug/dL

## 2022-09-12 LAB — TSH: TSH: 0.669 u[IU]/mL (ref 0.350–4.500)

## 2022-09-12 LAB — HEMOGLOBIN A1C
Hgb A1c MFr Bld: 5.4 % (ref 4.8–5.6)
Mean Plasma Glucose: 108.28 mg/dL

## 2022-09-13 LAB — MISC LABCORP TEST (SEND OUT): Labcorp test code: 146712

## 2022-09-13 LAB — INSULIN AND C-PEPTIDE, SERUM
C-Peptide: 2 ng/mL (ref 1.1–4.4)
Insulin: 10.4 u[IU]/mL (ref 2.6–24.9)

## 2022-09-17 LAB — VITAMIN D 25 HYDROXY (VIT D DEFICIENCY, FRACTURES)

## 2022-12-06 DIAGNOSIS — L280 Lichen simplex chronicus: Secondary | ICD-10-CM | POA: Diagnosis not present

## 2022-12-06 DIAGNOSIS — L538 Other specified erythematous conditions: Secondary | ICD-10-CM | POA: Diagnosis not present

## 2022-12-06 DIAGNOSIS — L728 Other follicular cysts of the skin and subcutaneous tissue: Secondary | ICD-10-CM | POA: Diagnosis not present

## 2022-12-25 DIAGNOSIS — R6882 Decreased libido: Secondary | ICD-10-CM | POA: Diagnosis not present

## 2022-12-25 DIAGNOSIS — Z113 Encounter for screening for infections with a predominantly sexual mode of transmission: Secondary | ICD-10-CM | POA: Diagnosis not present

## 2022-12-25 DIAGNOSIS — Z01419 Encounter for gynecological examination (general) (routine) without abnormal findings: Secondary | ICD-10-CM | POA: Diagnosis not present

## 2022-12-25 DIAGNOSIS — R7302 Impaired glucose tolerance (oral): Secondary | ICD-10-CM | POA: Diagnosis not present

## 2023-01-08 DIAGNOSIS — J301 Allergic rhinitis due to pollen: Secondary | ICD-10-CM | POA: Diagnosis not present

## 2023-01-08 DIAGNOSIS — R0989 Other specified symptoms and signs involving the circulatory and respiratory systems: Secondary | ICD-10-CM | POA: Diagnosis not present

## 2023-01-08 DIAGNOSIS — Z1152 Encounter for screening for COVID-19: Secondary | ICD-10-CM | POA: Diagnosis not present

## 2023-01-12 DIAGNOSIS — J029 Acute pharyngitis, unspecified: Secondary | ICD-10-CM | POA: Diagnosis not present

## 2023-01-12 DIAGNOSIS — J0100 Acute maxillary sinusitis, unspecified: Secondary | ICD-10-CM | POA: Diagnosis not present

## 2023-02-11 DIAGNOSIS — L700 Acne vulgaris: Secondary | ICD-10-CM | POA: Diagnosis not present

## 2023-03-06 DIAGNOSIS — R6882 Decreased libido: Secondary | ICD-10-CM | POA: Diagnosis not present

## 2023-03-19 ENCOUNTER — Encounter: Payer: Self-pay | Admitting: *Deleted

## 2023-03-20 DIAGNOSIS — E7800 Pure hypercholesterolemia, unspecified: Secondary | ICD-10-CM | POA: Diagnosis not present

## 2023-03-20 DIAGNOSIS — Z6833 Body mass index (BMI) 33.0-33.9, adult: Secondary | ICD-10-CM | POA: Diagnosis not present

## 2023-03-27 DIAGNOSIS — F410 Panic disorder [episodic paroxysmal anxiety] without agoraphobia: Secondary | ICD-10-CM | POA: Diagnosis not present

## 2023-05-01 DIAGNOSIS — E7800 Pure hypercholesterolemia, unspecified: Secondary | ICD-10-CM | POA: Diagnosis not present

## 2023-05-01 DIAGNOSIS — Z6835 Body mass index (BMI) 35.0-35.9, adult: Secondary | ICD-10-CM | POA: Diagnosis not present

## 2023-05-08 DIAGNOSIS — J0141 Acute recurrent pansinusitis: Secondary | ICD-10-CM | POA: Diagnosis not present

## 2023-05-08 DIAGNOSIS — J060 Acute laryngopharyngitis: Secondary | ICD-10-CM | POA: Diagnosis not present

## 2023-05-08 DIAGNOSIS — J069 Acute upper respiratory infection, unspecified: Secondary | ICD-10-CM | POA: Diagnosis not present

## 2023-05-17 DIAGNOSIS — F4322 Adjustment disorder with anxiety: Secondary | ICD-10-CM | POA: Diagnosis not present

## 2023-05-17 DIAGNOSIS — F410 Panic disorder [episodic paroxysmal anxiety] without agoraphobia: Secondary | ICD-10-CM | POA: Diagnosis not present

## 2023-05-28 DIAGNOSIS — Z6835 Body mass index (BMI) 35.0-35.9, adult: Secondary | ICD-10-CM | POA: Diagnosis not present

## 2023-05-28 DIAGNOSIS — E7800 Pure hypercholesterolemia, unspecified: Secondary | ICD-10-CM | POA: Diagnosis not present

## 2023-06-17 DIAGNOSIS — U071 COVID-19: Secondary | ICD-10-CM | POA: Diagnosis not present

## 2023-06-17 DIAGNOSIS — R0602 Shortness of breath: Secondary | ICD-10-CM | POA: Diagnosis not present

## 2023-06-17 DIAGNOSIS — R918 Other nonspecific abnormal finding of lung field: Secondary | ICD-10-CM | POA: Diagnosis not present

## 2023-08-12 DIAGNOSIS — F410 Panic disorder [episodic paroxysmal anxiety] without agoraphobia: Secondary | ICD-10-CM | POA: Diagnosis not present

## 2023-08-12 DIAGNOSIS — F419 Anxiety disorder, unspecified: Secondary | ICD-10-CM | POA: Diagnosis not present

## 2023-08-12 DIAGNOSIS — Z79899 Other long term (current) drug therapy: Secondary | ICD-10-CM | POA: Diagnosis not present

## 2024-03-13 ENCOUNTER — Other Ambulatory Visit: Payer: Self-pay

## 2024-03-13 ENCOUNTER — Emergency Department (HOSPITAL_BASED_OUTPATIENT_CLINIC_OR_DEPARTMENT_OTHER): Payer: Self-pay

## 2024-03-13 ENCOUNTER — Encounter (HOSPITAL_BASED_OUTPATIENT_CLINIC_OR_DEPARTMENT_OTHER): Payer: Self-pay | Admitting: Emergency Medicine

## 2024-03-13 ENCOUNTER — Emergency Department (HOSPITAL_BASED_OUTPATIENT_CLINIC_OR_DEPARTMENT_OTHER)
Admission: EM | Admit: 2024-03-13 | Discharge: 2024-03-14 | Disposition: A | Discharge status: Home / Self Care | Payer: Self-pay | Attending: Emergency Medicine | Admitting: Emergency Medicine

## 2024-03-13 DIAGNOSIS — J029 Acute pharyngitis, unspecified: Secondary | ICD-10-CM | POA: Diagnosis not present

## 2024-03-13 DIAGNOSIS — R072 Precordial pain: Secondary | ICD-10-CM | POA: Diagnosis not present

## 2024-03-13 DIAGNOSIS — R002 Palpitations: Secondary | ICD-10-CM | POA: Insufficient documentation

## 2024-03-13 LAB — CBC
HCT: 35 % — ABNORMAL LOW (ref 36.0–46.0)
Hemoglobin: 11.7 g/dL — ABNORMAL LOW (ref 12.0–15.0)
MCH: 28.6 pg (ref 26.0–34.0)
MCHC: 33.4 g/dL (ref 30.0–36.0)
MCV: 85.6 fL (ref 80.0–100.0)
Platelets: 275 10*3/uL (ref 150–400)
RBC: 4.09 MIL/uL (ref 3.87–5.11)
RDW: 12.7 % (ref 11.5–15.5)
WBC: 7.7 10*3/uL (ref 4.0–10.5)
nRBC: 0 % (ref 0.0–0.2)

## 2024-03-13 LAB — BASIC METABOLIC PANEL WITH GFR
Anion gap: 11 (ref 5–15)
BUN: 9 mg/dL (ref 6–20)
CO2: 23 mmol/L (ref 22–32)
Calcium: 8.7 mg/dL — ABNORMAL LOW (ref 8.9–10.3)
Chloride: 102 mmol/L (ref 98–111)
Creatinine, Ser: 0.77 mg/dL (ref 0.44–1.00)
GFR, Estimated: >60 mL/min (ref >60)
Glucose, Bld: 125 mg/dL — ABNORMAL HIGH (ref 70–99)
Potassium: 3.5 mmol/L (ref 3.5–5.1)
Sodium: 136 mmol/L (ref 135–145)

## 2024-03-13 LAB — GROUP A STREP BY PCR: Group A Strep by PCR: NOT DETECTED

## 2024-03-13 LAB — TROPONIN I (HIGH SENSITIVITY)
Troponin I (High Sensitivity): <2 ng/L (ref <18)
Troponin I (High Sensitivity): <2 ng/L (ref <18)

## 2024-03-13 LAB — HCG, SERUM, QUALITATIVE: Preg, Serum: NEGATIVE

## 2024-03-13 NOTE — ED Notes (Signed)
 Called lab to let them know the order was in for the strep swab.

## 2024-03-13 NOTE — ED Triage Notes (Signed)
 Patient complaining of palpitations and chest discomfort for 2 weeks. Denies shortness of breath. Patient also complaining of sore throat x3 days. Denies fevers at home.

## 2024-03-13 NOTE — Discharge Instructions (Addendum)
 Make an appointment to follow-up with a cardiologist.  For the chest pain and the feeling of palpitations.  However cardiac monitoring here has shown no abnormal heart rhythms.  And your cardiac enzymes were very normal.  Which is very reassuring.

## 2024-03-13 NOTE — ED Provider Notes (Signed)
 11:44 PM Assumed care from Dr. Lindle Rhea, please see their note for full history, physical and decision making until this point. In brief this is a 38 y.o. year old female who presented to the ED tonight with Palpitations     CT w/ likely mass, getting CTA.   CTA reassuring. No mass present c/w findings on CXR. No PE. No other associated abnormalitis. Stable for d/c. PCP follow up for anxiety/stress, palpitations if they persist despite propranolol for monitoring.   Discharge instructions, including strict return precautions for new or worsening symptoms, given. Patient and/or family verbalized understanding and agreement with the plan as described.   Labs, studies and imaging reviewed by myself and considered in medical decision making if ordered. Imaging interpreted by radiology.  Labs Reviewed  BASIC METABOLIC PANEL WITH GFR - Abnormal; Notable for the following components:      Result Value   Glucose, Bld 125 (*)    Calcium 8.7 (*)    All other components within normal limits  CBC - Abnormal; Notable for the following components:   Hemoglobin 11.7 (*)    HCT 35.0 (*)    All other components within normal limits  GROUP A STREP BY PCR  HCG, SERUM, QUALITATIVE  TROPONIN I (HIGH SENSITIVITY)  TROPONIN I (HIGH SENSITIVITY)    DG Chest 2 View  Final Result    CT Angio Chest PE W/Cm &/Or Wo Cm    (Results Pending)    No follow-ups on file.    Layni Kreamer, Reymundo Caulk, MD 03/14/24 [phone]

## 2024-03-13 NOTE — ED Provider Notes (Addendum)
 [place] [facility] Provider Note   CSN: [id number] Arrival date & time: 03/13/24  1951     History  Chief Complaint  Patient presents with   Palpitations    [name] [name] is a 38 y.o. female.  Patient with 2-week history of feelings of palpitations sometimes as if the heart is jumping but then today it felt as if it was racing.  Also has had some anterior left anterior chest discomfort for 2 weeks as well.  No real shortness of breath with it.  Just starting 3 days ago started with sore throat and she was concerned about having strep.  No fevers at home.  Temp here 97.8 pulse 89 as respirations 18 oxygen sats 100% on room air which is reassuring blood pressure 141/87.  Past medical history significant for heart murmur as a child without requiring any treatment gestational diabetes.  Patient is never used tobacco products.  No other upper respiratory symptoms other than the sore throat.       Home Medications Prior to Admission medications   Medication Sig Start Date End Date Taking? Authorizing Provider  ibuprofen (ADVIL) 200 MG tablet Take 800 mg by mouth every 6 (six) hours as needed for moderate pain or headache.    [provider]  Multiple Vitamins-Minerals (ONE-A-DAY WOMENS PO) Take 1 tablet by mouth daily.    [provider]  phentermine 15 MG capsule Take 15 mg by mouth daily. 04/11/22   [provider]      Allergies    Patient has no known allergies.    Review of Systems   Review of Systems  Constitutional:  Negative for chills and fever.  HENT:  Positive for sore throat. Negative for ear pain.   Eyes:  Negative for pain and visual disturbance.  Respiratory:  Negative for cough and shortness of breath.   Cardiovascular:  Positive for chest pain and palpitations.  Gastrointestinal:  Negative for abdominal pain and vomiting.  Genitourinary:  Negative for dysuria and hematuria.  Musculoskeletal:  Negative  for arthralgias and back pain.  Skin:  Negative for color change and rash.  Neurological:  Negative for seizures and syncope.  All other systems reviewed and are negative.   Physical Exam Updated Vital Signs BP (!) 141/87 (BP Location: Left Arm)   Pulse 89   Temp 97.8 F (36.6 C)   Resp 18   Ht 1.549 m (5\' 1" )   Wt 90.7 kg   LMP 02/28/2024 (Within Days)   SpO2 100%   BMI 37.79 kg/m  Physical Exam Vitals and nursing note reviewed.  Constitutional:      General: She is not in acute distress.    Appearance: Normal appearance. She is well-developed.  HENT:     Head: Normocephalic and atraumatic.     Mouth/Throat:     Mouth: Mucous membranes are moist.     Pharynx: Posterior oropharyngeal erythema present. No oropharyngeal exudate.  Eyes:     Extraocular Movements: Extraocular movements intact.     Conjunctiva/sclera: Conjunctivae normal.     Pupils: Pupils are equal, round, and reactive to light.  Cardiovascular:     Rate and Rhythm: Normal rate and regular rhythm.     Heart sounds: No murmur heard. Pulmonary:     Effort: Pulmonary effort is normal. No respiratory distress.     Breath sounds: Normal breath sounds.  Chest:     Chest wall: No tenderness.  Abdominal:  Palpations: Abdomen is soft.     Tenderness: There is no abdominal tenderness.  Musculoskeletal:        General: No swelling.     Cervical back: Normal range of motion and neck supple. No rigidity.     Right lower leg: No edema.     Left lower leg: No edema.  Skin:    General: Skin is warm and dry.     Capillary Refill: Capillary refill takes less than 2 seconds.  Neurological:     General: No focal deficit present.     Mental Status: She is alert and oriented to person, place, and time.  Psychiatric:        Mood and Affect: Mood normal.     ED Results / Procedures / Treatments   Labs (all labs ordered are listed, but only abnormal results are displayed) Labs Reviewed  BASIC METABOLIC PANEL  WITH GFR - Abnormal; Notable for the following components:      Result Value   Glucose, Bld 125 (*)    Calcium 8.7 (*)    All other components within normal limits  CBC - Abnormal; Notable for the following components:   Hemoglobin 11.7 (*)    HCT 35.0 (*)    All other components within normal limits  GROUP A STREP BY PCR  HCG, SERUM, QUALITATIVE  TROPONIN I (HIGH SENSITIVITY)  TROPONIN I (HIGH SENSITIVITY)    EKG EKG Interpretation Date/Time:  Friday March 13 2024 20:00:25 EDT Ventricular Rate:  86 PR Interval:  136 QRS Duration:  76 QT Interval:  338 QTC Calculation: 404 R Axis:   73  Text Interpretation: Normal sinus rhythm Normal ECG When compared with ECG of 13-Apr-2022 00:16, PREVIOUS ECG IS PRESENT Confirmed by Vanetta Mulders [phone]) on 03/13/2024 9:15:52 PM  Radiology DG Chest 2 View Result Date: 03/13/2024 CLINICAL DATA:  Chest pain EXAM: CHEST - 2 VIEW COMPARISON:  Chest x-ray 09/08/2016 FINDINGS: Oval nodular density overlies the right upper lobe measuring 2 cm seen only on the frontal view. Lungs are otherwise clear. There is no pleural effusion or pneumothorax. No acute fractures are seen. The cardiomediastinal silhouette is within normal limits. IMPRESSION: Oval nodular density overlies the right upper lobe measuring 2 cm seen only on the frontal view. Findings are indeterminate and may related to external artifact or other pathology in the chest. Please correlate clinically. Recommend further evaluation with chest CT. Electronically Signed   By: Darliss Cheney M.D.   On: 03/13/2024 21:21    Procedures Procedures    Medications Ordered in ED Medications - No data to display  ED Course/ Medical Decision Making/ A&P                                 Medical Decision Making Amount and/or Complexity of Data Reviewed Labs: ordered. Radiology: ordered.   Patient's initial troponin was very reassuring at less than 2.  Particular the symptoms going on all week.   Pregnancy test was negative CBC no leukocytosis hemoglobin 11.7.  Basic metabolic panel normal including renal function.  Chest x-ray though however raise some concerns about right upper lobe density measuring 2 cm.  Based on that was planning to get CT.  With her feeling of palpitations and the chest pain we will do CT angio just to rule out PE at the same time.  EKG without any acute findings. Also will get rapid strep.  CTA is  still pending.  Somewhat of a delay in getting it done.  But patient's rapid strep was negative and repeat troponin was less than 2 so no significant delta troponin changes.  Will refer patient to cardiology.  But we do need some information about the right upper lobe mass.  Cardiac monitoring here without any arrhythmias.   Final Clinical Impression(s) / ED Diagnoses Final diagnoses:  Palpitations  Precordial pain  Sore throat    Rx / DC Orders ED Discharge Orders     None         Vanetta Mulders, MD 03/13/24 2952    Vanetta Mulders, MD 03/13/24 8413    Vanetta Mulders, MD 03/13/24 2440    Vanetta Mulders, MD 03/13/24 2320

## 2024-03-14 MED ORDER — IOHEXOL 350 MG/ML SOLN
75.0000 mL | Freq: Once | INTRAVENOUS | Status: AC | PRN
  Administered 2024-03-14: 75 mL via INTRAVENOUS

## 2024-03-14 MED ORDER — PROPRANOLOL HCL ER 60 MG PO CP24: 60.0000 mg | ORAL_CAPSULE | Freq: Two times a day (BID) | ORAL | 0 refills | Status: AC | PRN

## 2024-03-14 MED ORDER — KETOROLAC TROMETHAMINE 30 MG/ML IJ SOLN
15.0000 mg | Freq: Once | INTRAMUSCULAR | Status: AC
  Administered 2024-03-14: 15 mg via INTRAVENOUS
  Filled 2024-03-14: qty 1

## 2024-03-29 ENCOUNTER — Encounter (HOSPITAL_BASED_OUTPATIENT_CLINIC_OR_DEPARTMENT_OTHER): Payer: Self-pay

## 2024-03-29 ENCOUNTER — Other Ambulatory Visit: Payer: Self-pay

## 2024-03-29 ENCOUNTER — Emergency Department (HOSPITAL_BASED_OUTPATIENT_CLINIC_OR_DEPARTMENT_OTHER)
Admission: EM | Admit: 2024-03-29 | Discharge: 2024-03-29 | Disposition: A | Discharge status: Home / Self Care | Attending: Emergency Medicine | Admitting: Emergency Medicine

## 2024-03-29 DIAGNOSIS — R519 Headache, unspecified: Secondary | ICD-10-CM | POA: Insufficient documentation

## 2024-03-29 LAB — CBC WITH DIFFERENTIAL/PLATELET
Abs Immature Granulocytes: 0.02 10*3/uL (ref 0.00–0.07)
Basophils Absolute: 0 10*3/uL (ref 0.0–0.1)
Basophils Relative: 0 %
Eosinophils Absolute: 0 10*3/uL (ref 0.0–0.5)
Eosinophils Relative: 1 %
HCT: 37.2 % (ref 36.0–46.0)
Hemoglobin: 12.4 g/dL (ref 12.0–15.0)
Immature Granulocytes: 0 %
Lymphocytes Relative: 23 %
Lymphs Abs: 1.5 10*3/uL (ref 0.7–4.0)
MCH: 28.5 pg (ref 26.0–34.0)
MCHC: 33.3 g/dL (ref 30.0–36.0)
MCV: 85.5 fL (ref 80.0–100.0)
Monocytes Absolute: 0.6 10*3/uL (ref 0.1–1.0)
Monocytes Relative: 9 %
Neutro Abs: 4.2 10*3/uL (ref 1.7–7.7)
Neutrophils Relative %: 67 %
Platelets: 311 10*3/uL (ref 150–400)
RBC: 4.35 MIL/uL (ref 3.87–5.11)
RDW: 12.7 % (ref 11.5–15.5)
WBC: 6.4 10*3/uL (ref 4.0–10.5)
nRBC: 0 % (ref 0.0–0.2)

## 2024-03-29 LAB — BASIC METABOLIC PANEL WITH GFR
Anion gap: 14 (ref 5–15)
BUN: 10 mg/dL (ref 6–20)
CO2: 23 mmol/L (ref 22–32)
Calcium: 9.3 mg/dL (ref 8.9–10.3)
Chloride: 101 mmol/L (ref 98–111)
Creatinine, Ser: 0.93 mg/dL (ref 0.44–1.00)
GFR, Estimated: >60 mL/min (ref >60)
Glucose, Bld: 103 mg/dL — ABNORMAL HIGH (ref 70–99)
Potassium: 4 mmol/L (ref 3.5–5.1)
Sodium: 139 mmol/L (ref 135–145)

## 2024-03-29 MED ORDER — DEXAMETHASONE SODIUM PHOSPHATE 10 MG/ML IJ SOLN
10.0000 mg | Freq: Once | INTRAMUSCULAR | Status: AC
  Administered 2024-03-29: 10 mg via INTRAVENOUS
  Filled 2024-03-29: qty 1

## 2024-03-29 MED ORDER — DIPHENHYDRAMINE HCL 50 MG/ML IJ SOLN
25.0000 mg | Freq: Once | INTRAMUSCULAR | Status: AC
  Administered 2024-03-29: 25 mg via INTRAVENOUS
  Filled 2024-03-29: qty 1

## 2024-03-29 MED ORDER — SODIUM CHLORIDE 0.9 % IV BOLUS
1000.0000 mL | Freq: Once | INTRAVENOUS | Status: AC
  Administered 2024-03-29: 1000 mL via INTRAVENOUS

## 2024-03-29 MED ORDER — PROCHLORPERAZINE EDISYLATE 10 MG/2ML IJ SOLN
10.0000 mg | Freq: Once | INTRAMUSCULAR | Status: AC
  Administered 2024-03-29: 10 mg via INTRAVENOUS
  Filled 2024-03-29: qty 2

## 2024-03-29 NOTE — ED Notes (Signed)

## 2024-03-29 NOTE — Discharge Instructions (Addendum)
 If your headache comes back, try applying ice to the painful areas.  You may also take ibuprofen  and/or acetaminophen  as needed.

## 2024-03-29 NOTE — ED Triage Notes (Signed)
 Pt arrived from home via POV c/o headache x a few days. 8/10 on pain scale. Pt states that she took Rzatriptan odt 10mg  for the headache as prescribed for headaches. Pt states that it did not help the headache, but feels very weak after taking the medication and feels like she has something stuck in her throat after taking the medication

## 2024-03-29 NOTE — ED Provider Notes (Signed)
 [place] [facility] Provider Note   CSN: [id number] Arrival date & time: 03/29/24  0219     History  Chief Complaint  Patient presents with   Headache   Medication Reaction    [name] [name] is a 38 y.o. female.  The history is provided by the patient.  Headache She complains of a bifrontal headache which has been waxing and waning for the last 3 days.  She denies vision change, nausea, vomiting.  She denies photophobia and phonophobia.  She took naproxen  without relief.  Tonight she took a dose of rizatriptan which has not helped her headache, but she is now feeling weak and feels like her heart is racing and that her tongue is swelling.   Home Medications Prior to Admission medications   Medication Sig Start Date End Date Taking? Authorizing Provider  ibuprofen  (ADVIL ) 200 MG tablet Take 800 mg by mouth every 6 (six) hours as needed for moderate pain or headache.    [provider]  Multiple Vitamins-Minerals (ONE-A-DAY WOMENS PO) Take 1 tablet by mouth daily.    [provider]  phentermine 15 MG capsule Take 15 mg by mouth daily. 04/11/22   [provider]  propranolol  ER (INDERAL  LA) 60 MG 24 hr capsule Take 1 capsule (60 mg total) by mouth 2 (two) times daily as needed (palpitations). 03/14/24   Mesner, Reymundo Caulk, MD      Allergies    Patient has no known allergies.    Review of Systems   Review of Systems  Neurological:  Positive for headaches.  All other systems reviewed and are negative.   Physical Exam Updated Vital Signs BP 101/77   Pulse 86   Temp 98.5 F (36.9 C) (Oral)   Resp 18   Ht 5\' 1"  (1.549 m)   Wt 88.9 kg   LMP 02/28/2024 (Within Days)   SpO2 99%   Breastfeeding No   BMI 37.03 kg/m  Physical Exam Vitals reviewed.   38 year old female, resting comfortably and in no acute distress. Vital signs are normal. Oxygen saturation is 99%, which is normal. Head is normocephalic and atraumatic.  PERRLA, EOMI. Oropharynx is clear-no swelling seen within the oral cavity.  There is mild tenderness to palpation over the left temporalis muscle, and moderate tenderness palpation over the insertion of the left paracervical muscles. Neck is nontender and supple Lungs are clear without rales, wheezes, or rhonchi. Chest is nontender. Heart has regular rate and rhythm without murmur. Abdomen is soft, flat, nontender. Skin is warm and dry without rash. Neurologic: Mental status is normal, cranial nerves are intact, strength is 5/5 in all 4 extremities.  ED Results / Procedures / Treatments   Labs (all labs ordered are listed, but only abnormal results are displayed) Labs Reviewed  BASIC METABOLIC PANEL WITH GFR - Abnormal; Notable for the following components:      Result Value   Glucose, Bld 103 (*)    All other components within normal limits  CBC WITH DIFFERENTIAL/PLATELET   Procedures Procedures    Medications Ordered in ED Medications  sodium chloride  0.9 % bolus 1,000 mL (1,000 mLs Intravenous New Bag/Given 03/29/24 0513)  prochlorperazine  (COMPAZINE ) injection 10 mg (10 mg Intravenous Given 03/29/24 0525)  diphenhydrAMINE  (BENADRYL ) injection 25 mg (25 mg Intravenous Given 03/29/24 0524)  dexamethasone (DECADRON) injection 10 mg (10 mg Intravenous Given 03/29/24 0524)    ED Course/ Medical Decision Making/ A&P  Medical Decision Making Amount and/or Complexity of Data Reviewed Labs: ordered.  Risk Prescription drug management.   Headache which seems most likely to be a muscle contraction headache based on exam.  Differential diagnosis also includes migraine variant such as cluster headache, classic migraine headache.  No red flags to suggest serious pathology such as subarachnoid hemorrhage or meningitis.  Possible side effect to rizatriptan although symptoms are nonspecific.  I have ordered laboratory workup of CBC and basic metabolic panel.   I have ordered migraine cocktail of normal saline solution, prochlorperazine , diphenhydramine , dexamethasone.  Reviewed her past records, and she did have an ED visit on 04/13/2022 for migraine headache.  She feels much better following above-noted treatment.  I have reviewed her laboratory tests, and my interpretation is mildly elevated random glucose level and otherwise normal CBC and metabolic panel.  I am discharging her with instructions to use acetaminophen  as needed for pain, also apply ice to sore areas as needed.  Final Clinical Impression(s) / ED Diagnoses Final diagnoses:  Bad headache    Rx / DC Orders ED Discharge Orders     None         Alissa April, MD 03/29/24 [phone]

## 2024-04-25 ENCOUNTER — Other Ambulatory Visit: Payer: Self-pay

## 2024-04-25 ENCOUNTER — Emergency Department (HOSPITAL_BASED_OUTPATIENT_CLINIC_OR_DEPARTMENT_OTHER): Admission: EM | Admit: 2024-04-25 | Discharge: 2024-04-25 | Disposition: A | Discharge status: Home / Self Care

## 2024-04-25 ENCOUNTER — Encounter (HOSPITAL_BASED_OUTPATIENT_CLINIC_OR_DEPARTMENT_OTHER): Payer: Self-pay | Admitting: Emergency Medicine

## 2024-04-25 DIAGNOSIS — M25512 Pain in left shoulder: Secondary | ICD-10-CM | POA: Insufficient documentation

## 2024-04-25 MED ORDER — PREDNISONE 20 MG PO TABS: 40.0000 mg | ORAL_TABLET | Freq: Every day | ORAL | 0 refills | Status: AC

## 2024-04-25 MED ORDER — METHYLPREDNISOLONE SODIUM SUCC 125 MG IJ SOLR
125.0000 mg | Freq: Once | INTRAMUSCULAR | Status: AC
  Administered 2024-04-25: 125 mg via INTRAMUSCULAR
  Filled 2024-04-25: qty 2

## 2024-04-25 NOTE — Discharge Instructions (Addendum)
 As discussed, take prednisone  40 mg for the next 5 days.  You given a shot of steroids in the ED today.  You can start this medication tomorrow.  I have provided information for orthopedics follow-up with if pain persist.  Get help right away if: Your arm, hand, or fingers turn white or blue.

## 2024-04-25 NOTE — ED Triage Notes (Signed)
 Pt reports LT shoulder pain since Fri a.m.- was seen at [facility] and had XR/received Toradol  injection per pt which did not help; no injury; decreased ROM d/t pain

## 2024-04-25 NOTE — ED Notes (Signed)

## 2024-04-25 NOTE — ED Provider Notes (Signed)
 [place] [facility] Provider Note   CSN: [id number] Arrival date & time: 04/25/24  1730     History  Chief Complaint  Patient presents with   Shoulder Pain    [name] [name] is a 38 y.o. female with no significant past medical history presents the ED today for shoulder pain.  Patient reports left shoulder pain that began yesterday morning when she woke up.  States that she is unable to move her elbow secondary to pain.  Was seen in urgent care yesterday and was given a shot of Toradol  and pain medication.  States that she was unable to get the pain medication until today but reports that the Toradol  did not help.  She had an x-ray then that was reassuring but is here to follow-up for the pain.    Home Medications Prior to Admission medications   Medication Sig Start Date End Date Taking? Authorizing Provider  predniSONE  (DELTASONE ) 20 MG tablet Take 2 tablets (40 mg total) by mouth daily for 5 days. 04/26/24 05/01/24 Yes Sonnie Dusky, PA-C  ibuprofen  (ADVIL ) 200 MG tablet Take 800 mg by mouth every 6 (six) hours as needed for moderate pain or headache.    [provider]  Multiple Vitamins-Minerals (ONE-A-DAY WOMENS PO) Take 1 tablet by mouth daily.    [provider]  phentermine 15 MG capsule Take 15 mg by mouth daily. 04/11/22   [provider]  propranolol  ER (INDERAL  LA) 60 MG 24 hr capsule Take 1 capsule (60 mg total) by mouth 2 (two) times daily as needed (palpitations). 03/14/24   Mesner, Reymundo Caulk, MD      Allergies    Patient has no known allergies.    Review of Systems   Review of Systems  Musculoskeletal:  Positive for arthralgias.  All other systems reviewed and are negative.   Physical Exam Updated Vital Signs BP 122/78 (BP Location: Right Arm)   Pulse 100   Temp 98.9 F (37.2 C)   Resp 18   Ht 5\' 1"  (1.549 m)   Wt 88.9 kg   SpO2 98%   BMI 37.03 kg/m  Physical Exam Vitals and nursing note reviewed.   Constitutional:      General: She is not in acute distress.    Appearance: Normal appearance.  HENT:     Head: Normocephalic and atraumatic.     Mouth/Throat:     Mouth: Mucous membranes are moist.  Eyes:     Conjunctiva/sclera: Conjunctivae normal.     Pupils: Pupils are equal, round, and reactive to light.  Cardiovascular:     Rate and Rhythm: Normal rate and regular rhythm.     Pulses: Normal pulses.  Pulmonary:     Effort: Pulmonary effort is normal.     Breath sounds: Normal breath sounds.  Abdominal:     Palpations: Abdomen is soft.     Tenderness: There is no abdominal tenderness.  Musculoskeletal:        General: Tenderness present. Normal range of motion.     Cervical back: Normal range of motion.     Comments: Tenderness to palpation of the anterior and posterior left shoulder joint without erythema or swelling. ROM impaired second to pain.  Range of pressure is unremarkable.  Skin:    General: Skin is warm and dry.     Findings: No rash.  Neurological:     General: No focal deficit present.     Mental Status: She is alert.  Psychiatric:        Mood and Affect: Mood normal.        Behavior: Behavior normal.     ED Results / Procedures / Treatments   Labs (all labs ordered are listed, but only abnormal results are displayed) Labs Reviewed - No data to display  EKG None  Radiology No results found.  Procedures Procedures    Medications Ordered in ED Medications  methylPREDNISolone sodium succinate (SOLU-MEDROL) 125 mg/2 mL injection 125 mg (125 mg Intramuscular Given 04/25/24 1846)    ED Course/ Medical Decision Making/ A&P                                 Medical Decision Making Risk Prescription drug management.   This patient presents to the ED for concern of shoulder pain, this involves an extensive number of treatment options, and is a complaint that carries with it a high risk of complications and morbidity.   Differential diagnosis  includes: Ligamentous injury, contusion, hematoma, radiculopathy, etc.   Comorbidities  No significant past medical history   Additional History  Additional history obtained from recent urgent care note   Problem List / ED Course / Critical Interventions / Medication Management  Patient was seen at the urgent care yesterday after waking up with left shoulder pain, diagnosed with adhesive capsulitis.  Was given pain medication to take vies to take anti-inflammatories as well.  States that she was unable to get the pain medication until today if the pain has persisted. Since x-ray x-ray was normal did not show any osseous abnormalities.  Will give her IM Solu-Medrol and start her prednisone  to help with symptoms. Patient is agreeable with this plan.   Social Determinants of Health  Access to healthcare   Test / Admission - Considered  Discussed findings with patient.  All questions were answered. She is stable and safe for discharge home. Return precautions given.       Final Clinical Impression(s) / ED Diagnoses Final diagnoses:  Acute pain of left shoulder    Rx / DC Orders ED Discharge Orders          Ordered    predniSONE  (DELTASONE ) 20 MG tablet  Daily        04/25/24 1831              Sonnie Dusky, PA-C 04/25/24 2209    Rolinda Climes, DO 04/25/24 2342

## 2024-07-06 NOTE — Progress Notes (Deleted)
 Cardiology Office Note:   Date:  07/06/2024  ID:  [name] [name], DOB 1986/07/02, MRN [id number] PCP:  [name], [name], PA-C  [facility] Providers Cardiologist:  Wendel Haws, MD Referring MD: [name], [name], *  Chief Complaint/Reason for Referral: Chest pain, palpitations ASSESSMENT:    1. Precordial pain   2. Palpitations   3. BMI 37.0-37.9, adult     PLAN:   In order of problems listed above: Chest pain:*** Palpitations: Will obtain echocardiogram, monitor, and reflex TSH.  Sleep study?*** Elevated BMI: Hemoglobin A1c was normal a year ago; continue diet and exercise modification.        {Are you ordering a CV Procedure (e.g. stress test, cath, DCCV, TEE, etc)?   Press F2        :[id number]}   Dispo:  No follow-ups on file.       I spent *** minutes reviewing all clinical data during and prior to this visit including all relevant imaging studies, laboratories, clinical information from other health systems and prior notes from both Cardiology and other specialties, interviewing the patient, conducting a complete physical examination, and coordinating care in order to formulate a comprehensive and personalized evaluation and treatment plan.   History of Present Illness:    FOCUSED PROBLEM LIST:   Depression/anxiety Palpitations No arrhythmias, 30-day monitor 2017 Normal diastolic function, no valve problems, EF 60 to 65% TTE 2017 BMI 37  August 2025:  Patient consents to use of AI scribe. The patient is referred for recommendations regarding palpitations and shoulder/chest pain.  She first presented to urgent care in April due to shoulder pain.  Her troponins were negative.  A PE protocol CT scan was negative.  She then presented a few weeks later with a headache which did not respond to rizatriptan.  She was given Benadryl , steroids, and prochlorperazine .  She then presented about a month later with chest and shoulder pain.  She noted that she could not move  her elbow.  The area was tender to palpation.  She was treated with steroid injection and discharged home.  She is here for recommendations regarding chest pain and palpitations.  She had previously been seen in 2017 due to palpitations.  She had a reassuring evaluation including 30-day monitor with notes arrhythmias detected and a normal echocardiogram.    Current Medications: No outpatient medications have been marked as taking for the 07/13/24 encounter (Appointment) with Taffie Eckmann K, MD.     Review of Systems:   Please see the history of present illness.    All other systems reviewed and are negative.     EKGs/Labs/Other Test Reviewed:   EKG: April 2025 normal sinus rhythm  EKG Interpretation Date/Time:    Ventricular Rate:    PR Interval:    QRS Duration:    QT Interval:    QTC Calculation:   R Axis:      Text Interpretation:          CARDIAC STUDIES: Refer to CV Procedures and Imaging Tabs   Risk Assessment/Calculations:   {Does this patient have ATRIAL FIBRILLATION?[phone]}      Physical Exam:   VS:  There were no vitals taken for this visit.   No BP recorded.  {Refresh Note OR Click here to enter BP  :1}***   Wt Readings from Last 3 Encounters:  04/25/24 196 lb (88.9 kg)  03/29/24 196 lb (88.9 kg)  03/13/24 200 lb (90.7 kg)      GENERAL:  No apparent  distress, AOx3 HEENT:  No carotid bruits, +2 carotid impulses, no scleral icterus CAR: RRR Irregular RR*** no murmurs***, gallops, rubs, or thrills RES:  Clear to auscultation bilaterally ABD:  Soft, nontender, nondistended, positive bowel sounds x 4 VASC:  +2 radial pulses, +2 carotid pulses NEURO:  CN 2-12 grossly intact; motor and sensory grossly intact PSYCH:  No active depression or anxiety EXT:  No edema, ecchymosis, or cyanosis  Signed, Lurena MARLA Red, MD  07/06/2024 8:57 AM    [facility] [address] [place], [place], [place]  [address] Phone: [phone]; Fax: [phone]   Note:  This document was prepared using Dragon voice recognition software and may include unintentional dictation errors.

## 2024-07-13 ENCOUNTER — Ambulatory Visit: Attending: Internal Medicine | Admitting: Internal Medicine

## 2024-07-13 DIAGNOSIS — R072 Precordial pain: Secondary | ICD-10-CM

## 2024-07-13 DIAGNOSIS — Z6837 Body mass index (BMI) 37.0-37.9, adult: Secondary | ICD-10-CM

## 2024-07-13 DIAGNOSIS — R002 Palpitations: Secondary | ICD-10-CM

## 2024-10-27 ENCOUNTER — Encounter (HOSPITAL_BASED_OUTPATIENT_CLINIC_OR_DEPARTMENT_OTHER): Payer: Self-pay | Admitting: Emergency Medicine

## 2024-10-27 ENCOUNTER — Emergency Department (HOSPITAL_BASED_OUTPATIENT_CLINIC_OR_DEPARTMENT_OTHER)
Admission: EM | Admit: 2024-10-27 | Discharge: 2024-10-27 | Disposition: A | Discharge status: Home / Self Care | Payer: Self-pay | Attending: Emergency Medicine | Admitting: Emergency Medicine

## 2024-10-27 ENCOUNTER — Other Ambulatory Visit: Payer: Self-pay

## 2024-10-27 ENCOUNTER — Emergency Department (HOSPITAL_BASED_OUTPATIENT_CLINIC_OR_DEPARTMENT_OTHER): Payer: Self-pay

## 2024-10-27 DIAGNOSIS — Z79899 Other long term (current) drug therapy: Secondary | ICD-10-CM | POA: Insufficient documentation

## 2024-10-27 DIAGNOSIS — R079 Chest pain, unspecified: Secondary | ICD-10-CM | POA: Insufficient documentation

## 2024-10-27 DIAGNOSIS — R42 Dizziness and giddiness: Secondary | ICD-10-CM | POA: Insufficient documentation

## 2024-10-27 DIAGNOSIS — I10 Essential (primary) hypertension: Secondary | ICD-10-CM | POA: Insufficient documentation

## 2024-10-27 DIAGNOSIS — H8190 Unspecified disorder of vestibular function, unspecified ear: Secondary | ICD-10-CM

## 2024-10-27 DIAGNOSIS — E119 Type 2 diabetes mellitus without complications: Secondary | ICD-10-CM | POA: Insufficient documentation

## 2024-10-27 DIAGNOSIS — R062 Wheezing: Secondary | ICD-10-CM | POA: Insufficient documentation

## 2024-10-27 LAB — PREGNANCY, URINE: Preg Test, Ur: NEGATIVE

## 2024-10-27 LAB — CBC
HCT: 35.8 % — ABNORMAL LOW (ref 36.0–46.0)
Hemoglobin: 12 g/dL (ref 12.0–15.0)
MCH: 28.2 pg (ref 26.0–34.0)
MCHC: 33.5 g/dL (ref 30.0–36.0)
MCV: 84 fL (ref 80.0–100.0)
Platelets: 305 K/uL (ref 150–400)
RBC: 4.26 MIL/uL (ref 3.87–5.11)
RDW: 12.8 % (ref 11.5–15.5)
WBC: 7.2 K/uL (ref 4.0–10.5)
nRBC: 0 % (ref 0.0–0.2)

## 2024-10-27 LAB — BASIC METABOLIC PANEL WITH GFR
Anion gap: 16 — ABNORMAL HIGH (ref 5–15)
BUN: 7 mg/dL (ref 6–20)
CO2: 21 mmol/L — ABNORMAL LOW (ref 22–32)
Calcium: 9.4 mg/dL (ref 8.9–10.3)
Chloride: 103 mmol/L (ref 98–111)
Creatinine, Ser: 0.93 mg/dL (ref 0.44–1.00)
GFR, Estimated: >60 mL/min (ref >60)
Glucose, Bld: 110 mg/dL — ABNORMAL HIGH (ref 70–99)
Potassium: 4.1 mmol/L (ref 3.5–5.1)
Sodium: 140 mmol/L (ref 135–145)

## 2024-10-27 LAB — TROPONIN T, HIGH SENSITIVITY
Troponin T High Sensitivity: <15 ng/L (ref 0–19)
Troponin T High Sensitivity: <15 ng/L (ref 0–19)

## 2024-10-27 MED ORDER — MECLIZINE HCL 25 MG PO TABS: 25.0000 mg | ORAL_TABLET | Freq: Three times a day (TID) | ORAL | 0 refills | Status: AC | PRN

## 2024-10-27 MED ORDER — MECLIZINE HCL 25 MG PO TABS
25.0000 mg | ORAL_TABLET | Freq: Once | ORAL | Status: AC
  Administered 2024-10-27: 25 mg via ORAL
  Filled 2024-10-27: qty 1

## 2024-10-27 MED ORDER — LACTATED RINGERS IV BOLUS
1000.0000 mL | Freq: Once | INTRAVENOUS | Status: AC
  Administered 2024-10-27: 1000 mL via INTRAVENOUS

## 2024-10-27 NOTE — ED Notes (Signed)
Pt ambulatory to bathroom for urine sample.

## 2024-10-27 NOTE — ED Notes (Signed)
 Pt will need redraw of lavendar top

## 2024-10-27 NOTE — Discharge Instructions (Signed)
 I suspect that the cause of your vertigo/room spinning sensation is your inner ear.  Please follow-up with the ear nose and throat doctor have given you a prescription for meclizine  to use as prescribed as needed.

## 2024-10-27 NOTE — ED Triage Notes (Signed)
 Pt c/o feeling dizziness (room spinning) x 3 days, elevated bp, wheezing x 3 weeks. Also chest pain today appx 1230. Denies n/v/diaphoresis.   Denies hx of asthma, smoking, known illnessm, changes in medications.

## 2024-10-27 NOTE — ED Provider Notes (Signed)
 [facility] Provider Note   CSN: [id number] Arrival date & time: 10/27/24  1439     Patient presents with: Dizziness, Hypertension, and Wheezing   [name] [name] is a 38 y.o. female.    Dizziness Hypertension  Wheezing  Patient is a 38 year old female with a past medical history significant for heart murmur, diabetes, headache  She presents emergency room today with complaints of episodic vertigo over the past few days seems to happen with head movements.  It is not constant not presently occurring.  She states that she felt a moment of chest discomfort/pressure around noon today but this is completely resolved and only lasted for a moment.  No nausea vomiting diarrhea pheresis or shortness of breath. No other associate symptoms.  No numbness weakness slurred speech confusion no head injuries.      Prior to Admission medications   Medication Sig Start Date End Date Taking? Authorizing Provider  ibuprofen  (ADVIL ) 200 MG tablet Take 800 mg by mouth every 6 (six) hours as needed for moderate pain or headache.    [provider]  Multiple Vitamins-Minerals (ONE-A-DAY WOMENS PO) Take 1 tablet by mouth daily.    [provider]  phentermine 15 MG capsule Take 15 mg by mouth daily. 04/11/22   [provider]  propranolol  ER (INDERAL  LA) 60 MG 24 hr capsule Take 1 capsule (60 mg total) by mouth 2 (two) times daily as needed (palpitations). 03/14/24   Mesner, Selinda, MD    Allergies: Patient has no known allergies.    Review of Systems  Respiratory:  Positive for wheezing.   Neurological:  Positive for dizziness.    Updated Vital Signs BP (!) 140/82   Pulse 92   Temp 98.6 F (37 C) (Oral)   Resp 19   Ht 5' 1 (1.549 m)   Wt 89.8 kg   LMP 10/23/2024 (Approximate)   SpO2 100%   BMI 37.41 kg/m   Physical Exam Vitals and nursing note reviewed.  Constitutional:      General: She is not in acute  distress.    Comments: Pleasant well-appearing 38 year old.  In no acute distress.  Sitting comfortably in bed.  Able answer questions appropriately follow commands. No increased work of breathing. Speaking in full sentences.   HENT:     Head: Normocephalic and atraumatic.     Nose: Nose normal.  Eyes:     General: No scleral icterus. Cardiovascular:     Rate and Rhythm: Normal rate and regular rhythm.     Pulses: Normal pulses.     Heart sounds: Normal heart sounds.  Pulmonary:     Effort: Pulmonary effort is normal. No respiratory distress.     Breath sounds: No wheezing.  Abdominal:     Palpations: Abdomen is soft.     Tenderness: There is no abdominal tenderness. There is no guarding or rebound.  Musculoskeletal:     Cervical back: Normal range of motion.     Right lower leg: No edema.     Left lower leg: No edema.  Skin:    General: Skin is warm and dry.     Capillary Refill: Capillary refill takes less than 2 seconds.  Neurological:     Mental Status: She is alert. Mental status is at baseline.     Comments: Alert and oriented to self, place, time and event.   Speech is fluent, clear without dysarthria or dysphasia.   Strength 5/5 in upper/lower  extremities   Sensation intact in upper/lower extremities   CN I not tested  CN II grossly intact visual fields bilaterally. Did not visualize posterior eye.  CN III, IV, VI PERRLA and EOMs intact bilaterally  CN V Intact sensation to sharp and light touch to the face  CN VII facial movements symmetric  CN VIII not tested  CN IX, X no uvula deviation, symmetric rise of soft palate  CN XI 5/5 SCM and trapezius strength bilaterally  CN XII Midline tongue protrusion, symmetric L/R movements     Psychiatric:        Mood and Affect: Mood normal.        Behavior: Behavior normal.     (all labs ordered are listed, but only abnormal results are displayed) Labs Reviewed  BASIC METABOLIC PANEL WITH GFR - Abnormal; Notable for  the following components:      Result Value   CO2 21 (*)    Glucose, Bld 110 (*)    Anion gap 16 (*)    All other components within normal limits  PREGNANCY, URINE  CBC  TROPONIN T, HIGH SENSITIVITY  TROPONIN T, HIGH SENSITIVITY    EKG: EKG Interpretation Date/Time:  Tuesday October 27 2024 14:54:23 EST Ventricular Rate:  96 PR Interval:  134 QRS Duration:  74 QT Interval:  330 QTC Calculation: 417 R Axis:   60  Text Interpretation: Sinus rhythm Nonspecific T wave abnormality Confirmed by Bernard Drivers ([id number]) on 10/27/2024 2:56:29 PM  Radiology: DG Chest 2 View Result Date: 10/27/2024 CLINICAL DATA:  Chest pain with dizziness and elevated blood pressure for 3 days. Wheezing for 3 weeks. EXAM: CHEST - 2 VIEW COMPARISON:  Radiographs 03/13/2024.  CT 03/13/2024. FINDINGS: The heart size and mediastinal contours are normal. The lungs are clear. There is no pleural effusion or pneumothorax. No acute osseous findings are identified. Mild convex right scoliosis. IMPRESSION: No active cardiopulmonary process. Electronically Signed   By: Elsie Perone M.D.   On: 10/27/2024 16:10     Procedures   Medications Ordered in the ED  lactated ringers  bolus 1,000 mL (has no administration in time range)  meclizine  (ANTIVERT ) tablet 25 mg (has no administration in time range)                                    Medical Decision Making Amount and/or Complexity of Data Reviewed Labs: ordered. Radiology: ordered.   Patient is a 38 year old female with a past medical history significant for heart murmur, diabetes, headache  She presents emergency room today with complaints of episodic vertigo over the past few days seems to happen with head movements.  It is not constant not presently occurring.  She states that she felt a moment of chest discomfort/pressure around noon today but this is completely resolved and only lasted for a moment.  No nausea vomiting diarrhea pheresis or shortness  of breath. No other associate symptoms.  No numbness weakness slurred speech confusion no head injuries.  I personally reviewed all laboratory work and imaging.  Metabolic panel without any acute abnormality specifically kidney function within normal limits and no significant electrolyte abnormalities. CBC without leukocytosis or significant anemia.  Labs overall reassuring.  Urine pregnancy negative.  Suspect BPPV given her symptoms.  I specifically doubt MS, posterior circulation stroke, press, acoustic neuroma or other central cause of patient's vertigo.  She is asymptomatic after 1 L of LR and  25 mg of Antivert .  Discharged her home with Antivert  and recommended ENT follow-up.  Return precautions emergency room provided to patient.  Final diagnoses:  Episodic recurrent vertigo    ED Discharge Orders          Ordered    meclizine  (ANTIVERT ) 25 MG tablet  3 times daily PRN        10/27/24 1926               Neldon Hamp RAMAN, [place] 10/27/24 [id number]    Dreama Longs, MD 10/28/24 [phone]

## 2024-10-27 NOTE — ED Notes (Signed)
 Pt OOB, not feeling dizzy at this time. Discharge instructions reviewed w/ pt. Instructed to set up appt w ENT and take meclizine  as needed for dizziness at home. Pt verbalized understanding.
